# Patient Record
Sex: Female | Born: 1970
Health system: Southern US, Community
[De-identification: ages and names within clinical notes are randomized; demographics above are authoritative.]

## PROBLEM LIST (undated history)

## (undated) ENCOUNTER — Emergency Department (HOSPITAL_COMMUNITY): Payer: 59

## (undated) DIAGNOSIS — M797 Fibromyalgia: Secondary | ICD-10-CM

## (undated) DIAGNOSIS — Z9889 Other specified postprocedural states: Secondary | ICD-10-CM

## (undated) DIAGNOSIS — I10 Essential (primary) hypertension: Secondary | ICD-10-CM

## (undated) DIAGNOSIS — R011 Cardiac murmur, unspecified: Secondary | ICD-10-CM

## (undated) DIAGNOSIS — R87629 Unspecified abnormal cytological findings in specimens from vagina: Secondary | ICD-10-CM

## (undated) DIAGNOSIS — T8859XA Other complications of anesthesia, initial encounter: Secondary | ICD-10-CM

## (undated) DIAGNOSIS — N83209 Unspecified ovarian cyst, unspecified side: Secondary | ICD-10-CM

## (undated) HISTORY — DX: Essential (primary) hypertension: I10

## (undated) HISTORY — PX: CYST EXCISION: SHX5701

## (undated) HISTORY — DX: Unspecified abnormal cytological findings in specimens from vagina: R87.629

## (undated) HISTORY — DX: Unspecified ovarian cyst, unspecified side: N83.209

## (undated) HISTORY — PX: SALPINGECTOMY: SHX328

## (undated) HISTORY — PX: ARTHROSCOPY WITH ANTERIOR CRUCIATE LIGAMENT (ACL) REPAIR WITH ANTERIOR TIBILIAS GRAFT: SHX6503

---

## 2014-10-05 DIAGNOSIS — L80 Vitiligo: Secondary | ICD-10-CM | POA: Insufficient documentation

## 2014-10-05 DIAGNOSIS — I10 Essential (primary) hypertension: Secondary | ICD-10-CM | POA: Insufficient documentation

## 2015-03-11 ENCOUNTER — Encounter (HOSPITAL_COMMUNITY): Payer: Self-pay | Admitting: *Deleted

## 2015-03-11 ENCOUNTER — Inpatient Hospital Stay (HOSPITAL_COMMUNITY)
Admission: AD | Admit: 2015-03-11 | Discharge: 2015-03-11 | Disposition: A | Discharge status: Home / Self Care | Payer: 59 | Source: Ambulatory Visit | Attending: Obstetrics & Gynecology | Admitting: Obstetrics & Gynecology

## 2015-03-11 DIAGNOSIS — N926 Irregular menstruation, unspecified: Secondary | ICD-10-CM | POA: Insufficient documentation

## 2015-03-11 DIAGNOSIS — R109 Unspecified abdominal pain: Secondary | ICD-10-CM | POA: Insufficient documentation

## 2015-03-11 HISTORY — DX: Fibromyalgia: M79.7

## 2015-03-11 LAB — URINALYSIS, ROUTINE W REFLEX MICROSCOPIC
BILIRUBIN URINE: NEGATIVE
Glucose, UA: NEGATIVE mg/dL
Hgb urine dipstick: NEGATIVE
Ketones, ur: 15 mg/dL — AB
Leukocytes, UA: NEGATIVE
NITRITE: NEGATIVE
PH: 6 (ref 5.0–8.0)
Protein, ur: NEGATIVE mg/dL

## 2015-03-11 LAB — POCT PREGNANCY, URINE: Preg Test, Ur: NEGATIVE

## 2015-03-11 MED ORDER — IBUPROFEN 600 MG PO TABS
600.0000 mg | ORAL_TABLET | Freq: Once | ORAL | Status: AC
  Administered 2015-03-11: 600 mg via ORAL
  Filled 2015-03-11: qty 1

## 2015-03-11 NOTE — MAU Provider Note (Signed)
History     CSN: [id number]  Arrival date and time: 03/11/15 1105   First Provider Initiated Contact with Patient 03/11/15 1152      Chief Complaint  Patient presents with  . Flank Pain   HPI   [name] is a 44 y.o. female [phone] with a history of ovarian cysts presents to MAU with left sided "ovarian pain"; the pain has been there for 3-4 months. She was seen by her primary care dr. Three weeks ago and started on birth control pills. The pain is not worsening, however the pain is still present and she feels at this time she needs an [place].   She has not taken anything for pain today, and normally does not like to take pain medication.   Patient is requesting referral to [facility] for follow up.   Patient declines STI testing today.   OB History    Gravida Para Term Preterm AB TAB SAB Ectopic Multiple Living   3 2   1   1  2       Past Medical History  Diagnosis Date  . Fibromyalgia     Past Surgical History  Procedure Laterality Date  . Cyst excision    . Salpingectomy      History reviewed. No pertinent family history.  Social History  Substance Use Topics  . Smoking status: Never Smoker   . Smokeless tobacco: Never Used  . Alcohol Use: No    Allergies:  Allergies  Allergen Reactions  . Erythromycin Rash  . Sulfur Rash    Prescriptions prior to admission  Medication Sig Dispense Refill Last Dose  . cyclobenzaprine (FLEXERIL) 10 MG tablet Take 10 mg by mouth 3 (three) times daily as needed for muscle spasms.   03/10/2015 at Unknown time  . levonorgestrel-ethinyl estradiol (AVIANE,ALESSE,LESSINA) 0.1-20 MG-MCG tablet Take 1 tablet by mouth daily.   03/10/2015 at Unknown time  . metoprolol succinate (TOPROL-XL) 50 MG 24 hr tablet Take 50 mg by mouth daily. Take with or immediately following a meal.   03/10/2015 at Unknown time   Results for orders placed or performed during the hospital encounter of 03/11/15 (from the past 48 hour(s))  Urinalysis,  Routine w reflex microscopic (not at [facility])     Status: Abnormal   Collection Time: 03/11/15 12:02 PM  Result Value Ref Range   Color, Urine YELLOW YELLOW   APPearance CLEAR CLEAR   Specific Gravity, Urine >1.030 (H) 1.005 - 1.030   pH 6.0 5.0 - 8.0   Glucose, UA NEGATIVE NEGATIVE mg/dL   Hgb urine dipstick NEGATIVE NEGATIVE   Bilirubin Urine NEGATIVE NEGATIVE   Ketones, ur 15 (A) NEGATIVE mg/dL   Protein, ur NEGATIVE NEGATIVE mg/dL   Nitrite NEGATIVE NEGATIVE   Leukocytes, UA NEGATIVE NEGATIVE    Comment: MICROSCOPIC NOT DONE ON URINES WITH NEGATIVE PROTEIN, BLOOD, LEUKOCYTES, NITRITE, OR GLUCOSE <1000 mg/dL.  Pregnancy, urine POC     Status: None   Collection Time: 03/11/15 12:27 PM  Result Value Ref Range   Preg Test, Ur NEGATIVE NEGATIVE    Comment:        THE SENSITIVITY OF THIS METHODOLOGY IS >24 mIU/mL     Review of Systems  Constitutional: Negative for fever and chills.  Gastrointestinal: Positive for nausea. Negative for vomiting and abdominal pain.  Musculoskeletal: Positive for back pain (Bilateral, worse on the left side. ).   Physical Exam   Blood pressure 149/79, pulse 76, temperature 98 F (36.7 C), resp. rate 18,  last menstrual period 02/11/2015.  Physical Exam  Constitutional: She is oriented to person, place, and time. She appears well-developed and well-nourished. No distress.  HENT:  Head: Normocephalic.  Respiratory: Effort normal.  GI: Soft. Normal appearance. There is tenderness in the suprapubic area and left lower quadrant. There is no rigidity, no rebound and no guarding.  Musculoskeletal: Normal range of motion.  Neurological: She is alert and oriented to person, place, and time.  Skin: Skin is warm. She is not diaphoretic.  Psychiatric: Her behavior is normal.    MAU Course  Procedures  None  MDM  UA Ibuprofen for pain here in MAU 600 mg  Assessment and Plan   A:  1. Left sided abdominal pain   2. Irregular menstrual cycle     P:  Discharge home in stable condition Out patient pelvic US ordered Return to MAU for emergencies, if symptoms worsen Follow up with PCP Follow up with [facility] for [place] results  Ibuprofen as needed, as directed on the bottle  Lezlie Lye, NP 03/11/2015  11:56 AM

## 2015-03-11 NOTE — MAU Note (Signed)
Pt presents to MAU with complaints of pain in her left lower side. Irregular cycles started in August, pt was started on birth control pills 3 weeks ago.

## 2015-03-11 NOTE — Discharge Instructions (Signed)

## 2015-03-13 ENCOUNTER — Telehealth: Payer: Self-pay

## 2015-03-13 ENCOUNTER — Ambulatory Visit (HOSPITAL_COMMUNITY)
Admission: RE | Admit: 2015-03-13 | Discharge: 2015-03-13 | Disposition: A | Discharge status: Home / Self Care | Payer: 59 | Source: Ambulatory Visit | Attending: Obstetrics and Gynecology | Admitting: Obstetrics and Gynecology

## 2015-03-13 DIAGNOSIS — N926 Irregular menstruation, unspecified: Secondary | ICD-10-CM | POA: Insufficient documentation

## 2015-03-13 DIAGNOSIS — R1032 Left lower quadrant pain: Secondary | ICD-10-CM | POA: Insufficient documentation

## 2015-03-13 DIAGNOSIS — R109 Unspecified abdominal pain: Secondary | ICD-10-CM

## 2015-03-13 DIAGNOSIS — D251 Intramural leiomyoma of uterus: Secondary | ICD-10-CM | POA: Diagnosis not present

## 2015-03-13 NOTE — Telephone Encounter (Signed)
Radiology sent message to contact pt about [place] results.  Re: US showed small fibroid; pt has appt scheduled on 03/23/15.  Called pt and LM to return call to the Clinics.

## 2015-03-14 NOTE — Telephone Encounter (Signed)
Pt has been given the results.

## 2015-03-23 ENCOUNTER — Ambulatory Visit (INDEPENDENT_AMBULATORY_CARE_PROVIDER_SITE_OTHER): Payer: 59 | Admitting: Family Medicine

## 2015-03-23 ENCOUNTER — Encounter: Payer: Self-pay | Admitting: Family Medicine

## 2015-03-23 VITALS — BP 139/81 | HR 81 | Temp 99.1°F | Wt 137.6 lb

## 2015-03-23 DIAGNOSIS — M797 Fibromyalgia: Secondary | ICD-10-CM | POA: Diagnosis not present

## 2015-03-23 DIAGNOSIS — R109 Unspecified abdominal pain: Secondary | ICD-10-CM

## 2015-03-23 DIAGNOSIS — R1032 Left lower quadrant pain: Secondary | ICD-10-CM | POA: Diagnosis not present

## 2015-03-23 DIAGNOSIS — R5383 Other fatigue: Secondary | ICD-10-CM | POA: Diagnosis not present

## 2015-03-23 DIAGNOSIS — Z8742 Personal history of other diseases of the female genital tract: Secondary | ICD-10-CM | POA: Diagnosis not present

## 2015-03-23 MED ORDER — CYCLOBENZAPRINE HCL 10 MG PO TABS: 10.0000 mg | ORAL_TABLET | Freq: Three times a day (TID) | ORAL | Status: DC | PRN

## 2015-03-23 MED ORDER — DULOXETINE HCL 20 MG PO CPEP: 20.0000 mg | ORAL_CAPSULE | Freq: Every day | ORAL | Status: DC

## 2015-03-23 MED ORDER — CYPROHEPTADINE HCL 4 MG PO TABS: 4.0000 mg | ORAL_TABLET | Freq: Every day | ORAL | Status: DC

## 2015-03-23 MED ORDER — METOPROLOL SUCCINATE ER 50 MG PO TB24: 50.0000 mg | ORAL_TABLET | Freq: Every day | ORAL | Status: DC

## 2015-03-23 NOTE — Progress Notes (Signed)
CLINIC ENCOUNTER NOTE  History:  44 y.o. [id number] here today for left lower abdominal discomfort.  Pain/discomfort is located in the LLQ, just above the pelvic rim. Better with pushing on it when it hurts. Denies aggravating factors. She associates this discomfort with ovarian cysts and reports being placed on OCP to help with this. She has tried OCP, narcotics, flexeril without alleviation of sx. She was also started on cymbalta for her pain but only took 1 dose and it made her "feel weird" and she did not take take. She reports she is "not sensitive to typical medications and even the harder stuff like hydro and oxy will not help her pain and so [she] just doesn't like taking medications."  She reports prior episodes of constipation and used to have Bm once weekly but has changed her diet and now has BM every 3 days that are soft.  She denies any abnormal vaginal discharge, pelvic pain or other concerns.  She reports progressively heavier menses with clots the last few months. She was previously a 28d cycle but has had longer and shorter cycles. Mother went through menopause "early." she endorse fatigue and "dizziness" and wonders "if her blood counts are low."  She works a Charity fundraiser at [employer].   Past Medical History  Diagnosis Date  . Fibromyalgia   . Ovarian cyst   . Vaginal Pap smear, abnormal     Past Surgical History  Procedure Laterality Date  . Cyst excision    . Salpingectomy     Family History  Problem Relation Age of Onset  . Breast cancer Maternal Aunt   . ALS Maternal Aunt   . Diabetes    . Hypertension    . Heart attack Father 50  . Colon cancer Neg Hx   . Ovarian cancer Neg Hx   . Uterine cancer Neg Hx   . Cervical cancer Neg Hx    The following portions of the patient's history were reviewed and updated as appropriate: allergies, current medications, past family history, past medical history, past social history, past surgical history and problem list.   Health  Maintenance:  Normal pap and negative HRHPV - within last year.    Review of Systems:  Pertinent items noted in HPI and remainder of comprehensive ROS otherwise negative.  Objective:  Physical Exam BP 139/81 mmHg  Pulse 81  Temp(Src) 99.1 F (37.3 C)  Wt 137 lb 9.6 oz (62.415 kg)  LMP 03/14/2015 CONSTITUTIONAL: Well-developed, well-nourished female in no acute distress.  HENT:  Normocephalic, atraumatic. External right and left ear normal. Oropharynx is clear and moist EYES: Conjunctivae and EOM are normal. Pupils are equal, round, and reactive to light. No scleral icterus.  NECK: Normal range of motion, supple, no masses SKIN: Skin is warm and dry. No rash noted. Not diaphoretic. No erythema. No pallor. [place]: Alert and oriented to person, place, and time. Normal reflexes, muscle tone coordination. No cranial nerve deficit noted. PSYCHIATRIC: Normal mood and affect. Normal behavior. Normal judgment and thought content. CARDIOVASCULAR: Normal heart rate noted RESPIRATORY: Effort and breath sounds normal, no problems with respiration noted ABDOMEN: Soft, no distention noted.  TTP in LLQ, 2cm diameter/radius located just midline of the middle of the left ala. Better with deep palpation. No rebound guarding. Mild TTP in the RLQ (no rebound, guarding). Negative mcburney's. Neg murphy's sign.  PELVIC: Deferred MUSCULOSKELETAL: Normal range of motion. No edema noted.  Labs and Imaging US Transvaginal Non-ob  03/13/2015  CLINICAL DATA:  44 year old  female with left lower quadrant pain for 3-4 months and irregular menstrual cycles. Initiated oral contraception 3 weeks prior. LMP 02/11/2015, day 31 of menstrual cycle. EXAM: TRANSABDOMINAL AND TRANSVAGINAL ULTRASOUND OF PELVIS TECHNIQUE: Both transabdominal and transvaginal ultrasound examinations of the pelvis were performed. Transabdominal technique was performed for global imaging of the pelvis including uterus, ovaries, adnexal regions,  and pelvic cul-de-sac. It was necessary to proceed with endovaginal exam following the transabdominal exam to visualize the endometrium, ovaries and adnexa. COMPARISON:  None FINDINGS: Uterus Measurements: 8.4 x 4.3 x 4.8 cm. The anteverted anteflexed uterus is normal in size and configuration. There is a solitary intramural 1.7 x 1.6 x 1.9 cm fibroid in the left anterior uterine body. Endometrium Thickness: 9 mm. No endometrial cavity fluid or focal endometrial lesion. Right ovary Measurements: 2.8 x 1.2 x 1.8 cm. Normal appearance/no adnexal mass. Left ovary Measurements: 2.1 x 1.8 x 1.5 cm. Normal appearance/no adnexal mass. Other findings No abnormal free fluid.  IMPRESSION:  1. Solitary 1.9 cm left anterior uterine body intramural fibroid.  2. No endometrial abnormality.  3. Normal ovaries.  No adnexal abnormality.  Electronically Signed   By: Ilona Sorrel M.D.   On: 03/13/2015 11:28     Assessment & Plan:  1. History of ovarian cyst- patient was somewhat demanding and desired "some kind of screening for ovarian cancer."  I informed patient that a normal [place] is very good assure against ovarian pathology but she responded "well they usually find it later and I am just saying I here telling you have left ovarian pain and I would feel better if you screened me for ovarian cancer." I explained the nature of screening tests and that there is no screening test for ovarian cancer and patient said "well I know I draw blood tests for ovarian cancer and I want that." I explained the CA 125 is only indicated in certain clinical scenarios which is not the case of her but she insisted on having this test.  - CA 125- patient refused draw here in clinic and will go to [facility] because "she has a friend that is the only person who can drawn her blood"  2. History of abnormal cervical Pap smear -s/p cryotherapy when she was in her 69s, recently "negative" pap within 1 year. No need for pap at this time  3.  Fibromyalgia - Likely many of her sx are due to fibromyalgia and spent time explaining nature of nerver overactivity and use of cymbalta to regulate nerve conduction.  -DULoxetine (CYMBALTA) 20 MG capsule; Take 1 capsule (20 mg total) by mouth daily.  Dispense: 90 capsule; Refill: 3  4. Other fatigue - CBC- patient refused draw here in clinic and will go to [facility]  5. Left lateral abdominal pain Most likely nerve dysfunction related to fibromyalgia given benign abdominal examUnlikely ovarian in the setting of a normal [place] that showed no pathology and fibroids is unlikely the cause of this pain though the patient is fixated on ovarian/fibroid etiology to pain. Doubt intestinal cause and patient has not has any concerning GI sx nor does she have a family h/o colon cancer. Unlikely urinary source given lack of sx and location does not correspond with this.   Routine preventative health maintenance measures emphasized. Please refer to After Visit Summary for other counseling recommendations.   Return in about 4 weeks (around 04/20/2015) for follow side pain.  Total face-to-face time with patient: 45 minutes. Over 50% of encounter was spent  on counseling and coordination of care.

## 2015-03-28 ENCOUNTER — Other Ambulatory Visit: Payer: Self-pay | Admitting: Obstetrics & Gynecology

## 2015-03-28 DIAGNOSIS — Z8742 Personal history of other diseases of the female genital tract: Secondary | ICD-10-CM | POA: Diagnosis not present

## 2015-03-28 DIAGNOSIS — M797 Fibromyalgia: Secondary | ICD-10-CM | POA: Diagnosis not present

## 2015-03-29 ENCOUNTER — Telehealth: Payer: Self-pay | Admitting: General Practice

## 2015-03-29 LAB — CBC
HEMATOCRIT: 39.6 % (ref 34.0–46.6)
Hemoglobin: 12.7 g/dL (ref 11.1–15.9)
MCH: 27.3 pg (ref 26.6–33.0)
MCHC: 32.1 g/dL (ref 31.5–35.7)
MCV: 85 fL (ref 79–97)
PLATELETS: 174 10*3/uL (ref 150–379)
RBC: 4.66 x10E6/uL (ref 3.77–5.28)
RDW: 14.3 % (ref 12.3–15.4)
WBC: 4.2 10*3/uL (ref 3.4–10.8)

## 2015-03-29 LAB — CA 125: CA 125: 16.1 U/mL (ref 0.0–38.1)

## 2015-03-29 NOTE — Telephone Encounter (Signed)
Patient's CA-125 & cbc are normal. Called patient, no answer- left message stating we are trying to reach you with results, please call us back at the clinics

## 2015-03-31 NOTE — Telephone Encounter (Signed)
Virginia Hawkins called again this am and left a message that she is returning our call for results and has missed a few calls. She states if we get her voicemail when we call back we can leave a message with her results.    Called Virginia Hawkins and left a voicemail her Ca-125 and CBC normal, please call office if any questions.

## 2015-03-31 NOTE — Telephone Encounter (Signed)
2nd attempt to contact patient left a message to call patient

## 2015-04-07 ENCOUNTER — Encounter: Payer: Self-pay | Admitting: *Deleted

## 2015-04-11 ENCOUNTER — Telehealth: Payer: Self-pay

## 2015-04-11 NOTE — Telephone Encounter (Signed)
Left message for patient to call us back Per Kim:   Please call patient and let her know    CBC showed normal hemoglobin, she is not anemic    CA125- was negative which makes ovarian pathology unlikely

## 2015-04-12 NOTE — Telephone Encounter (Signed)
Pt has been given results.  °

## 2015-04-25 ENCOUNTER — Other Ambulatory Visit: Payer: Self-pay | Admitting: Family Medicine

## 2015-04-25 MED FILL — CYCLOBENZAPRINE 10 MG TAB: 10 | 30 days supply | Qty: 90 | Fill #0

## 2015-04-25 MED FILL — METOPROLOL SUCC ER 50 MG TA: 50 | 90 days supply | Qty: 90 | Fill #0

## 2015-05-11 ENCOUNTER — Ambulatory Visit (INDEPENDENT_AMBULATORY_CARE_PROVIDER_SITE_OTHER): Payer: 59 | Admitting: Obstetrics & Gynecology

## 2015-05-11 ENCOUNTER — Encounter: Payer: Self-pay | Admitting: Obstetrics & Gynecology

## 2015-05-11 VITALS — BP 124/76 | HR 79 | Temp 98.1°F | Ht 66.0 in | Wt 138.7 lb

## 2015-05-11 DIAGNOSIS — R102 Pelvic and perineal pain: Secondary | ICD-10-CM | POA: Diagnosis not present

## 2015-05-11 DIAGNOSIS — Z113 Encounter for screening for infections with a predominantly sexual mode of transmission: Secondary | ICD-10-CM | POA: Diagnosis not present

## 2015-05-11 DIAGNOSIS — N643 Galactorrhea not associated with childbirth: Secondary | ICD-10-CM

## 2015-05-11 DIAGNOSIS — Z1151 Encounter for screening for human papillomavirus (HPV): Secondary | ICD-10-CM | POA: Diagnosis not present

## 2015-05-11 DIAGNOSIS — Z124 Encounter for screening for malignant neoplasm of cervix: Secondary | ICD-10-CM

## 2015-05-11 DIAGNOSIS — Z1231 Encounter for screening mammogram for malignant neoplasm of breast: Secondary | ICD-10-CM

## 2015-05-11 DIAGNOSIS — N939 Abnormal uterine and vaginal bleeding, unspecified: Secondary | ICD-10-CM

## 2015-05-11 DIAGNOSIS — Z01419 Encounter for gynecological examination (general) (routine) without abnormal findings: Secondary | ICD-10-CM

## 2015-05-11 NOTE — Progress Notes (Signed)
Patient ID: Virginia Hawkins, female   DOB: 1970/05/31, 45 y.o.   MRN: [id number] Subjective:     Virginia Hawkins is a 45 y.o. female here for a routine exam.  Current complaints: bilateral 'ovary pain'.  Pt reports that pain is stabbing in nature.  She has been evaluated for this in the past.  She thinks its related to her fibromyalgia.  She also reports nipple discharge from her left breast.  No pain or nodules felt.  She reports occ HA but, nothing severe.      Gynecologic History Patient's last menstrual period was 04/07/2015. Contraception: tubal ligation Last Pap: she thinks it was 2016.  Recceived a letter from her OB/GYN in [place] that she is due. Results were: h/o abnormal PAP in her 8's   Last mammogram: 2015. Results were: h/o cyst noted in right breast  Obstetric History OB History  Gravida Para Term Preterm AB SAB TAB Ectopic Multiple Living  3 2 2  1   1  2     # Outcome Date GA Lbr Len/2nd Weight Sex Delivery Anes PTL Lv  3 Term 1998 [redacted]w[redacted]d  7 lb (3.175 kg)  Vag-Spont   Y  2 Term 1996 [redacted]w[redacted]d  7 lb (3.175 kg)  Vag-Spont   Y  1 Ectopic              The following portions of the patient's history were reviewed and updated as appropriate: allergies, current medications, past family history, past medical history, past social history, past surgical history and problem list.  Review of Systems Pertinent items are noted in HPI.   02/2016 CLINICAL DATA: 45 year old female with left lower quadrant pain for 3-4 months and irregular menstrual cycles. Initiated oral contraception 3 weeks prior. LMP 02/11/2015, day 31 of menstrual cycle.  EXAM: TRANSABDOMINAL AND TRANSVAGINAL ULTRASOUND OF PELVIS  TECHNIQUE: Both transabdominal and transvaginal ultrasound examinations of the pelvis were performed. Transabdominal technique was performed for global imaging of the pelvis including uterus, ovaries, adnexal regions, and pelvic cul-de-sac. It was necessary to proceed  with endovaginal exam following the transabdominal exam to visualize the endometrium, ovaries and adnexa.  COMPARISON: None  FINDINGS: Uterus  Measurements: 8.4 x 4.3 x 4.8 cm. The anteverted anteflexed uterus is normal in size and configuration. There is a solitary intramural 1.7 x 1.6 x 1.9 cm fibroid in the left anterior uterine body.  Endometrium  Thickness: 9 mm. No endometrial cavity fluid or focal endometrial lesion.  Right ovary  Measurements: 2.8 x 1.2 x 1.8 cm. Normal appearance/no adnexal mass.  Left ovary  Measurements: 2.1 x 1.8 x 1.5 cm. Normal appearance/no adnexal mass.  Other findings  No abnormal free fluid.  IMPRESSION: 1. Solitary 1.9 cm left anterior uterine body intramural fibroid. 2. No endometrial abnormality. 3. Normal ovaries. No adnexal abnormality.  Objective:  BP 124/76 mmHg  Pulse 79  Temp(Src) 98.1 F (36.7 C) (Oral)  Ht 5\' 6"  (1.676 m)  Wt 138 lb 11.2 oz (62.914 kg)  BMI 22.40 kg/m2  LMP 04/07/2015 General Appearance:    Alert, cooperative, no distress, appears stated age  Head:    Normocephalic, without obvious abnormality, atraumatic  Eyes:    conjunctiva/corneas clear, EOM's intact, both eyes  Ears:    Normal external ear canals, both ears  Nose:   Nares normal, septum midline, mucosa normal, no drainage    or sinus tenderness  Throat:   Lips, mucosa, and tongue normal; teeth and gums normal  Neck:   Supple, symmetrical,  trachea midline, no adenopathy;    thyroid:  no enlargement/tenderness/nodules  Back:     Symmetric, no curvature, ROM normal, no CVA tenderness  Lungs:     Clear to auscultation bilaterally, respirations unlabored  Chest Wall:    No tenderness or deformity   Heart:    Regular rate and rhythm, S1 and S2 normal, no murmur, rub   or gallop  Breast Exam:    No tenderness, masses, or nipple abnormality; no nipple discharge noted  Abdomen:     Soft, non-tender, bowel sounds active all four  quadrants,    no masses, no organomegaly  Genitalia:    Normal female without lesion, discharge or tenderness     Extremities:   Extremities normal, atraumatic, no cyanosis or edema  Pulses:   2+ and symmetric all extremities  Skin:   Skin color, texture, turgor normal, no rashes or lesions    Assessment:    Healthy female exam.   Pelvic pain- unknown etiology normal exam and sono.  Pt suspects its from her fibromyalgia galactorrhea   Plan:    Follow up in: 1 year.    Mammogram Labs: TSH and prolactin (pt given Rx as she wanted her labs drawn by someone closer to her home).   F/u PAP and cx- pt declined serum STI screen  Kiernan Atkerson L. Harraway-Smith, M.D., Cherlynn June

## 2015-05-11 NOTE — Progress Notes (Signed)
[facility] @ 1100.  Pt notified of appt and to make sure that she requests/bring in past mammogram results.

## 2015-05-12 LAB — GC/CHLAMYDIA PROBE AMP (~~LOC~~) NOT AT ARMC
CHLAMYDIA, DNA PROBE: NEGATIVE
NEISSERIA GONORRHEA: NEGATIVE

## 2015-05-15 LAB — CYTOLOGY - PAP

## 2015-05-19 DIAGNOSIS — Z79899 Other long term (current) drug therapy: Secondary | ICD-10-CM | POA: Diagnosis not present

## 2015-05-24 MED FILL — traZODone HCL 50 MG TABS: 50 | 30 days supply | Qty: 30 | Fill #0

## 2015-05-25 ENCOUNTER — Ambulatory Visit: Payer: 59

## 2015-06-12 ENCOUNTER — Other Ambulatory Visit: Payer: Self-pay | Admitting: Family Medicine

## 2015-06-12 DIAGNOSIS — Z79899 Other long term (current) drug therapy: Secondary | ICD-10-CM | POA: Diagnosis not present

## 2015-06-12 DIAGNOSIS — Z7689 Persons encountering health services in other specified circumstances: Secondary | ICD-10-CM | POA: Diagnosis not present

## 2015-06-13 LAB — PROLACTIN: Prolactin: 14.9 ng/mL (ref 4.8–23.3)

## 2015-06-13 LAB — TSH: TSH: 1.83 u[IU]/mL (ref 0.450–4.500)

## 2015-06-20 ENCOUNTER — Telehealth: Payer: Self-pay | Admitting: *Deleted

## 2015-06-20 NOTE — Telephone Encounter (Signed)
Pt left message requesting lab results from last week. She stated that a message can be left on her voice mail. I returned pt's call and left message that both of her tests had normal results. I stated the exact results of the TSH and Prolactin levels. She may call back if she has additional questions.

## 2015-07-24 MED FILL — METOPROLOL SUCC ER 50 MG TA: 50 | 90 days supply | Qty: 90 | Fill #1

## 2015-07-24 MED FILL — CYCLOBENZAPRINE 10 MG TAB: 10 | 90 days supply | Qty: 90 | Fill #0

## 2015-07-28 DIAGNOSIS — L659 Nonscarring hair loss, unspecified: Secondary | ICD-10-CM | POA: Diagnosis not present

## 2015-07-28 DIAGNOSIS — L852 Keratosis punctata (palmaris et plantaris): Secondary | ICD-10-CM | POA: Diagnosis not present

## 2015-08-17 MED FILL — FLUOCINONIDE 0.05% CREAM: 0.05 | 30 days supply | Qty: 120 | Fill #0

## 2015-08-17 MED FILL — KETOCONAZOLE 2% FOAM: 2 | 30 days supply | Qty: 100 | Fill #0

## 2015-08-17 MED FILL — UREA 20% CREAM: 20 | 5 days supply | Qty: 85 | Fill #0

## 2015-08-18 DIAGNOSIS — R51 Headache: Secondary | ICD-10-CM | POA: Diagnosis not present

## 2015-09-06 MED FILL — BUTALBITAL COMPOUND CAPSULE: 50-325-40 | 3 days supply | Qty: 12 | Fill #0

## 2015-09-18 DIAGNOSIS — R102 Pelvic and perineal pain: Secondary | ICD-10-CM | POA: Diagnosis not present

## 2015-09-18 DIAGNOSIS — Z01818 Encounter for other preprocedural examination: Secondary | ICD-10-CM | POA: Diagnosis not present

## 2015-10-10 DIAGNOSIS — Z79899 Other long term (current) drug therapy: Secondary | ICD-10-CM | POA: Diagnosis not present

## 2015-10-10 DIAGNOSIS — N8312 Corpus luteum cyst of left ovary: Secondary | ICD-10-CM | POA: Diagnosis not present

## 2015-10-10 DIAGNOSIS — I10 Essential (primary) hypertension: Secondary | ICD-10-CM | POA: Diagnosis not present

## 2015-10-10 DIAGNOSIS — Z882 Allergy status to sulfonamides status: Secondary | ICD-10-CM | POA: Diagnosis not present

## 2015-10-10 DIAGNOSIS — N800 Endometriosis of uterus: Secondary | ICD-10-CM | POA: Diagnosis not present

## 2015-10-10 DIAGNOSIS — N736 Female pelvic peritoneal adhesions (postinfective): Secondary | ICD-10-CM | POA: Diagnosis not present

## 2015-10-10 DIAGNOSIS — M1990 Unspecified osteoarthritis, unspecified site: Secondary | ICD-10-CM | POA: Diagnosis not present

## 2015-10-10 DIAGNOSIS — M797 Fibromyalgia: Secondary | ICD-10-CM | POA: Diagnosis not present

## 2015-10-10 DIAGNOSIS — Z888 Allergy status to other drugs, medicaments and biological substances status: Secondary | ICD-10-CM | POA: Diagnosis not present

## 2015-10-16 DIAGNOSIS — Z882 Allergy status to sulfonamides status: Secondary | ICD-10-CM | POA: Diagnosis not present

## 2015-10-16 DIAGNOSIS — N736 Female pelvic peritoneal adhesions (postinfective): Secondary | ICD-10-CM | POA: Diagnosis not present

## 2015-10-16 DIAGNOSIS — Z888 Allergy status to other drugs, medicaments and biological substances status: Secondary | ICD-10-CM | POA: Diagnosis not present

## 2015-10-16 DIAGNOSIS — I10 Essential (primary) hypertension: Secondary | ICD-10-CM | POA: Diagnosis not present

## 2015-10-16 DIAGNOSIS — M797 Fibromyalgia: Secondary | ICD-10-CM | POA: Diagnosis not present

## 2015-10-16 DIAGNOSIS — M1990 Unspecified osteoarthritis, unspecified site: Secondary | ICD-10-CM | POA: Diagnosis not present

## 2015-10-16 DIAGNOSIS — R102 Pelvic and perineal pain: Secondary | ICD-10-CM | POA: Diagnosis not present

## 2015-10-16 DIAGNOSIS — Z79899 Other long term (current) drug therapy: Secondary | ICD-10-CM | POA: Diagnosis not present

## 2015-10-16 DIAGNOSIS — N800 Endometriosis of uterus: Secondary | ICD-10-CM | POA: Diagnosis not present

## 2015-10-16 DIAGNOSIS — N83202 Unspecified ovarian cyst, left side: Secondary | ICD-10-CM | POA: Diagnosis not present

## 2015-10-16 DIAGNOSIS — N8312 Corpus luteum cyst of left ovary: Secondary | ICD-10-CM | POA: Diagnosis not present

## 2015-10-18 MED FILL — IBUPROFEN 800 MG TABLET: 800 | 15 days supply | Qty: 60 | Fill #0

## 2015-10-18 MED FILL — CYCLOBENZAPRINE 10 MG TAB: 10 | 90 days supply | Qty: 90 | Fill #1

## 2015-10-18 MED FILL — OXYCODONE/APAP 5/325MG: 5-325 | 4 days supply | Qty: 30 | Fill #0

## 2015-10-19 ENCOUNTER — Other Ambulatory Visit: Payer: Self-pay | Admitting: Family Medicine

## 2015-10-25 ENCOUNTER — Telehealth: Payer: Self-pay | Admitting: *Deleted

## 2015-10-25 NOTE — Telephone Encounter (Signed)
Received message left on nurse voicemail on 10/24/15 at 1046.  Patient states her pharmacy has sent over 2 requests for a medication refill on her metoprolol.  States she is out of medication and needs a refill.  Requests a return call to [phone].  Received another message left on nurse voicemail on 10/24/15 at 1426.  Patient states someone from our office called her and she missed the call.  Requests a return call to [phone].

## 2015-10-25 NOTE — Telephone Encounter (Signed)
Spoke with patient and advised her we cannot refill her medication at this time. According to the chart the last time it was fill was 03/20/2015 by our office. I do not see on her problem list that she has Hypertension. I have given patient the number to [facility] and [facility].

## 2015-10-31 MED FILL — METOPROLOL SUCC ER 50 MG TA: 50 | 90 days supply | Qty: 90 | Fill #0

## 2015-11-13 DIAGNOSIS — Z1231 Encounter for screening mammogram for malignant neoplasm of breast: Secondary | ICD-10-CM | POA: Diagnosis not present

## 2015-11-21 DIAGNOSIS — M722 Plantar fascial fibromatosis: Secondary | ICD-10-CM | POA: Diagnosis not present

## 2015-11-21 DIAGNOSIS — J309 Allergic rhinitis, unspecified: Secondary | ICD-10-CM | POA: Diagnosis not present

## 2015-11-21 DIAGNOSIS — I10 Essential (primary) hypertension: Secondary | ICD-10-CM | POA: Diagnosis not present

## 2015-11-21 DIAGNOSIS — M79673 Pain in unspecified foot: Secondary | ICD-10-CM | POA: Diagnosis not present

## 2015-11-21 DIAGNOSIS — G4700 Insomnia, unspecified: Secondary | ICD-10-CM | POA: Diagnosis not present

## 2015-11-21 MED FILL — LEVOCETIRIZINE 5 MG TABLET: 5 | 30 days supply | Qty: 30 | Fill #0

## 2015-11-23 MED FILL — UREA 20% CREAM: 20 | 30 days supply | Qty: 425 | Fill #1

## 2015-11-24 MED FILL — FLUOCINONIDE 0.05% CREAM: 0.05 | 30 days supply | Qty: 120 | Fill #1

## 2015-11-28 DIAGNOSIS — N803 Endometriosis of pelvic peritoneum: Secondary | ICD-10-CM | POA: Diagnosis not present

## 2015-11-28 DIAGNOSIS — D259 Leiomyoma of uterus, unspecified: Secondary | ICD-10-CM | POA: Diagnosis not present

## 2015-11-28 DIAGNOSIS — R102 Pelvic and perineal pain: Secondary | ICD-10-CM | POA: Diagnosis not present

## 2015-11-28 DIAGNOSIS — N951 Menopausal and female climacteric states: Secondary | ICD-10-CM | POA: Diagnosis not present

## 2015-11-29 MED FILL — IBUPROFEN 800 MG TABLET: 800 | 15 days supply | Qty: 60 | Fill #1

## 2015-12-01 MED FILL — MONTELUKAST SOD 10 MG TAB: 10 | 90 days supply | Qty: 90 | Fill #0 | Status: TO

## 2015-12-04 MED FILL — DICLOFENAC SODIUM 1% GEL: 1 | 50 days supply | Qty: 400 | Fill #0

## 2015-12-22 DIAGNOSIS — N926 Irregular menstruation, unspecified: Secondary | ICD-10-CM | POA: Diagnosis not present

## 2016-01-25 MED FILL — CYCLOBENZAPRINE 10 MG TAB: 10 | 90 days supply | Qty: 90 | Fill #2

## 2016-01-25 MED FILL — METOPROLOL SUCC ER 50 MG TA: 50 | 90 days supply | Qty: 90 | Fill #0 | Status: TO

## 2016-01-25 MED FILL — IBUPROFEN 800 MG TABLET: 800 | 15 days supply | Qty: 60 | Fill #2

## 2016-02-19 MED FILL — LEVOCETIRIZINE 5 MG TABLET: 5 | 30 days supply | Qty: 30 | Fill #1

## 2016-03-07 MED FILL — MONTELUKAST SOD 10 MG TAB: 10 | 90 days supply | Qty: 90 | Fill #0

## 2016-04-15 DIAGNOSIS — R079 Chest pain, unspecified: Secondary | ICD-10-CM | POA: Diagnosis not present

## 2016-04-15 DIAGNOSIS — I209 Angina pectoris, unspecified: Secondary | ICD-10-CM | POA: Diagnosis not present

## 2016-04-15 DIAGNOSIS — I10 Essential (primary) hypertension: Secondary | ICD-10-CM | POA: Diagnosis not present

## 2016-04-15 DIAGNOSIS — I4510 Unspecified right bundle-branch block: Secondary | ICD-10-CM | POA: Diagnosis not present

## 2016-04-15 DIAGNOSIS — Z79899 Other long term (current) drug therapy: Secondary | ICD-10-CM | POA: Diagnosis not present

## 2016-04-15 DIAGNOSIS — Z883 Allergy status to other anti-infective agents status: Secondary | ICD-10-CM | POA: Diagnosis not present

## 2016-04-15 DIAGNOSIS — Z882 Allergy status to sulfonamides status: Secondary | ICD-10-CM | POA: Diagnosis not present

## 2016-04-15 DIAGNOSIS — R072 Precordial pain: Secondary | ICD-10-CM | POA: Diagnosis not present

## 2016-04-15 DIAGNOSIS — Z136 Encounter for screening for cardiovascular disorders: Secondary | ICD-10-CM | POA: Diagnosis not present

## 2016-04-15 DIAGNOSIS — K219 Gastro-esophageal reflux disease without esophagitis: Secondary | ICD-10-CM | POA: Diagnosis not present

## 2016-04-15 DIAGNOSIS — M797 Fibromyalgia: Secondary | ICD-10-CM | POA: Diagnosis not present

## 2016-04-15 DIAGNOSIS — Z8249 Family history of ischemic heart disease and other diseases of the circulatory system: Secondary | ICD-10-CM | POA: Diagnosis not present

## 2016-04-15 DIAGNOSIS — R0789 Other chest pain: Secondary | ICD-10-CM | POA: Diagnosis not present

## 2016-04-15 DIAGNOSIS — I160 Hypertensive urgency: Secondary | ICD-10-CM | POA: Diagnosis not present

## 2016-04-24 MED FILL — METOPROLOL SUCC ER 50 MG TA: 50 | 90 days supply | Qty: 90 | Fill #0

## 2016-05-07 ENCOUNTER — Ambulatory Visit (HOSPITAL_BASED_OUTPATIENT_CLINIC_OR_DEPARTMENT_OTHER)
Admission: RE | Admit: 2016-05-07 | Discharge: 2016-05-07 | Disposition: A | Discharge status: Home / Self Care | Payer: 59 | Source: Ambulatory Visit | Attending: Podiatry | Admitting: Podiatry

## 2016-05-07 ENCOUNTER — Ambulatory Visit (INDEPENDENT_AMBULATORY_CARE_PROVIDER_SITE_OTHER): Payer: 59 | Admitting: Podiatry

## 2016-05-07 ENCOUNTER — Encounter: Payer: Self-pay | Admitting: Podiatry

## 2016-05-07 DIAGNOSIS — M722 Plantar fascial fibromatosis: Secondary | ICD-10-CM

## 2016-05-07 DIAGNOSIS — M79672 Pain in left foot: Secondary | ICD-10-CM | POA: Insufficient documentation

## 2016-05-07 DIAGNOSIS — M79671 Pain in right foot: Secondary | ICD-10-CM | POA: Insufficient documentation

## 2016-05-07 DIAGNOSIS — M779 Enthesopathy, unspecified: Secondary | ICD-10-CM | POA: Diagnosis not present

## 2016-05-07 DIAGNOSIS — D3610 Benign neoplasm of peripheral nerves and autonomic nervous system, unspecified: Secondary | ICD-10-CM | POA: Diagnosis not present

## 2016-05-07 DIAGNOSIS — S93401A Sprain of unspecified ligament of right ankle, initial encounter: Secondary | ICD-10-CM

## 2016-05-07 DIAGNOSIS — M25571 Pain in right ankle and joints of right foot: Secondary | ICD-10-CM | POA: Insufficient documentation

## 2016-05-07 MED ORDER — DEXAMETHASONE SODIUM PHOSPHATE 120 MG/30ML IJ SOLN
4.0000 mg | Freq: Once | INTRAMUSCULAR | Status: AC
  Administered 2016-05-07: 4 mg via INTRA_ARTICULAR

## 2016-05-07 MED ORDER — MELOXICAM 15 MG PO TABS: 15.0000 mg | ORAL_TABLET | Freq: Every day | ORAL | 2 refills | Status: DC

## 2016-05-07 MED FILL — MELOXICAM 15 MG TABLET: 15 | 30 days supply | Qty: 30 | Fill #0

## 2016-05-07 NOTE — Patient Instructions (Signed)

## 2016-05-07 NOTE — Progress Notes (Addendum)
Subjective:    Patient ID: Virginia Hawkins, female    DOB: 06-Mar-1971, 46 y.o.   MRN: [id number]  HPI  45 year old female presents to the office today for concerns of pain to her right ankle she points the lateral aspect of the ankle/foot which she gets the majority of symptoms. This has been ongoing for about 2 years and is progressively been getting worse. She also has pain in the arch of her foot into the heels which is worse in the morning. She does work at the hospital doing a lot of walking. She's had no recent treatment for this. She does state that she gets a burning sensation at times to her feet but this is not consistent. She has no other complaints today.  Review of Systems  All other systems reviewed and are negative.      Objective:   Physical Exam General: AAO x3, NAD  Dermatological: Skin is warm, dry and supple bilateral. Nails x 10 are well manicured; remaining integument appears unremarkable at this time. There are no open sores, no preulcerative lesions, no rash or signs of infection present.  Vascular: Dorsalis Pedis artery and Posterior Tibial artery pedal pulses are 2/4 bilateral with immedate capillary fill time. There is no pain with calf compression, swelling, warmth, erythema.   Neruologic: Grossly intact via light touch bilateral. Vibratory intact via tuning fork bilateral. Protective threshold with Semmes Wienstein monofilament intact to all pedal sites bilateral.  Musculoskeletal: Tenderness to palpation along the plantar medial tubercle of the calcaneus at the insertion of plantar fascia on the left foot. There is no pain along the course of the plantar fascia within the arch of the foot at this time but she does get pain to this area as well at times. Plantar fascia appears to be intact. There is no pain with lateral compression of the calcaneus or pain with vibratory sensation. There is no pain along the course or insertion of the achilles tendon.  There is  tenderness on the lateral aspect of the right foot on the sinus tarsi. There is no pain or resection with subtalar joint range of motion. There is no pain on the ankle with ankle range of motion. There is a decrease in medial arch height upon weightbearing the right side worse and left. MMT 5/5. Moderate equinus is present.  Gait: Unassisted, Nonantalgic.     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  -At this time a discussed a steroid injection into the sinus tarsi on the right side for both diagnostic and therapeutic reasons. Understanding risks and complications she wishes to proceed. Under sterile conditions a mixture of dexamethasone and local anesthetic was infiltrated into the area of maximal tennis without complications. Post injection care was discussed. -Prescribed mobic. Discussed side effects of the medication and directed to stop if any are to occur and call the office.  -Stretching, icing exercises daily. -She was scanned for orthotics were sent to [facility] labs. -Discussed supportive shoe gear. -RTC in 3 weeks or sooner if needed  Celesta Gentile, DPM    She wishes to hold off on any steroid injection today -I believe she may benefit more form a CMO. The ones she has are good for the arch but not doing anything for the neuroma or forefoot. She was scanned for them today but  will check insurance coverage before sending -Will start PT -Continue with ice and stretching.  -Follow-up as scheduled or sooner if any problems arise. In the meantime, encouraged to call the office with any questions, concerns, change in symptoms.   Celesta Gentile, DPM Celesta Gentile, DPM

## 2016-05-14 ENCOUNTER — Ambulatory Visit: Payer: 59 | Admitting: Podiatry

## 2016-05-28 ENCOUNTER — Ambulatory Visit (INDEPENDENT_AMBULATORY_CARE_PROVIDER_SITE_OTHER): Payer: 59 | Admitting: Podiatry

## 2016-05-28 DIAGNOSIS — M779 Enthesopathy, unspecified: Secondary | ICD-10-CM | POA: Diagnosis not present

## 2016-05-28 DIAGNOSIS — M722 Plantar fascial fibromatosis: Secondary | ICD-10-CM

## 2016-05-28 MED ORDER — METHYLPREDNISOLONE 4 MG PO TBPK
ORAL_TABLET | ORAL | 0 refills | Status: DC
Start: 2016-05-28 — End: 2016-11-15

## 2016-05-28 MED FILL — METHYLPREDNISOLONE 4 MG TAB: 4 | 6 days supply | Qty: 21 | Fill #0

## 2016-05-28 MED FILL — MONTELUKAST SOD 10 MG TAB: 10 | 90 days supply | Qty: 90 | Fill #1

## 2016-05-28 NOTE — Patient Instructions (Signed)

## 2016-06-03 NOTE — Progress Notes (Signed)
Subjective: Ms. Abdullah presents the office today to pick up orthotics and for follow-up evaluation of bilateral foot pain. She states that her right foot on the outside hurts and she points to the fifth metatarsal base which is the majority of symptoms. No numbness or tingling.  Denies any systemic complaints such as fevers, chills, nausea, vomiting. No acute changes since last appointment, and no other complaints at this time.   Objective: AAO x3, NAD DP/PT pulses palpable bilaterally, CRT less than 3 seconds There is tenderness to the right foot along the 5th metatarsal base along the insertion of the peroneal tendon. There is no pain along the proximal tendon. There is minimal tenderness along the plantar medial tubercle of the calcaneus at the insertion of the plantar fascia. There is no pain with lateral compression of the calcaneus. There is no other areas of tenderness of the foot at this time.  No open lesions or pre-ulcerative lesions.  No pain with calf compression, swelling, warmth, erythema  Assessment: Right foot tendonitis, resolving plantar fasciitis  Plan: -All treatment options discussed with the patient including all alternatives, risks, complications.  -Discussed a steroid injection for the right foot. Will hold off today but if symptoms continue will do next appointment. Medrol dose pack prescribed.  -Orthotics were dispensed today. Oral and written break in instructions were discussed. Encouraged to call if there are any issues with them and we can send them back for modifications.  -Discussed more supportive shoegear.  -Ice -Stretching daily -RTC 3-4 weeks or sooner if needed.  -Patient encouraged to call the office with any questions, concerns, change in symptoms.   Celesta Gentile, DPM

## 2016-06-04 DIAGNOSIS — R109 Unspecified abdominal pain: Secondary | ICD-10-CM | POA: Diagnosis not present

## 2016-06-04 DIAGNOSIS — K625 Hemorrhage of anus and rectum: Secondary | ICD-10-CM | POA: Diagnosis not present

## 2016-06-04 DIAGNOSIS — R1013 Epigastric pain: Secondary | ICD-10-CM | POA: Diagnosis not present

## 2016-06-04 MED FILL — raNITIdine HCL 150 MG TABS: 150 | 30 days supply | Qty: 60 | Fill #0

## 2016-06-05 DIAGNOSIS — M797 Fibromyalgia: Secondary | ICD-10-CM | POA: Diagnosis not present

## 2016-06-05 MED FILL — DULoxetine HCL 60 MG CPEP: 60 | 90 days supply | Qty: 90 | Fill #0

## 2016-06-05 MED FILL — CYCLOBENZAPRINE 10 MG TAB: 10 | 30 days supply | Qty: 60 | Fill #0

## 2016-06-07 ENCOUNTER — Telehealth: Payer: Self-pay | Admitting: *Deleted

## 2016-06-07 DIAGNOSIS — M779 Enthesopathy, unspecified: Secondary | ICD-10-CM

## 2016-06-07 DIAGNOSIS — M722 Plantar fascial fibromatosis: Secondary | ICD-10-CM

## 2016-06-07 DIAGNOSIS — M792 Neuralgia and neuritis, unspecified: Secondary | ICD-10-CM

## 2016-06-07 NOTE — Telephone Encounter (Signed)
Pt states her ankle was okay until started wear the orthotics a 1 week ago and orthotics are cut short and hurt the toes. I spoke with pt and told her to stop the orthotics and continue the Mobic and I will transfer her to schedule with the pedorthist, to bring her orthotics and the shoes she wear routinely. 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

## 2016-06-11 ENCOUNTER — Other Ambulatory Visit: Payer: 59

## 2016-06-14 DIAGNOSIS — R10816 Epigastric abdominal tenderness: Secondary | ICD-10-CM | POA: Diagnosis not present

## 2016-06-18 ENCOUNTER — Other Ambulatory Visit: Payer: 59

## 2016-06-18 MED FILL — SUPREP BOWEL PREP KIT: 17.5-3.13-1 | 2 days supply | Qty: 354 | Fill #0

## 2016-06-21 ENCOUNTER — Encounter (HOSPITAL_COMMUNITY): Payer: Self-pay | Admitting: Adult Health

## 2016-06-21 ENCOUNTER — Emergency Department (HOSPITAL_COMMUNITY)
Admission: EM | Admit: 2016-06-21 | Discharge: 2016-06-21 | Disposition: A | Discharge status: Home / Self Care | Payer: 59 | Attending: Emergency Medicine | Admitting: Emergency Medicine

## 2016-06-21 DIAGNOSIS — S199XXA Unspecified injury of neck, initial encounter: Secondary | ICD-10-CM | POA: Diagnosis not present

## 2016-06-21 DIAGNOSIS — Y999 Unspecified external cause status: Secondary | ICD-10-CM | POA: Diagnosis not present

## 2016-06-21 DIAGNOSIS — Y92410 Unspecified street and highway as the place of occurrence of the external cause: Secondary | ICD-10-CM | POA: Insufficient documentation

## 2016-06-21 DIAGNOSIS — M542 Cervicalgia: Secondary | ICD-10-CM

## 2016-06-21 DIAGNOSIS — Y9389 Activity, other specified: Secondary | ICD-10-CM | POA: Diagnosis not present

## 2016-06-21 MED ORDER — NAPROXEN 250 MG PO TABS: 250.0000 mg | ORAL_TABLET | Freq: Two times a day (BID) | ORAL | 0 refills | Status: DC

## 2016-06-21 MED ORDER — METHOCARBAMOL 500 MG PO TABS: 500.0000 mg | ORAL_TABLET | Freq: Two times a day (BID) | ORAL | 0 refills | Status: DC | PRN

## 2016-06-21 MED FILL — METHOCARBAMOL 500 MG TABLET: 500 | 10 days supply | Qty: 20 | Fill #0
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
## 2016-06-25 ENCOUNTER — Ambulatory Visit: Payer: 59 | Admitting: Podiatry

## 2016-06-25 DIAGNOSIS — R109 Unspecified abdominal pain: Secondary | ICD-10-CM | POA: Diagnosis not present

## 2016-06-25 DIAGNOSIS — K625 Hemorrhage of anus and rectum: Secondary | ICD-10-CM | POA: Diagnosis not present

## 2016-07-02 ENCOUNTER — Encounter: Payer: Self-pay | Admitting: Podiatry

## 2016-07-02 ENCOUNTER — Ambulatory Visit (INDEPENDENT_AMBULATORY_CARE_PROVIDER_SITE_OTHER): Payer: 59 | Admitting: Podiatry

## 2016-07-02 DIAGNOSIS — M722 Plantar fascial fibromatosis: Secondary | ICD-10-CM | POA: Diagnosis not present

## 2016-07-02 DIAGNOSIS — M792 Neuralgia and neuritis, unspecified: Secondary | ICD-10-CM | POA: Diagnosis not present

## 2016-07-02 DIAGNOSIS — M779 Enthesopathy, unspecified: Secondary | ICD-10-CM

## 2016-07-02 MED ORDER — DICLOFENAC SODIUM 75 MG PO TBEC: 75.0000 mg | DELAYED_RELEASE_TABLET | Freq: Two times a day (BID) | ORAL | 0 refills | Status: DC

## 2016-07-02 MED FILL — DICLOFENAC SOD 75 MG TAB EC: 75 | 15 days supply | Qty: 30 | Fill #0

## 2016-07-03 NOTE — Telephone Encounter (Addendum)
-----   Message from Trula Slade, DPM sent at 07/02/2016 11:07 AM EDT ----- Can you please put in for PT for her? I think [facility] has a rehab in [place] or [place]. It is for peroneal tendonitis/plantar fasciitis. 07/03/2016-Informed [facility] of the referral. Orders faxed to [facility].07/08/2016-Pt states she has received a call for PT, but none for the nerve conduction study. I spoke with [facility] and she states put in as Referral to Dr. Rochele Raring for NCV with EMG. I reordered as instructed and faxed to [facility].

## 2016-07-03 NOTE — Progress Notes (Signed)
Subjective: 46 year old female presents the office today for follow-up evaluation of continued foot pain. She brought in a diagram today for feet on a daily basis of or they hurt. She states that she stands 12 hours time at work walking on hard surfaces. She also states that she feels that she is walking on marbles on the bottom of her feet and the arch of the foot as well as having some sharp pains to her feet. She states that she were to have a nerve conduction test. She says the pain is not worse that she is been about the same. She did get orthotics have they're not fitting appropriately set she is awaiting new inserts.She has no other complaints today. Denies any systemic complaints such as fevers, chills, nausea, vomiting. No acute changes since last appointment, and no other complaints at this time.   Objective: AAO x3, NAD DP/PT pulses palpable bilaterally, CRT less than 3 seconds There is a decrease in medial arch height upon weightbearing. There is currently minimal tenderness palpation to her feet but she has not been on her feet today. Per her diagram and her the majority pain seems to be the fifth metatarsal base is on both foot as well as the arch of the foot. She has some occasional pain to the insertion of the plantar fashion the bottom of the foot along the calcaneus but not today. Equinus is present. There is no overlying edema, erythema, increase in warmth. There is no area pinpoint bony tenderness or pain the vibratory sensation. Negative tinel sign.  No open lesions or pre-ulcerative lesions.  No pain with calf compression, swelling, warmth, erythema  Assessment: Arch pain/plantar fasciitis, peroneal tendinitis likely result of biomechanical changes due to flatfoot.  Plan: -All treatment options discussed with the patient including all alternatives, risks, complications.  -At this time given her continued symptoms recommended physical therapy. This is ordered today for her. -Also  although her symptoms while he did not neurological origin she is requesting nerve conduction test and I agreed to it to make sure given her sharp pains to her foot. Nerve conduction test was ordered today. She doe shave bilateral foot neuritis symptoms. This could also be a result of biomechanical changes.  -Her inserts have arrived however they at [facility] office. She is in good the office because of today. -Voltaren prescribed. Stop other anti-inflammatories.  -Patient encouraged to call the office with any questions, concerns, change in symptoms.   Celesta Gentile, DPM

## 2016-07-03 NOTE — Telephone Encounter (Signed)
-----   Message from Trula Slade, DPM sent at 07/03/2016 10:58 AM EDT ----- Can you order a NCV for her please? She can do it on a Tuesday or Friday. Thanks.

## 2016-07-09 ENCOUNTER — Encounter: Payer: Self-pay | Admitting: Neurology

## 2016-07-09 ENCOUNTER — Other Ambulatory Visit: Payer: 59

## 2016-07-09 ENCOUNTER — Other Ambulatory Visit: Payer: Self-pay | Admitting: *Deleted

## 2016-07-09 DIAGNOSIS — M792 Neuralgia and neuritis, unspecified: Secondary | ICD-10-CM

## 2016-07-15 ENCOUNTER — Encounter: Payer: Self-pay | Admitting: General Practice

## 2016-07-26 ENCOUNTER — Ambulatory Visit (HOSPITAL_COMMUNITY): Payer: 59 | Attending: Podiatry | Admitting: Physical Therapy

## 2016-07-26 DIAGNOSIS — R262 Difficulty in walking, not elsewhere classified: Secondary | ICD-10-CM | POA: Insufficient documentation

## 2016-07-26 DIAGNOSIS — M25571 Pain in right ankle and joints of right foot: Secondary | ICD-10-CM | POA: Insufficient documentation

## 2016-07-26 DIAGNOSIS — M25572 Pain in left ankle and joints of left foot: Secondary | ICD-10-CM | POA: Insufficient documentation

## 2016-07-26 NOTE — Patient Instructions (Signed)
Gastroc Stretch    Stand with right foot back, leg straight, forward leg bent. Keeping heel on floor, turned slightly out, lean into wall until stretch is felt in calf. Hold _30___ seconds. Repeat _3___ times per set. Do __1__ sets per session. Do __3__ sessions per day.  http://orth.exer.us/26   Copyright  VHI. All rights reserved.  Plantar Fascia Stretch    Standing with only ball of left foot on stair, push heel down until stretch is felt through arch of foot. Hold _30___ seconds. Relax. Repeat ___3_ times per set. Do __1__ sets per session. Do __3__ sessions per day.  http://orth.exer.us/22   Copyright  VHI. All rights reserved.

## 2016-07-26 NOTE — Therapy (Addendum)
[place], [place], [address] Phone: [phone]   Fax:  [phone]  Physical Therapy Evaluation  Patient Details  Name: Virginia Hawkins MRN: [id number] Date of Birth: 23-Mar-1971 Referring Provider: Erling Conte   Encounter Date: 07/26/2016      PT End of Session - 07/26/16 1208    Visit Number 1  PT request one time a week    Number of Visits 4   Date for PT Re-Evaluation 08/25/16   Authorization Type [employer]   Authorization - Visit Number 1   Authorization - Number of Visits 4   PT Start Time 1000  PT 15 minutes late    PT Stop Time 1035   PT Time Calculation (min) 35 min   Activity Tolerance Patient tolerated treatment well   Behavior During Therapy [facility] for tasks assessed/performed      Past Medical History:  Diagnosis Date  . Fibromyalgia   . Ovarian cyst   . Vaginal Pap smear, abnormal     Past Surgical History:  Procedure Laterality Date  . CYST EXCISION    . SALPINGECTOMY      There were no vitals filed for this visit.       Subjective Assessment - 07/26/16 1011    Subjective Virginia Hawkins states that she began having increased pain in her Achilles tendon in November.  She has had anti-inflamatory, IP and HP, as well as exercises without relief.   She was given orthotics for three months now which has given her about 20% relief.  she is now being referred for skilled physcical therapy.    How long can you stand comfortably? able to stand for 10-15    How long can you walk comfortably? walking 5-10 minutes    Patient Stated Goals to be able to walk/ stand longer and to be able to sleep without being woke up.  Pt wakes up 5 times out of a week due to her heel pain    Currently in Pain? Yes   Pain Score 9    Pain Location Heel   Pain Orientation Right;Left   Pain Descriptors / Indicators Aching;Burning   Pain Type Chronic pain   Pain Onset More than a month ago   Pain  Frequency Constant   Aggravating Factors  walking and standing    Pain Relieving Factors not sure.     Effect of Pain on Daily Activities increases             [facility] PT Assessment - 07/26/16 0001      Assessment   Medical Diagnosis B plantar fascitis    Referring Provider Erling Conte    Onset Date/Surgical Date 02/06/17   Next MD Visit 08/13/2016   Prior Therapy none for this problem      Precautions   Precautions None     Balance Screen   Has the patient fallen in the past 6 months No   Has the patient had a decrease in activity level because of a fear of falling?  Yes   Is the patient reluctant to leave their home because of a fear of falling?  No     Home Environment   Living Environment Private residence   Home Access Stairs to enter   [place] Two level   Additional Comments unable to go up and down in a reciprocal manner      Prior Function   Level of Independence Independent  Vocation Full time employment   Vocation Requirements on her feet for 12 hour days    Leisure tm, bike      Cognition   Overall Cognitive Status Within Functional Limits for tasks assessed     Observation/Other Assessments   Focus on Therapeutic Outcomes (FOTO)  30     ROM / Strength   AROM / PROM / Strength AROM;Strength     AROM   AROM Assessment Site Ankle   Right/Left Ankle Right;Left   Right Ankle Dorsiflexion 9   Right Ankle Plantar Flexion 55   Right Ankle Inversion 20   Right Ankle Eversion 8   Left Ankle Dorsiflexion 5   Left Ankle Plantar Flexion 40   Left Ankle Inversion 20   Left Ankle Eversion 10     Strength   Strength Assessment Site Ankle   Right/Left Ankle Right;Left   Right Ankle Dorsiflexion 4/5   Right Ankle Plantar Flexion 4/5   Right Ankle Inversion 4-/5   Right Ankle Eversion 4-/5   Left Ankle Dorsiflexion 4/5   Left Ankle Plantar Flexion 4-/5   Left Ankle Inversion 4-/5   Left Ankle Eversion 4/5                            PT Education - 07/26/16 1221    Education provided Yes   Education Details HEP; friction massage for tendon   Person(s) Educated Patient   Methods Explanation;Handout;Verbal cues   Comprehension Verbalized understanding          PT Short Term Goals - 07/26/16 1215      PT SHORT TERM GOAL #1   Title Pt pain in both heels to be  to be decreased tono greater than  a 5/10 to allow pt to walk for 30 minutes in comfort.   Time 2   Period Weeks   Status New     PT SHORT TERM GOAL #2   Title Pt to be able to stand for 30 minutes in comfort   Time 2   Period Weeks   Status New     PT SHORT TERM GOAL #3   Title Pt to be able to get out of a car without increased pain.    Time 2   Period Weeks   Status New           PT Long Term Goals - 07/26/16 1217      PT LONG TERM GOAL #1   Title Pt bilateral heel pain to be no greater than a 2/10 to allow pt to walk for up to an hour in comfort for work and shopping activity.    Time 4   Period Weeks   Status New     PT LONG TERM GOAL #2   Title Pt to be able to complete heavy housework at home without having increased heel pain.    Time 4   Status New     PT LONG TERM GOAL #3   Title Pt to be able to go up and down steps in a reciprocal manner without difficulty    Time 4   Period Weeks   Status New               Plan - 07/26/16 1209    Clinical Impression Statement Virginia Hawkins is a 46 yo female who has had progressive heel pain bilaterally since November.  She has been doing exercises that her MD  gave her, has taken antiinflammatory medication and has had an injection in her right achilles with minimal improvement.  She is now being referred to skilled physical therapy.  Evaluation demonstrates decreased ROM, decreased strength, difficulty walking and increased pain.  Virginia Hawkins will benefit from skilled physical therapy to address these issues and maximize her functional ability.    Rehab Potential Good   PT  Frequency 2x / week  PT recommends 2x a week pt requested one    PT Duration 4 weeks   PT Treatment/Interventions Ultrasound;Manual techniques;Patient/family education;Therapeutic exercise;Therapeutic activities   PT Next Visit Plan begin slant board stretch, isometrics for all but plantarflexion as well as manual to B gastroc soleus complex.  Give isometrics for HEP, progress to standing toe raises.  See if pt is using or has used night splints.    PT Home Exercise Plan gastroc and plantar stretches      Patient will benefit from skilled therapeutic intervention in order to improve the following deficits and impairments:  Decreased activity tolerance, Decreased strength, Difficulty walking, Increased fascial restricitons, Impaired flexibility, Decreased range of motion, Pain  Visit Diagnosis: Pain in right ankle and joints of right foot - Plan: PT plan of care cert/re-cert  Pain in left ankle and joints of left foot - Plan: PT plan of care cert/re-cert  Difficulty in walking, not elsewhere classified - Plan: PT plan of care cert/re-cert     Problem List Patient Active Problem List   Diagnosis Date Noted  . History of ovarian cyst 03/23/2015  . History of abnormal cervical Pap smear 03/23/2015  . Fibromyalgia 03/23/2015    Rayetta Humphrey, PT CLT [phone] 07/26/2016, 12:23 PM  [place] [address] [place], [place], [address] Phone: [phone]   Fax:  [phone]  Name: Virginia Hawkins MRN: [id number] Date of Birth: Sep 17, 1970   09/03/2016  PHYSICAL THERAPY DISCHARGE SUMMARY  Visits from Start of Care: 1 Pt cancelled all future visits  Current functional level related to goals / functional outcomes: See above   Remaining deficits: See above    Education / Equipment: As above  Plan: Patient agrees to discharge.  Patient goals were not met. Patient is being discharged due to not returning since the last visit.   ?????        Rayetta Humphrey, [place] CLT [phone]

## 2016-07-30 ENCOUNTER — Ambulatory Visit (INDEPENDENT_AMBULATORY_CARE_PROVIDER_SITE_OTHER): Payer: 59 | Admitting: Neurology

## 2016-07-30 DIAGNOSIS — R202 Paresthesia of skin: Secondary | ICD-10-CM | POA: Diagnosis not present

## 2016-07-30 DIAGNOSIS — M792 Neuralgia and neuritis, unspecified: Secondary | ICD-10-CM

## 2016-07-30 DIAGNOSIS — Z13220 Encounter for screening for lipoid disorders: Secondary | ICD-10-CM | POA: Diagnosis not present

## 2016-07-30 DIAGNOSIS — R200 Anesthesia of skin: Secondary | ICD-10-CM | POA: Diagnosis not present

## 2016-07-30 DIAGNOSIS — Z0000 Encounter for general adult medical examination without abnormal findings: Secondary | ICD-10-CM | POA: Diagnosis not present

## 2016-07-30 DIAGNOSIS — I10 Essential (primary) hypertension: Secondary | ICD-10-CM | POA: Diagnosis not present

## 2016-07-30 DIAGNOSIS — J301 Allergic rhinitis due to pollen: Secondary | ICD-10-CM | POA: Diagnosis not present

## 2016-07-30 DIAGNOSIS — Z1231 Encounter for screening mammogram for malignant neoplasm of breast: Secondary | ICD-10-CM | POA: Diagnosis not present

## 2016-07-30 MED FILL — FLUTICASONE PROP 50 MCG SPR: 50 | 30 days supply | Qty: 16 | Fill #0

## 2016-07-30 MED FILL — METOPROLOL SUCC ER 50 MG TA: 50 | 90 days supply | Qty: 90 | Fill #0

## 2016-07-30 NOTE — Procedures (Signed)
[facility]  [place], [address]  [place], [place] [address] Tel: [phone] Fax:  [phone] Test Date:  07/30/2016  Patient: Virginia Hawkins DOB: 1970/04/29 Physician: Narda Amber, DO  Sex: Female Height: 5\' 6"  Ref Phys: Celesta Gentile, DPM  ID#: [id number] Temp: 34.4C Technician:    Patient Complaints: This is a 46 year-old female referred for evaluation of bilateral feet pain.  NCV & EMG Findings: Extensive electrodiagnostic testing of the right lower extremity and additional studies of the left shows:  1. Bilateral sural and superficial peroneal sensory responses are within normal limits. 2. Bilateral peroneal and tibial motor responses are within normal limits. 3. Bilateral tibial H reflex studies were within normal limits. 4. There is no evidence of active or chronic motor axon loss changes affecting any of the tested muscles. Motor unit configuration and recruitment pattern is within normal limits.  Impression: This is a normal study. In particular, there is no evidence of a generalized sensorimotor polyneuropathy or lumbosacral radiculopathy affecting the lower extremities.    ___________________________ Narda Amber, DO    Nerve Conduction Studies Anti Sensory Summary Table   Site NR Peak (ms) Norm Peak (ms) P-T Amp (V) Norm P-T Amp  Left Sup Peroneal Anti Sensory (Ant Lat Mall)  34.4C  12 cm    2.7 <4.5 14.2 >5  Right Sup Peroneal Anti Sensory (Ant Lat Mall)  34.4C  12 cm    2.6 <4.5 14.3 >5  Left Sural Anti Sensory (Lat Mall)  34.4C  Calf    3.8 <4.5 26.0 >5  Right Sural Anti Sensory (Lat Mall)  34.4C  Calf    3.0 <4.5 20.4 >5   Motor Summary Table   Site NR Onset (ms) Norm Onset (ms) O-P Amp (mV) Norm O-P Amp Site1 Site2 Delta-0 (ms) Dist (cm) Vel (m/s) Norm Vel (m/s)  Left Peroneal Motor (Ext Dig Brev)  34.4C  Ankle    3.5 <5.5 5.3 >3 B Fib Ankle 6.4 33.0 52 >40  B Fib    9.9  5.0  Poplt B Fib 2.0 10.0 50 >40  Poplt    11.9   4.7         Right Peroneal Motor (Ext Dig Brev)  34.4C  Ankle    3.2 <5.5 6.4 >3 B Fib Ankle 6.5 38.0 58 >40  B Fib    9.7  6.3  Poplt B Fib 1.8 10.0 56 >40  Poplt    11.5  6.1         Left Tibial Motor (Abd Hall Brev)  34.4C  Ankle    4.1 <6.0 9.7 >8 Knee Ankle 7.1 38.0 54 >40  Knee    11.2  5.8         Right Tibial Motor (Abd Hall Brev)  34.4C  Ankle    3.1 <6.0 8.3 >8 Knee Ankle 7.8 38.0 49 >40  Knee    10.9  7.2          H Reflex Studies   NR H-Lat (ms) Lat Norm (ms) L-R H-Lat (ms)  Left Tibial (Gastroc)  34.4C     31.02 <35 0.00  Right Tibial (Gastroc)  34.4C     31.02 <35 0.00   EMG   Side Muscle Ins Act Fibs Psw Fasc Number Recrt Dur Dur. Amp Amp. Poly Poly. Comment  Right AntTibialis Nml Nml Nml Nml Nml Nml Nml Nml Nml Nml Nml Nml N/A  Right Gastroc Nml Nml Nml Nml Nml Nml Nml  Nml Nml Nml Nml Nml N/A  Right Flex Dig Long Nml Nml Nml Nml Nml Nml Nml Nml Nml Nml Nml Nml N/A  Right RectFemoris Nml Nml Nml Nml Nml Nml Nml Nml Nml Nml Nml Nml N/A  Right GluteusMed Nml Nml Nml Nml Nml Nml Nml Nml Nml Nml Nml Nml N/A  Left AntTibialis Nml Nml Nml Nml Nml Nml Nml Nml Nml Nml Nml Nml N/A  Left Gastroc Nml Nml Nml Nml Nml Nml Nml Nml Nml Nml Nml Nml N/A  Left Flex Dig Long Nml Nml Nml Nml Nml Nml Nml Nml Nml Nml Nml Nml N/A  Left RectFemoris Nml Nml Nml Nml Nml Nml Nml Nml Nml Nml Nml Nml N/A  Left GluteusMed Nml Nml Nml Nml Nml Nml Nml Nml Nml Nml Nml Nml N/A      Waveforms:

## 2016-08-13 ENCOUNTER — Ambulatory Visit: Payer: 59 | Admitting: Podiatry

## 2016-08-13 ENCOUNTER — Ambulatory Visit (HOSPITAL_COMMUNITY): Payer: 59 | Admitting: Physical Therapy

## 2016-08-20 ENCOUNTER — Ambulatory Visit (HOSPITAL_COMMUNITY): Payer: 59

## 2016-08-20 ENCOUNTER — Telehealth (HOSPITAL_COMMUNITY): Payer: Self-pay | Admitting: General Practice

## 2016-08-20 NOTE — Telephone Encounter (Signed)
08/20/16 pt left a message to cx appt but couldn't understand what the reason was

## 2016-08-21 MED FILL — MONTELUKAST SOD 10 MG TAB: 10 | 90 days supply | Qty: 90 | Fill #2

## 2016-08-27 ENCOUNTER — Ambulatory Visit (HOSPITAL_COMMUNITY): Payer: 59 | Admitting: Physical Therapy

## 2016-08-27 ENCOUNTER — Telehealth (HOSPITAL_COMMUNITY): Payer: Self-pay | Admitting: General Practice

## 2016-08-27 DIAGNOSIS — K5900 Constipation, unspecified: Secondary | ICD-10-CM | POA: Diagnosis not present

## 2016-08-27 DIAGNOSIS — R109 Unspecified abdominal pain: Secondary | ICD-10-CM | POA: Diagnosis not present

## 2016-08-27 DIAGNOSIS — R1013 Epigastric pain: Secondary | ICD-10-CM | POA: Diagnosis not present

## 2016-08-27 MED FILL — POLYETHYLENE GLYCOL 3350 PO: 30 days supply | Qty: 1054 | Fill #0

## 2016-08-27 NOTE — Telephone Encounter (Signed)
08/27/16 pt called to cancel and asked that we just cx the appt. But no reason was given

## 2016-09-03 ENCOUNTER — Ambulatory Visit (HOSPITAL_COMMUNITY): Payer: 59 | Admitting: Physical Therapy

## 2016-09-03 ENCOUNTER — Telehealth (HOSPITAL_COMMUNITY): Payer: Self-pay | Admitting: General Practice

## 2016-09-03 ENCOUNTER — Ambulatory Visit: Payer: 59 | Admitting: Podiatry

## 2016-09-03 NOTE — Telephone Encounter (Signed)
09/03/16 pt left a message on 6/11 to cx her appt then she said to just cancel any future appts that she might have.  She said at some point she will call back to rescehdule.  No reason was given.

## 2016-09-20 ENCOUNTER — Ambulatory Visit (INDEPENDENT_AMBULATORY_CARE_PROVIDER_SITE_OTHER): Payer: 59 | Admitting: Podiatry

## 2016-09-20 ENCOUNTER — Encounter: Payer: Self-pay | Admitting: Podiatry

## 2016-09-20 DIAGNOSIS — M722 Plantar fascial fibromatosis: Secondary | ICD-10-CM | POA: Diagnosis not present

## 2016-09-20 DIAGNOSIS — M779 Enthesopathy, unspecified: Secondary | ICD-10-CM | POA: Diagnosis not present

## 2016-09-20 MED ORDER — UREA 40 % EX CREA
1.0000 | TOPICAL_CREAM | Freq: Every day | CUTANEOUS | 0 refills | Status: DC
Start: 2016-09-20 — End: 2019-03-08

## 2016-09-20 MED ORDER — METHYLPREDNISOLONE 4 MG PO TBPK: ORAL_TABLET | ORAL | 0 refills | Status: DC

## 2016-09-20 MED FILL — METHYLPREDNISOLONE 4 MG TAB: 4 | 6 days supply | Qty: 21 | Fill #0

## 2016-09-20 MED FILL — UREA 40% CREAM: 40 | 30 days supply | Qty: 198 | Fill #0

## 2016-10-01 ENCOUNTER — Telehealth: Payer: Self-pay | Admitting: *Deleted

## 2016-10-01 NOTE — Telephone Encounter (Signed)
Called patient to see how she was doing per Dr Jacqualyn Posey and left a message to call me back tomorrow in the [place] at [phone]. Lattie Haw

## 2016-10-04 ENCOUNTER — Telehealth: Payer: Self-pay | Admitting: *Deleted

## 2016-10-04 NOTE — Progress Notes (Signed)
Subjective: 46 year old female presents the office today for follow-up evaluation of continued foot pain. She states the inserts that she had were helping but they're no longer helping. She states the left foot still hurts when do not standing as well as pain to the right foot as well. She's gained frustrated with the pain that she is having. She denies any numbness or tingling today. Denies any swelling or redness. Pain is mostly with standing of the day. She did not bring her inserts with her today. Denies any systemic complaints such as fevers, chills, nausea, vomiting. No acute changes since last appointment, and no other complaints at this time.   Objective: AAO x3, NAD DP/PT pulses palpable bilaterally, CRT less than 3 seconds There is a decrease in medial arch height upon weightbearing. There is continued tenderness to palpation to her feet. She continues to get pain in the arch of the feet bilaterally as well as the fifth metatarsal base bilaterally. She also states that she gets pain to her heel. There is no amount edema, erythema, increase in warmth. Ankle, subtalar joint range of motion intact. There is no overlying edema, erythema, increase in warmth. No open lesions or pre-ulcerative lesions.  No pain with calf compression, swelling, warmth, erythema  Assessment: Arch pain/plantar fasciitis, peroneal tendinitis likely result of biomechanical changes due to flatfoot.  Plan: -All treatment options discussed with the patient including all alternatives, risks, complications.  -At this point given her continued symptoms have recommended MRI bilaterally. This is ordered. -Recommended physical therapy. -Continue supportive shoes. -Apparently orthotics for fitting well. Will have her come back and see Liliane Channel for evaluation. -Patient encouraged to call the office with any questions, concerns, change in symptoms.   Celesta Gentile, DPM

## 2016-10-04 NOTE — Telephone Encounter (Signed)
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 Virginia Hawkins

## 2016-10-07 ENCOUNTER — Telehealth: Payer: Self-pay | Admitting: *Deleted

## 2016-10-07 DIAGNOSIS — M722 Plantar fascial fibromatosis: Secondary | ICD-10-CM

## 2016-10-07 DIAGNOSIS — R52 Pain, unspecified: Secondary | ICD-10-CM

## 2016-10-07 NOTE — Telephone Encounter (Addendum)
Left message requesting call back with information concerning scheduling MRIs at [facility] or [facility]. Faxed to [facility] scheduling due to cost to pt. Left message informing pt she could contact the main scheduling for [facility] [phone] to schedule.10/17/2016-Left message informing pt of Dr. Leigh Aurora review of the MRIs and orders for PT, I gave [facility] main office [phone]. Faxed orders for PT to [facility].

## 2016-10-07 NOTE — Telephone Encounter (Signed)
Yes I am returning Virginia Hawkins's call in regards to my MRI. If we could do it at [facility] either tomorrow Tuesday 17 July or Friday 20 July. Please call me back at [phone].

## 2016-10-09 NOTE — Telephone Encounter (Signed)
Error.Virginia Hawkins 

## 2016-10-15 ENCOUNTER — Ambulatory Visit (HOSPITAL_COMMUNITY)
Admission: RE | Admit: 2016-10-15 | Discharge: 2016-10-15 | Disposition: A | Discharge status: Home / Self Care | Payer: 59 | Source: Ambulatory Visit | Attending: Podiatry | Admitting: Podiatry

## 2016-10-15 ENCOUNTER — Encounter (HOSPITAL_COMMUNITY): Payer: Self-pay | Admitting: Radiology

## 2016-10-15 DIAGNOSIS — M79673 Pain in unspecified foot: Secondary | ICD-10-CM | POA: Diagnosis not present

## 2016-10-15 DIAGNOSIS — M79671 Pain in right foot: Secondary | ICD-10-CM | POA: Diagnosis not present

## 2016-10-15 DIAGNOSIS — M19072 Primary osteoarthritis, left ankle and foot: Secondary | ICD-10-CM | POA: Diagnosis not present

## 2016-10-17 NOTE — Telephone Encounter (Signed)
-----   Message from Trula Slade, DPM sent at 10/16/2016  3:55 PM EDT ----- MRI is negative. Please have her do physical therapy and also I want her to see Liliane Channel to work on her inserts.

## 2016-10-17 NOTE — Telephone Encounter (Signed)
-----   Message from Trula Slade, DPM sent at 10/16/2016  4:02 PM EDT ----- Please let her know the MRI was essentially normal. I would recommend PT and also have her come in to see Liliane Channel to look at her inserts.

## 2016-10-29 ENCOUNTER — Ambulatory Visit: Payer: Self-pay | Admitting: Podiatry

## 2016-10-29 DIAGNOSIS — Z01419 Encounter for gynecological examination (general) (routine) without abnormal findings: Secondary | ICD-10-CM | POA: Diagnosis not present

## 2016-10-29 MED FILL — NITROFURANTOIN MCR 50 MG CA: 50 | 30 days supply | Qty: 30 | Fill #0

## 2016-11-15 ENCOUNTER — Encounter: Payer: Self-pay | Admitting: Podiatry

## 2016-11-15 ENCOUNTER — Ambulatory Visit (INDEPENDENT_AMBULATORY_CARE_PROVIDER_SITE_OTHER): Payer: 59 | Admitting: Podiatry

## 2016-11-15 DIAGNOSIS — M722 Plantar fascial fibromatosis: Secondary | ICD-10-CM | POA: Diagnosis not present

## 2016-11-15 DIAGNOSIS — M2550 Pain in unspecified joint: Secondary | ICD-10-CM

## 2016-11-18 NOTE — Progress Notes (Signed)
Subjective: 46 year old female presents the office in follow-up evaluation of bilateral foot pain. She says overall her pain injection improved compared to what it was in the first started at the orthotics are helping quite a bit when she first got them that she feels that she has worn them out. She denies any recent injury, she denies any swelling or redness. She states that she gets occasional cramps to her feet more than the right side. Denies any leg cramps. Denies any systemic complaints such as fevers, chills, nausea, vomiting. No acute changes since last appointment, and no other complaints at this time.   Objective: AAO x3, NAD DP/PT pulses palpable bilaterally, CRT less than 3 seconds There is tenderness on the medial been a plantar fascial mostly within the arch of the foot and is somewhat along the insertion the plantar medial tubercle of the calcaneus. There is no area pinpoint bony tenderness there is no pain vibratory sensation. There is no overlying edema, erythema, increase in warmth. No open lesions or pre-ulcerative lesions.  No pain with calf compression, swelling, warmth, erythema  Assessment: Bilateral chronic foot pain  Plan: -All treatment options discussed with the patient including all alternatives, risks, complications.  -Discussed MRI results with the patient again today. -I evaluated orthotics that do not appear to be fitting to the arch of her feet. Did remold her for inserts family discussed this with Liliane Channel next week. -At this point and start with trying to new topical orthotics to help her give her more support. I discussed other treatment options including EPAT.  -Given chronic foot pain and will order an arthritic panel as well. -Patient encouraged to call the office with any questions, concerns, change in symptoms.   Celesta Gentile, DPM

## 2016-12-06 ENCOUNTER — Ambulatory Visit: Payer: 59 | Admitting: Podiatry

## 2016-12-17 MED FILL — METOPROLOL SUCC ER 50 MG TA: 50 | 90 days supply | Qty: 90 | Fill #1

## 2017-01-10 ENCOUNTER — Ambulatory Visit: Payer: 59

## 2017-01-10 DIAGNOSIS — M722 Plantar fascial fibromatosis: Secondary | ICD-10-CM

## 2017-01-10 MED FILL — PREMARIN VAGINAL CREAM-APPL: 0.625 | 30 days supply | Qty: 30 | Fill #0

## 2017-01-10 NOTE — Patient Instructions (Signed)

## 2017-02-24 MED FILL — BENZONATATE 200 MG CAPSULE: 200 | 7 days supply | Qty: 21 | Fill #0

## 2017-03-24 MED FILL — METOPROLOL SUCC ER 50 MG TA: 50 | 90 days supply | Qty: 90 | Fill #2

## 2017-04-25 DIAGNOSIS — N6452 Nipple discharge: Secondary | ICD-10-CM | POA: Diagnosis not present

## 2017-04-25 DIAGNOSIS — R238 Other skin changes: Secondary | ICD-10-CM | POA: Diagnosis not present

## 2017-04-25 DIAGNOSIS — N644 Mastodynia: Secondary | ICD-10-CM | POA: Diagnosis not present

## 2017-04-25 DIAGNOSIS — L0292 Furuncle, unspecified: Secondary | ICD-10-CM | POA: Diagnosis not present

## 2017-06-23 MED FILL — METOPROLOL SUCCINATE ER 50: 50 | 90 days supply | Qty: 90 | Fill #3

## 2017-07-23 MED FILL — FLUTICASONE PROP 50 MCG SPR: 50 | 30 days supply | Qty: 16 | Fill #1

## 2017-08-28 ENCOUNTER — Ambulatory Visit: Payer: 59 | Admitting: Podiatry

## 2017-08-28 ENCOUNTER — Encounter

## 2017-08-28 ENCOUNTER — Encounter: Payer: Self-pay | Admitting: Podiatry

## 2017-08-28 DIAGNOSIS — M722 Plantar fascial fibromatosis: Secondary | ICD-10-CM

## 2017-08-28 MED ORDER — METHYLPREDNISOLONE 4 MG PO TBPK: ORAL_TABLET | ORAL | 0 refills | Status: DC

## 2017-08-29 MED FILL — METHYLPREDNISOLONE 4 MG TAB: 4 | 6 days supply | Qty: 21 | Fill #0

## 2017-09-01 DIAGNOSIS — M722 Plantar fascial fibromatosis: Secondary | ICD-10-CM | POA: Insufficient documentation

## 2017-09-01 NOTE — Progress Notes (Addendum)
Subjective: 47 year old female presents the office today for concerns of bilateral foot pain.  She states that she was doing well for some time up until last couple weeks she started to get some pain back in the same area to the arch of the foot.  She states the orthotics are helping at first but she feels they are not getting a lot of support.  No recent injury or trauma denies any swelling or redness. Denies any systemic complaints such as fevers, chills, nausea, vomiting. No acute changes since last appointment, and no other complaints at this time.   Objective: AAO x3, NAD DP/PT pulses palpable bilaterally, CRT less than 3 seconds Decrease in medial arch upon weightbearing.  There is recurrence of discomfort mostly on the medial band of plantar fascia the arch of the foot.  Plantar fascia appears to be intact.  No area pinpoint bony tenderness.  Achilles tendon appears to be intact. No open lesions or pre-ulcerative lesions.  No pain with calf compression, swelling, warmth, erythema  Assessment: Bilateral plantar fasciitis  Plan: -All treatment options discussed with the patient including all alternatives, risks, complications.  -Medrol dose pack was prescribed.  Continue stretching, icing daily.  I will send the orthotics back to see if we can increase the arch to get more support.  I did go ahead and hold her again if needed however I want to see if we can modify the orthotics. -Patient encouraged to call the office with any questions, concerns, change in symptoms.   Trula Slade DPM

## 2017-09-09 ENCOUNTER — Telehealth: Payer: Self-pay | Admitting: Podiatry

## 2017-09-09 ENCOUNTER — Encounter: Payer: Self-pay | Admitting: Podiatry

## 2017-09-09 NOTE — Telephone Encounter (Signed)
Pt is in a lot of pain and her feet are still swelling, she is on her feet for 12 hour. She was wondering could she get a note to be able to stay off her feet for so long. She wants to know what else she can do to keep the inflammation down.Please give patient a call.

## 2017-09-11 ENCOUNTER — Encounter: Payer: Self-pay | Admitting: *Deleted

## 2017-09-11 MED ORDER — MELOXICAM 15 MG PO TABS: 15.0000 mg | ORAL_TABLET | Freq: Every day | ORAL | 0 refills | Status: DC

## 2017-09-11 MED FILL — MELOXICAM 15 MG TABLET: 15 | 30 days supply | Qty: 30 | Fill #0

## 2017-09-11 NOTE — Telephone Encounter (Signed)
Is she taking an NSAID? If not please have her do mobic 15mg  or if she is on the voltaren then lets switch to mobic. Also ice. Can do a note for light duty at work if she would like. Also can consider EPAT.

## 2017-09-11 NOTE — Addendum Note (Signed)
Addended by: Harriett Sine D on: 09/11/2017 10:14 AM   Modules accepted: Orders

## 2017-09-11 NOTE — Telephone Encounter (Addendum)
I spoke with pt she states she is not using antiinflammatory. I told her Dr. Alcide Clever had ordered Meloxicam to be taken one tablet daily, and I asked pt what her employer considered light duty. Pt states they have a desk she can work from, but to just put light duty on the note and they will find something for her to do. I suggested that I put light duty, not to include standing more than 4 hours per shift, to extend to 10/02/2017 appt when she will be reevaluated. Pt states that would be good. Pt states she will pick up the note in the [place] office. I also hand wrote a note with ice instructions  3-4 times daily for 15-20 minutes/session protecting skin from the ice with a light cloth and attached with light duty note, with a doubled stockingette covered [employer] gel ice pack.

## 2017-09-18 DIAGNOSIS — G4701 Insomnia due to medical condition: Secondary | ICD-10-CM | POA: Diagnosis not present

## 2017-09-18 DIAGNOSIS — Z13220 Encounter for screening for lipoid disorders: Secondary | ICD-10-CM | POA: Diagnosis not present

## 2017-09-18 DIAGNOSIS — R630 Anorexia: Secondary | ICD-10-CM | POA: Diagnosis not present

## 2017-09-18 DIAGNOSIS — N926 Irregular menstruation, unspecified: Secondary | ICD-10-CM | POA: Diagnosis not present

## 2017-09-18 DIAGNOSIS — I10 Essential (primary) hypertension: Secondary | ICD-10-CM | POA: Diagnosis not present

## 2017-09-18 DIAGNOSIS — M797 Fibromyalgia: Secondary | ICD-10-CM | POA: Diagnosis not present

## 2017-09-18 DIAGNOSIS — Z0000 Encounter for general adult medical examination without abnormal findings: Secondary | ICD-10-CM | POA: Diagnosis not present

## 2017-09-18 MED FILL — NORTRIPTYLINE HCL 50 MG CAP: 50 | 30 days supply | Qty: 30 | Fill #0

## 2017-09-18 MED FILL — METOPROLOL SUCCINATE ER 50: 50 | 90 days supply | Qty: 90 | Fill #0

## 2017-09-18 MED FILL — CYCLOBENZAPRINE 10 MG TAB: 10 | 30 days supply | Qty: 60 | Fill #0

## 2017-09-18 MED FILL — CYPROHEPTADINE 4 MG TABLET: 4 | 30 days supply | Qty: 60 | Fill #0

## 2017-09-22 MED FILL — NITROFURANTOIN MCR 50 MG CA: 50 | 30 days supply | Qty: 30 | Fill #1

## 2017-10-02 ENCOUNTER — Encounter: Payer: Self-pay | Admitting: Podiatry

## 2017-10-02 ENCOUNTER — Ambulatory Visit: Payer: 59 | Admitting: Podiatry

## 2017-10-02 DIAGNOSIS — M722 Plantar fascial fibromatosis: Secondary | ICD-10-CM | POA: Diagnosis not present

## 2017-10-02 MED ORDER — IBUPROFEN 800 MG PO TABS: 800.0000 mg | ORAL_TABLET | Freq: Three times a day (TID) | ORAL | 0 refills | Status: DC | PRN

## 2017-10-02 MED FILL — IBUPROFEN 800 MG TAB: 800 | 10 days supply | Qty: 30 | Fill #0

## 2017-10-05 NOTE — Progress Notes (Signed)
Subjective: 47 year old female presents the office today for concerns of bilateral foot pain.  She states that she is doing about the same.  She presents to pick up her new orthotics.  She has no new concerns.  She denies any recent injury or swelling. Denies any systemic complaints such as fevers, chills, nausea, vomiting. No acute changes since last appointment, and no other complaints at this time.   Objective: AAO x3, NAD DP/PT pulses palpable bilaterally, CRT less than 3 seconds Decrease in medial arch upon weightbearing.  The majority tenderness is on the medial band of plantar fascia the arch of the foot.  There is no area pinpoint bony tenderness or pain to vibratory sensation.  No edema, erythema, increase in warmth.  Achilles tendon, plantar fascia appear to be intact. No open lesions or pre-ulcerative lesions.  No pain with calf compression, swelling, warmth, erythema  Assessment: Bilateral plantar fasciitis  Plan: -All treatment options discussed with the patient including all alternatives, risks, complications.  -Orthotics were dispensed today.  We did increase the arch to hopefully help more with the arch pain.  Continue stretching, icing exercises daily.  Discussed physical therapy.  Trula Slade DPM

## 2017-10-07 ENCOUNTER — Other Ambulatory Visit: Payer: Self-pay | Admitting: *Deleted

## 2017-10-07 DIAGNOSIS — M722 Plantar fascial fibromatosis: Secondary | ICD-10-CM

## 2017-10-28 MED FILL — NITROFURANTOIN MCR 50 MG CA: 50 | 30 days supply | Qty: 30 | Fill #2

## 2017-11-04 DIAGNOSIS — Z1231 Encounter for screening mammogram for malignant neoplasm of breast: Secondary | ICD-10-CM | POA: Diagnosis not present

## 2017-11-04 DIAGNOSIS — Z1212 Encounter for screening for malignant neoplasm of rectum: Secondary | ICD-10-CM | POA: Diagnosis not present

## 2017-11-04 DIAGNOSIS — Z01419 Encounter for gynecological examination (general) (routine) without abnormal findings: Secondary | ICD-10-CM | POA: Diagnosis not present

## 2017-11-04 DIAGNOSIS — N6452 Nipple discharge: Secondary | ICD-10-CM | POA: Diagnosis not present

## 2017-11-13 ENCOUNTER — Ambulatory Visit: Payer: 59 | Admitting: Podiatry

## 2017-11-27 ENCOUNTER — Ambulatory Visit: Payer: 59 | Admitting: Podiatry

## 2017-12-01 ENCOUNTER — Ambulatory Visit: Payer: 59 | Admitting: Podiatry

## 2017-12-15 MED FILL — METOPROLOL SUCCINATE ER 50: 50 | 90 days supply | Qty: 90 | Fill #1

## 2017-12-25 ENCOUNTER — Encounter: Payer: Self-pay | Admitting: Podiatry

## 2017-12-25 ENCOUNTER — Ambulatory Visit: Payer: 59 | Admitting: Podiatry

## 2017-12-25 DIAGNOSIS — M722 Plantar fascial fibromatosis: Secondary | ICD-10-CM

## 2017-12-25 DIAGNOSIS — M79673 Pain in unspecified foot: Secondary | ICD-10-CM | POA: Diagnosis not present

## 2017-12-25 NOTE — Patient Instructions (Signed)
You can look at Oofos and Vionic sandals

## 2017-12-28 NOTE — Progress Notes (Signed)
Subjective: 47 year old female presents the office today for concerns of bilateral foot pain.  She states that she still gets pain mostly in the arch of the foot.  She states that she also started to walk differently which is starting to affect her knees.  She denies any recent injury denies any swelling.  No numbness or tingling.  She has no other concerns today.  Objective: AAO x3, NAD DP/PT pulses palpable bilaterally, CRT less than 3 seconds Decrease in medial arch upon weightbearing.  The majority tenderness is on the medial band of plantar fascia the arch of the foot.  Overall the exam is unchanged.  There is no area pinpoint bony tenderness or pain to vibratory sensation.  There is no edema, erythema, increase in warmth.  Negative Tinel sign bilaterally.  No pain on the plantar heel majority of pain is along the arch of the foot. No open lesions or pre-ulcerative lesions.  No pain with calf compression, swelling, warmth, erythema  Assessment: Bilateral plantar fasciitis  Plan: -All treatment options discussed with the patient including all alternatives, risks, complications.  -We attempted numerous conservative treatment of any significant improvement.  Reviewed the previous MRI with her as well.  Discussed any new MRI of the foot however at this point all things can change.  We discussed other treatment options at this point we discussed EPAT and she wished to proceed with this.  Start this at her convenience.  Discussed pricing at $50 a treatment for her.  Discussed we will start the most symptomatic side first.  Trula Slade DPM

## 2017-12-30 ENCOUNTER — Ambulatory Visit (INDEPENDENT_AMBULATORY_CARE_PROVIDER_SITE_OTHER): Payer: 59

## 2017-12-30 ENCOUNTER — Encounter: Payer: Self-pay | Admitting: Podiatry

## 2017-12-30 DIAGNOSIS — M722 Plantar fascial fibromatosis: Secondary | ICD-10-CM | POA: Diagnosis not present

## 2017-12-30 DIAGNOSIS — B351 Tinea unguium: Secondary | ICD-10-CM

## 2018-01-02 MED ORDER — IBUPROFEN 800 MG PO TABS: 800.0000 mg | ORAL_TABLET | Freq: Three times a day (TID) | ORAL | 0 refills | Status: DC | PRN

## 2018-01-02 NOTE — Progress Notes (Signed)
Patient is here today with complaint of bilateral plantar fasciitis, this is been ongoing for several months, she has tried various therapies and nothing seems to be helping.  Pain on palpation to right mid to medial plantar fascial band.  ESWT administered to right foot 4j , although both feet are having complications.  Ibuprofen 800 mg called to [facility] per Dr. Paulla Dolly.  Boot was dispensed at this visit, with instructions on use.  She is to avoid NSAIDs and ice, and utilize her boot post therapy.  Per Dr. Jacqualyn Posey pricing for epat $50 to treatment.  She is to follow-up next week for second treatment.

## 2018-01-08 ENCOUNTER — Other Ambulatory Visit: Payer: 59

## 2018-01-16 ENCOUNTER — Other Ambulatory Visit: Payer: 59

## 2018-01-23 ENCOUNTER — Ambulatory Visit: Payer: Self-pay

## 2018-01-23 DIAGNOSIS — M722 Plantar fascial fibromatosis: Secondary | ICD-10-CM

## 2018-01-23 DIAGNOSIS — M79676 Pain in unspecified toe(s): Secondary | ICD-10-CM

## 2018-01-23 MED ORDER — IBUPROFEN 800 MG PO TABS: 800.0000 mg | ORAL_TABLET | Freq: Three times a day (TID) | ORAL | 0 refills | Status: DC | PRN

## 2018-01-23 MED FILL — IBUPROFEN 800 MG TAB: 800 | 10 days supply | Qty: 30 | Fill #0

## 2018-01-28 NOTE — Progress Notes (Signed)
Patient is here today with complaint of bilateral plantar fasciitis, this is been ongoing for several months, she has tried various therapies and nothing seems to be helping. She has not had any relief in pain so far  Pain on palpation to right mid to medial plantar fascial band.  ESWT administered to right foot 2.2j , although both feet are having complications.  Ibuprofen 800 mg called to [facility] per Dr. Paulla Dolly.  Boot was dispensed at this visit, with instructions on use.  She is to avoid NSAIDs and ice, and utilize her boot post therapy.  Per Dr. Jacqualyn Posey pricing for epat $50 to treatment.  She is to follow-up next week for second treatment.

## 2018-02-02 ENCOUNTER — Other Ambulatory Visit: Payer: 59

## 2018-02-12 DIAGNOSIS — L089 Local infection of the skin and subcutaneous tissue, unspecified: Secondary | ICD-10-CM | POA: Diagnosis not present

## 2018-02-12 DIAGNOSIS — M542 Cervicalgia: Secondary | ICD-10-CM | POA: Diagnosis not present

## 2018-02-12 MED FILL — MUPIROCIN 2% OINTMENT: 2 | 7 days supply | Qty: 22 | Fill #0

## 2018-02-12 MED FILL — CYCLOBENZAPRINE 10 MG TAB: 10 | 30 days supply | Qty: 90 | Fill #0

## 2018-03-05 ENCOUNTER — Ambulatory Visit: Payer: Self-pay

## 2018-03-05 DIAGNOSIS — M79676 Pain in unspecified toe(s): Secondary | ICD-10-CM

## 2018-03-05 DIAGNOSIS — M722 Plantar fascial fibromatosis: Secondary | ICD-10-CM

## 2018-03-05 NOTE — Progress Notes (Signed)
Patient is here today with complaint of bilateral plantar fasciitis, this is been ongoing for several months, she has tried various therapies and nothing seems to be helping. She has not had any relief in pain so far  Pain on palpation to right mid to medial plantar fascial band.  ESWT administered to right foot 3j , although both feet are having complications.  Ibuprofen 800 mg called to [facility] per Dr. Paulla Dolly.  Boot was dispensed at this visit, with instructions on use.  She is to avoid NSAIDs and ice, and utilize her boot post therapy.  Per Dr. Jacqualyn Posey pricing for epat $50 to treatment.  She is to follow-up next week for second treatment. Note to Marcie Bal to extend intermittent FMLA to Apr 25, 2018

## 2018-03-20 MED FILL — METOPROLOL SUCCINATE ER 50: 50 | 90 days supply | Qty: 90 | Fill #2

## 2018-03-25 HISTORY — PX: FOOT SURGERY: SHX648

## 2018-03-30 ENCOUNTER — Other Ambulatory Visit: Payer: 59

## 2018-04-03 MED FILL — IBUPROFEN 800 MG TAB: 800 | 10 days supply | Qty: 30 | Fill #0

## 2018-04-06 ENCOUNTER — Ambulatory Visit: Payer: Self-pay

## 2018-04-06 DIAGNOSIS — M722 Plantar fascial fibromatosis: Secondary | ICD-10-CM

## 2018-04-06 DIAGNOSIS — B351 Tinea unguium: Secondary | ICD-10-CM

## 2018-04-13 NOTE — Progress Notes (Signed)
Patient is here today with complaint of bilateral plantar fasciitis, this is been ongoing for several months, she has tried various therapies and nothing seems to be helping.  She says she has noticed some improvement in the pain so far.  Pain on palpation to right mid to medial plantar fascial band.  ESWT administered to right foot 4j , although both feet are having complications.   She is to avoid NSAIDs and ice, and utilize her boot post therapy.  Per Dr. Jacqualyn Posey pricing for epat $50 to treatment.  She is to follow-up in 1 to 2 weeks to begin treatment on her left foot we extended her intermittent FMLA to June 24, 2018

## 2018-04-15 ENCOUNTER — Ambulatory Visit: Payer: 59

## 2018-04-15 DIAGNOSIS — M722 Plantar fascial fibromatosis: Secondary | ICD-10-CM

## 2018-04-20 NOTE — Progress Notes (Signed)
Patient is here today for treatment of her right foot, recently diagnosed with bilateral plantar fasciitis.  Her left foot has been treated previously for times shockwave therapy, and she states that her pain is still present and has not improved very much.  Pain on palpation to right mid medial plantar fascial band.  ESWT administered at 4 J and tolerated well.  She is to follow-up in 2 weeks for her second treatment.

## 2018-04-29 ENCOUNTER — Ambulatory Visit: Payer: Commercial Managed Care - PPO

## 2018-04-29 DIAGNOSIS — M722 Plantar fascial fibromatosis: Secondary | ICD-10-CM

## 2018-04-29 NOTE — Progress Notes (Signed)
Patient is here today for treatment of her right foot, recently diagnosed with bilateral plantar fasciitis.  Her left foot has been treated previously for times shockwave therapy, and she states that her pain is still present and has not improved very much.  Pain on palpation to right mid medial plantar fascial band.  ESWT administered at 6 J and tolerated well.  She is to follow-up in 2 weeks for her 3rd treatment.

## 2018-05-05 MED FILL — FLUOCINONIDE 0.05% CREAM: 0.05 | 30 days supply | Qty: 120 | Fill #0

## 2018-05-08 MED FILL — MUPIROCIN 2% OINTMENT: 2 | 7 days supply | Qty: 22 | Fill #0

## 2018-05-12 DIAGNOSIS — Z1211 Encounter for screening for malignant neoplasm of colon: Secondary | ICD-10-CM | POA: Diagnosis not present

## 2018-05-12 DIAGNOSIS — R0989 Other specified symptoms and signs involving the circulatory and respiratory systems: Secondary | ICD-10-CM | POA: Diagnosis not present

## 2018-05-12 DIAGNOSIS — K219 Gastro-esophageal reflux disease without esophagitis: Secondary | ICD-10-CM | POA: Diagnosis not present

## 2018-05-12 DIAGNOSIS — R1084 Generalized abdominal pain: Secondary | ICD-10-CM | POA: Diagnosis not present

## 2018-05-12 DIAGNOSIS — K5900 Constipation, unspecified: Secondary | ICD-10-CM | POA: Diagnosis not present

## 2018-05-13 MED FILL — FAMOTIDINE 20 MG TABLET: 20 | 30 days supply | Qty: 60 | Fill #0

## 2018-05-13 MED FILL — OMEPRAZOLE 40 MG CPDR: 40 | 30 days supply | Qty: 30 | Fill #0

## 2018-05-15 ENCOUNTER — Other Ambulatory Visit: Payer: Self-pay

## 2018-05-20 DIAGNOSIS — K209 Esophagitis, unspecified: Secondary | ICD-10-CM | POA: Diagnosis not present

## 2018-05-20 DIAGNOSIS — K219 Gastro-esophageal reflux disease without esophagitis: Secondary | ICD-10-CM | POA: Diagnosis not present

## 2018-05-20 DIAGNOSIS — K449 Diaphragmatic hernia without obstruction or gangrene: Secondary | ICD-10-CM | POA: Diagnosis not present

## 2018-05-29 ENCOUNTER — Ambulatory Visit: Payer: Self-pay

## 2018-05-29 DIAGNOSIS — M722 Plantar fascial fibromatosis: Secondary | ICD-10-CM

## 2018-06-02 NOTE — Progress Notes (Signed)
Patient is here today for treatment of her right foot, recently diagnosed with bilateral plantar fasciitis.  Her left foot has been treated previously for times shockwave therapy, and she states that her pain is still present and has not improved very much.  Pain on palpation to right mid medial plantar fascial band.  ESWT administered at 5 J and tolerated well.  She is to follow-up in 2 weeks for her 4th treatment

## 2018-06-05 ENCOUNTER — Ambulatory Visit: Payer: 59 | Admitting: Podiatry

## 2018-06-05 ENCOUNTER — Other Ambulatory Visit: Payer: Self-pay

## 2018-06-05 DIAGNOSIS — M722 Plantar fascial fibromatosis: Secondary | ICD-10-CM

## 2018-06-05 MED ORDER — IBUPROFEN 800 MG PO TABS: 800.0000 mg | ORAL_TABLET | Freq: Three times a day (TID) | ORAL | 0 refills | Status: DC | PRN

## 2018-06-05 MED ORDER — TRIAMCINOLONE ACETONIDE 10 MG/ML IJ SUSP
10.0000 mg | Freq: Once | INTRAMUSCULAR | Status: AC
  Administered 2018-06-05: 10 mg

## 2018-06-05 MED FILL — IBUPROFEN 800 MG TABS: 800 | 10 days supply | Qty: 30 | Fill #0

## 2018-06-05 NOTE — Patient Instructions (Signed)
Ice Massage and Cold Packs Home Program  Ice and cold packs are used so that we can return the muscle to it's natural resting state without causing more pain, which can lead to more spasm, etc.  Icing and cold packs are also used to reduce swelling, which can lead to pain and stiffness.  COLD PACKS Cold packs should be placed circumferentially around the swollen area (ie., the wrist, etc.).  A paper towel can be placed over the area before the cold pack is applied.  A towel may be wrapped around the outside of the cold pack to keep the cold in.  The swollen area should be elevated with the cold pack, if possible.  ICE MASSAGE Fill a 4 ounce paper cup three-quarters full and put it in a freezer until it is frozen.  When ready to use, tear off about 1 inch of the cup so that some of the ice is showing while the bottom of the cup can be used to hold onto. Massage the entire muscle area as instructed by your therapist.  You may use circular or up and down strokes, but do not hold the ice in one spot.  Four phases to the ice massage and cold pack application: 1. Cold: which you feel when you first apply the ice. 2. Ache: after a few minutes 3. Burning: after approximately five minutes, it will feel like your skin is burning.  At this point, remove the ice for a minute or so. 4. Numbness: THIS IS THE CRUCIAL PHASE!!! Return the ice or cold pack and massage until all the burning disappears.  This signals the end of cryotherapy.  The entire procedure should take ten to fifteen minutes while using the cold pack.  Do Not perform the ice massage for more than seven minutes on a small area or more than ten minutes on a large area.  Cryotherapy should be performed after exercises, when edema occurs, or when an area is painful. 

## 2018-06-10 ENCOUNTER — Telehealth: Payer: Self-pay | Admitting: Podiatry

## 2018-06-10 NOTE — Telephone Encounter (Signed)
Left message informing pt that it was not unusual to have bruising at an injection site and on occasion a lump, apply ice and rest the area, call with concerns.

## 2018-06-10 NOTE — Telephone Encounter (Signed)
patient came in and stated that she has bruising post injection that she received on Friday 06/05/2018 in both feet. Wanted to know if that was normal. Please call with and inform if visit needed to follow up.

## 2018-06-14 NOTE — Progress Notes (Signed)
Subjective: 48 year old female presents the office today for follow-up evaluation of bilateral foot pain.  States that he has moved.  She states is more of the heel and the arch of her foot today.  She is asking for an extension of her intermittent FMLA.  She states that she only uses a couple times but do not think is aggravated her symptoms at work.  She states that the inserts were helping but she wears them out very quickly.  Also she states that the shockwave therapy was helpful. Denies any systemic complaints such as fevers, chills, nausea, vomiting. No acute changes since last appointment, and no other complaints at this time.   Objective: AAO x3, NAD DP/PT pulses palpable bilaterally, CRT less than 3 seconds Today there is tenderness palpation on plantar medial tubercle of the calcaneus at the insertion of plantar fascia bilaterally and mildly to the arch of the foot.  There is no pain with lateral compression of the calcaneus or along the Achilles tendon.  Flatfoot is present.  Negative Tinel sign.  No other areas of tenderness identified.  Flexor, extensor tendons appear to be intact. No open lesions or pre-ulcerative lesions.  No pain with calf compression, swelling, warmth, erythema  Assessment: Bilateral chronic foot pain no plantar fasciitis  Plan: -All treatment options discussed with the patient including all alternatives, risks, complications.  -Today steroid injections performed bilaterally.  See procedure note below.  She wears out her orthotics quickly. Discussed with rick and she is going to get new orthotics. We are going to change the material to see if this will help more and last longer.  -Encouraged to continue with stretching, icing exercises daily.  Discussed changing shoes. -Wants to hold off on any surgical intervention. -Patient encouraged to call the office with any questions, concerns, change in symptoms.   Trula Slade DPM

## 2018-06-14 NOTE — Addendum Note (Signed)
Addended by: Celesta Gentile R on: 06/14/2018 02:53 PM   Modules accepted: Level of Service

## 2018-06-17 MED FILL — METOPROLOL SUCCINATE ER 50: 50 | 90 days supply | Qty: 90 | Fill #0

## 2018-06-29 ENCOUNTER — Ambulatory Visit: Payer: 59 | Admitting: Podiatry

## 2018-06-29 ENCOUNTER — Other Ambulatory Visit: Payer: Self-pay

## 2018-06-29 ENCOUNTER — Other Ambulatory Visit: Payer: 59 | Admitting: Orthotics

## 2018-06-29 DIAGNOSIS — M722 Plantar fascial fibromatosis: Secondary | ICD-10-CM | POA: Diagnosis not present

## 2018-06-30 MED FILL — CYPROHEPTADINE HCL 4 MG TAB: 4 | 30 days supply | Qty: 60 | Fill #0

## 2018-06-30 MED FILL — NITROFURANTOIN MACROCRYSTAL: 50 | 90 days supply | Qty: 90 | Fill #0

## 2018-07-02 NOTE — Progress Notes (Signed)
Subjective: 48 year old female presents the office today for follow-up evaluation of bilateral foot pain.  She states that overall her right foot is been much better since the injection she has noticed pain the left foot and she points on the arch of the foot. Otherwise she said no significant changes she denies any swelling or redness.  No other areas of tenderness.  No pain to the ankle or dorsal foot.  Denies any numbness or tingling.  No other joint pain.  Pain is intermittent. Denies any systemic complaints such as fevers, chills, nausea, vomiting. No acute changes since last appointment, and no other complaints at this time.   Objective: AAO x3, NAD DP/PT pulses palpable bilaterally, CRT less than 3 seconds Today there minimal tenderness palpation on plantar medial tubercle of the calcaneus at the insertion of plantar fascia on the right side and much improved.  There is no pain on the left plantar heel.  The majority of tenderness is along the medial band plantar fashion the arch of the foot on the left side.  Plantar fascial peers to be intact.  There is no pain with lateral compression of the calcaneus there is no area pinpoint bony tenderness or pain to vibratory sensation.  Flatfoot deformity is present.   No open lesions or pre-ulcerative lesions.  No pain with calf compression, swelling, warmth, erythema  Assessment: Bilateral chronic foot pain no plantar fasciitis  Plan: -All treatment options discussed with the patient including all alternatives, risks, complications.  -Injection of the right foot quite a bit.  Swelling pain of the left foot.  Plantar fascial taping was applied.  Unfortunately her orthotics have not yet come back and.  She said that they have been very helpful previously.  We discussed other treatment options today including surgical intervention, PRP injection, amniotic injection. -Patient encouraged to call the office with any questions, concerns, change in symptoms.    Trula Slade DPM

## 2018-07-06 ENCOUNTER — Telehealth: Payer: Self-pay | Admitting: Podiatry

## 2018-07-06 DIAGNOSIS — M79676 Pain in unspecified toe(s): Secondary | ICD-10-CM

## 2018-07-06 NOTE — Telephone Encounter (Signed)
Pt was seen on 06/29/18 and had her foot wrapped to help with her plantar fasciitis pain and would like to come by the office and see if she can buy the supplies and wrap her foot herself going forward. Please give patient a call.

## 2018-07-17 ENCOUNTER — Telehealth: Payer: Self-pay | Admitting: Podiatry

## 2018-07-17 NOTE — Telephone Encounter (Signed)
Pt called to check to see if new orthotics came in yet.   I told pt I would check with Liliane Channel and call her back Monday when he is in the office.

## 2018-07-20 NOTE — Telephone Encounter (Signed)
Left message with her that they are ordered and should be here by end of week, first of next week.

## 2018-08-10 ENCOUNTER — Ambulatory Visit: Payer: 59 | Admitting: Podiatry

## 2018-08-10 ENCOUNTER — Other Ambulatory Visit: Payer: Self-pay

## 2018-08-10 ENCOUNTER — Encounter: Payer: Self-pay | Admitting: Podiatry

## 2018-08-10 VITALS — Temp 97.9°F

## 2018-08-10 DIAGNOSIS — M79673 Pain in unspecified foot: Secondary | ICD-10-CM

## 2018-08-10 DIAGNOSIS — M722 Plantar fascial fibromatosis: Secondary | ICD-10-CM | POA: Diagnosis not present

## 2018-08-10 NOTE — Patient Instructions (Signed)

## 2018-08-13 NOTE — Progress Notes (Signed)
Subjective: 48 year old female presents the office today for follow-up evaluation of bilateral foot pain pickup adjusted orthotics.  She states the plantar fascial taping was very helpful.  She denies any swelling.  Her pain is intermittent and she still points to the arch of the foot as well as the heel with the right worse than the right. Denies any systemic complaints such as fevers, chills, nausea, vomiting. No acute changes since last appointment, and no other complaints at this time.   Objective: AAO x3, NAD DP/PT pulses palpable bilaterally, CRT less than 3 seconds There is improved but yet mild tenderness palpation still medial band plantar fashion the arch of the foot of the left side as well as left plantar aspect of the heel.  Significant lateral compression of calcaneus.  There is no edema or erythema.  Crepitus points present. No open lesions or pre-ulcerative lesions.  No pain with calf compression, swelling, warmth, erythema  Assessment: Bilateral arch pain, plantar fasciitis  Plan: -All treatment options discussed with the patient including all alternatives, risks, complications.  -Plan partial taping was reapplied today the left foot as this was very helpful last appointment.  The adjusted orthotics were also dispensed.  Continue with stretching, icing daily.  Continue with night splint.  We also discussed surgical intervention because she has had pain for so long without any significant resolution.  I previously been hesitant to do this because her pain had been changing when I would see her but it seems to be localized along the medial band plantar fascia.  We discussed endoscopic plantar fascial release.  We discussed x-rays of this.  She will consider her options. -Patient encouraged to call the office with any questions, concerns, change in symptoms.   Trula Slade DPM

## 2018-08-26 ENCOUNTER — Emergency Department (HOSPITAL_COMMUNITY): Payer: PRIVATE HEALTH INSURANCE

## 2018-08-26 ENCOUNTER — Other Ambulatory Visit: Payer: Self-pay

## 2018-08-26 ENCOUNTER — Encounter (HOSPITAL_COMMUNITY): Payer: Self-pay | Admitting: Emergency Medicine

## 2018-08-26 ENCOUNTER — Emergency Department (HOSPITAL_COMMUNITY)
Admission: EM | Admit: 2018-08-26 | Discharge: 2018-08-26 | Disposition: A | Discharge status: Home / Self Care | Payer: PRIVATE HEALTH INSURANCE | Attending: Emergency Medicine | Admitting: Emergency Medicine

## 2018-08-26 DIAGNOSIS — W1830XA Fall on same level, unspecified, initial encounter: Secondary | ICD-10-CM | POA: Diagnosis not present

## 2018-08-26 DIAGNOSIS — Y9389 Activity, other specified: Secondary | ICD-10-CM | POA: Insufficient documentation

## 2018-08-26 DIAGNOSIS — M25561 Pain in right knee: Secondary | ICD-10-CM | POA: Insufficient documentation

## 2018-08-26 DIAGNOSIS — Y990 Civilian activity done for income or pay: Secondary | ICD-10-CM | POA: Insufficient documentation

## 2018-08-26 DIAGNOSIS — S8991XA Unspecified injury of right lower leg, initial encounter: Secondary | ICD-10-CM | POA: Diagnosis present

## 2018-08-26 DIAGNOSIS — Y92230 Patient room in hospital as the place of occurrence of the external cause: Secondary | ICD-10-CM | POA: Diagnosis not present

## 2018-08-26 NOTE — ED Triage Notes (Signed)
Pt c/o right knee pain after a fall while working. Ambulatory without difficulty.

## 2018-08-26 NOTE — ED Provider Notes (Signed)
[facility] Provider Note   CSN: [id number] Arrival date & time: 08/26/18  1809    History   Chief Complaint Chief Complaint  Patient presents with  . Fall    HPI Virginia Hawkins is a 48 y.o. female.     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 Twisted right knee and fell. She landed on her right knee. Complaining of knee pain. Mild prepatellar swelling noted. Joint stable.  The history is provided by the patient. No language interpreter was used.  Fall  This is a new problem. The current episode started less than 1 hour ago.    Past Medical History:  Diagnosis Date  . Fibromyalgia   . Ovarian cyst   . Vaginal Pap smear, abnormal     Patient Active Problem List   Diagnosis Date Noted  . Plantar fasciitis 09/01/2017  . History of ovarian cyst 03/23/2015  . History of abnormal cervical Pap smear 03/23/2015  . Fibromyalgia 03/23/2015  . Hypertension, essential 10/05/2014  . Vitiligo 10/05/2014    Past Surgical History:  Procedure Laterality Date  . CYST EXCISION    . SALPINGECTOMY       OB History    Gravida  3   Para  2   Term  2   Preterm      AB  1   Living  2     SAB      TAB      Ectopic  1   Multiple      Live Births  2            Home Medications    Prior to Admission medications   Medication Sig Start Date End Date Taking? Authorizing Provider  cyproheptadine (PERIACTIN) 4 MG tablet Take 1 tablet (4 mg total) by mouth at bedtime. 03/23/15   Caren Macadam, MD  diclofenac (VOLTAREN) 75 MG EC tablet Take 1 tablet (75 mg total) by mouth 2 (two) times daily. 07/02/16   Trula Slade, DPM  DULoxetine (CYMBALTA) 20 MG capsule Take 1 capsule (20 mg total) by mouth daily. 03/23/15   Caren Macadam, MD  ibuprofen (ADVIL,MOTRIN) 800 MG tablet Take 1 tablet (800 mg total) by mouth every 8 (eight) hours as needed. 06/05/18   Trula Slade, DPM  levonorgestrel-ethinyl estradiol (AVIANE,ALESSE,LESSINA) 0.1-20 MG-MCG tablet Take 1 tablet by mouth daily.    [provider]  meloxicam (MOBIC) 15 MG tablet Take 1 tablet (15 mg total) by mouth daily. 09/11/17   Trula Slade, DPM  methocarbamol (ROBAXIN) 500 MG tablet Take 1 tablet (500 mg total) by mouth 2 (two) times daily as needed for muscle spasms. 06/21/16   [name], PA-C  methylPREDNISolone (MEDROL DOSEPAK) 4 MG TBPK tablet Take as directed 08/28/17   Trula Slade, DPM  metoprolol succinate (TOPROL-XL) 50 MG 24 hr tablet TAKE 1 TABLET BY MOUTH ONCE DAILY. TAKE WITH OR IMMEDIATELY FOLLOWING A MEAL 11/01/15   Caren Macadam, MD  naproxen (NAPROSYN) 250 MG tablet Take 1 tablet (250 mg total) by mouth 2 (two) times daily with a meal. 06/21/16   [name], PA-C  urea (CARMOL) 40 % CREA Apply 1 application topically daily. 09/20/16   Trula Slade, DPM    Family History Family History  Problem Relation Age of Onset  . Breast cancer Maternal Aunt   . ALS Maternal Aunt   . Diabetes Other   .  Hypertension Other   . Heart attack Father 36  . Colon cancer Neg Hx   . Ovarian cancer Neg Hx   . Uterine cancer Neg Hx   . Cervical cancer Neg Hx     Social History Social History   Tobacco Use  . Smoking status: Never Smoker  . Smokeless tobacco: Never Used  Substance Use Topics  . Alcohol use: No  . Drug use: No     Allergies   Erythromycin and Sulfur   Review of Systems Review of Systems  Musculoskeletal: Positive for arthralgias.  All other systems reviewed and are negative.    Physical Exam Updated Vital Signs BP (!) 145/63 (BP Location: Right Arm)   Pulse 81   Temp 99.5 F (37.5 C) (Oral)   Resp 16   SpO2 99%   Physical Exam Vitals signs and nursing note reviewed.  HENT:     Head: Atraumatic.  Eyes:     Conjunctiva/sclera: Conjunctivae normal.  Neck:     Musculoskeletal: Normal range of motion.   Cardiovascular:     Rate and Rhythm: Normal rate and regular rhythm.  Pulmonary:     Effort: Pulmonary effort is normal.     Breath sounds: Normal breath sounds.  Abdominal:     Palpations: Abdomen is soft.  Musculoskeletal:        General: Swelling and tenderness present. No deformity.     Right knee: She exhibits swelling. She exhibits no deformity. Tenderness found.  Skin:    General: Skin is warm and dry.  Neurological:     Mental Status: She is alert and oriented to person, place, and time.  Psychiatric:        Mood and Affect: Mood normal.      ED Treatments / Results  Labs (all labs ordered are listed, but only abnormal results are displayed) Labs Reviewed - No data to display  EKG None  Radiology Dg Knee Complete 4 Views Right  Result Date: 08/26/2018 CLINICAL DATA:  Pain status post fall EXAM: RIGHT KNEE - COMPLETE 4+ VIEW COMPARISON:  None. FINDINGS: No evidence of fracture, dislocation, or joint effusion. No evidence of arthropathy or other focal bone abnormality. Soft tissues are unremarkable. IMPRESSION: Negative. Electronically Signed   By: Constance Holster M.D.   On: 08/26/2018 20:20    Procedures Procedures (including critical care time)  Medications Ordered in ED Medications - No data to display   Initial Impression / Assessment and Plan / ED Course  I have reviewed the triage vital signs and the nursing notes.  Pertinent labs & imaging results that were available during my care of the patient were reviewed by me and considered in my medical decision making (see chart for details).        Patient X-Ray negative for obvious fracture or dislocation.  Pt advised to follow up with orthopedics. Patient given knee immobilizer while in ED, conservative therapy recommended and discussed. Patient will be discharged home & is agreeable with above plan. Returns precautions discussed. Pt appears safe for discharge.  Final Clinical Impressions(s) / ED  Diagnoses   Final diagnoses:  Acute pain of right knee    ED Discharge Orders    None       Etta Quill, NP 08/26/18 2254    Sherwood Gambler, MD 08/31/18 [phone]

## 2018-08-26 NOTE — Progress Notes (Signed)
Orthopedic Tech Progress Note Patient Details:  Virginia Hawkins 1970-08-25 [id number]  Ortho Devices Type of Ortho Device: Knee Immobilizer Ortho Device/Splint Location: rle Ortho Device/Splint Interventions: Ordered, Application, Adjustment   Post Interventions Patient Tolerated: Well Instructions Provided: Care of device, Adjustment of device   Karolee Stamps 08/26/2018, 9:10 PM

## 2018-09-01 ENCOUNTER — Telehealth: Payer: Self-pay | Admitting: *Deleted

## 2018-09-01 NOTE — Telephone Encounter (Signed)
"  I want to schedule my surgery with Dr. Jacqualyn Posey."  He does surgeries on Wednesdays.  Do you have a date that you like?  "What does he have available?"  He can do it on June 18 or 24.  "Let's schedule it for June 24.  If I can do it on June 17, I'll call you back."  You need to see Dr. Jacqualyn Posey for a consultation.  Would you like me to transfer you to a scheduler?  "Yes, that will be fine."  I transferred her to Ria Comment so she could schedule an appointment with Dr. Jacqualyn Posey.  (Dr. Jacqualyn Posey, what's the procedure(s)?)

## 2018-09-01 NOTE — Telephone Encounter (Signed)
"  Is the June 17 still available for me to schedule my surgery?"  Yes, September 09, 2018 is still available.  "Let's schedule it for then.  I will see him on Monday."  I will get it scheduled.

## 2018-09-02 NOTE — Telephone Encounter (Signed)
Will likely be an EPF. Thanks.

## 2018-09-07 ENCOUNTER — Other Ambulatory Visit: Payer: Self-pay

## 2018-09-07 ENCOUNTER — Encounter: Payer: Self-pay | Admitting: Podiatry

## 2018-09-07 ENCOUNTER — Ambulatory Visit: Payer: 59 | Admitting: Podiatry

## 2018-09-07 DIAGNOSIS — M722 Plantar fascial fibromatosis: Secondary | ICD-10-CM

## 2018-09-07 MED ORDER — CEPHALEXIN 500 MG PO CAPS: 500.0000 mg | ORAL_CAPSULE | Freq: Three times a day (TID) | ORAL | 0 refills | Status: DC

## 2018-09-07 MED ORDER — OXYCODONE-ACETAMINOPHEN 5-325 MG PO TABS: 1.0000 | ORAL_TABLET | ORAL | 0 refills | Status: DC | PRN

## 2018-09-07 MED ORDER — PROMETHAZINE HCL 25 MG PO TABS: 25.0000 mg | ORAL_TABLET | Freq: Three times a day (TID) | ORAL | 0 refills | Status: DC | PRN

## 2018-09-07 MED FILL — OXYCODONE-ACETAMINOPHEN 5-3: 5-325 | 3 days supply | Qty: 25 | Fill #0

## 2018-09-07 MED FILL — CEPHALEXIN 500 MG CAPSULE: 500 | 7 days supply | Qty: 21 | Fill #0

## 2018-09-07 MED FILL — PROMETHAZINE 25 MG TABLET: 25 | 7 days supply | Qty: 20 | Fill #0

## 2018-09-07 NOTE — Telephone Encounter (Signed)
I asked Denton Ar, the daughter, to ask her mother to give me a call.  I want to make sure that we are filing UMR for her surgery that is scheduled for 09/09/2018.

## 2018-09-07 NOTE — Progress Notes (Signed)
Subjective: 48 year old female presents the office today for surgical consultation.  She states that she is having pain to both of her feet still mostly on the heels and arches of her feet.  We have discussed numerous options and she has had numerous conservative treatments but any significant resolution.  At this point she would consider surgical intervention. We discussed endoscopic plantar fascial release with PRP injection.  She states that she is been doing some research online she wants to go and proceed with surgery.  She is scheduled for this week. Denies any systemic complaints such as fevers, chills, nausea, vomiting. No acute changes since last appointment, and no other complaints at this time.   Objective: AAO x3, NAD DP/PT pulses palpable bilaterally, CRT less than 3 seconds There is continuation of tenderness to bilateral feet.  The majority of tenderness is still along the medial band plantar fashion the arch of the foot as well as on the left plantar medial tubercle of the calcaneus at the insertion of the plantar fascia.  Plantar fascial appears to be intact.  No area of tenderness.  No edema, erythema.  Negative Tinel sign. No open lesions or pre-ulcerative lesions.  No pain with calf compression, swelling, warmth, erythema  Assessment: Left chronic foot pain, plantar fascial  Plan: -All treatment options discussed with the patient including all alternatives, risks, complications.  -We again discussed both conservative as well as surgical treatment options.  This time she wished to proceed with surgery.  We also discussed her MRI results that she had previously. -We will plan for left foot endoscopic plantar fascial release, PRP injection. -The incision placement as well as the postoperative course was discussed with the patient. I discussed risks of the surgery which include, but not limited to, infection, bleeding, pain, swelling, need for further surgery, delayed or nonhealing,  painful or ugly scar, numbness or sensation changes, over/under correction, recurrence, transfer lesions, further deformity, hardware failure, DVT/PE, loss of toe/foot. Patient understands these risks and wishes to proceed with surgery. The surgical consent was reviewed with the patient all 3 pages were signed. No promises or guarantees were given to the outcome of the procedure. All questions were answered to the best of my ability. Before the surgery the patient was encouraged to call the office if there is any further questions. The surgery will be performed at the [facility] on an outpatient basis. -CAM boot dispensed for postop use.  -We are going to update her FMLA. She will be out of work starting tomorrow and for approximately 6 weeks.  -Patient encouraged to call the office with any questions, concerns, change in symptoms.   Trula Slade DPM

## 2018-09-07 NOTE — Telephone Encounter (Signed)
DOS 09/09/2018; CPT CODE: 50757 - ENDOSCOPIC PLANTAR FASCIOTOMY LEFT FOOT  UMR: Effective Date - 03/25/2018   Deductible -  Individual deductible  $0.00 to go     $300.00 out of $300         Individual integrated out-of-pocket  $7,322.05 to go     $577.95 out of $7900.00    Individual annual maximum    0 to go         0 out of N/A      Co-insurance - Benefit percentage  60%Plan pays  40%You pay  AUTHORIZATION IS NOT REQUIRED

## 2018-09-07 NOTE — Patient Instructions (Signed)

## 2018-09-09 ENCOUNTER — Encounter: Payer: Self-pay | Admitting: Podiatry

## 2018-09-09 ENCOUNTER — Telehealth: Payer: Self-pay | Admitting: *Deleted

## 2018-09-09 DIAGNOSIS — M79673 Pain in unspecified foot: Secondary | ICD-10-CM

## 2018-09-09 DIAGNOSIS — M25572 Pain in left ankle and joints of left foot: Secondary | ICD-10-CM | POA: Diagnosis not present

## 2018-09-09 DIAGNOSIS — M722 Plantar fascial fibromatosis: Secondary | ICD-10-CM

## 2018-09-09 DIAGNOSIS — I10 Essential (primary) hypertension: Secondary | ICD-10-CM | POA: Diagnosis not present

## 2018-09-09 NOTE — Telephone Encounter (Signed)
-----   Message from [place] sent at 09/09/2018 11:03 AM EDT ----- Fredia Sorrow, Dr Jacqualyn Posey asked me to let you know that the patient will be needing a knee scooter. Thanks, Lattie Haw

## 2018-09-09 NOTE — Telephone Encounter (Signed)
Faxed orders to [facility] and emailed to M. Stenson and A. Catron.

## 2018-09-11 DIAGNOSIS — M722 Plantar fascial fibromatosis: Secondary | ICD-10-CM | POA: Diagnosis not present

## 2018-09-11 DIAGNOSIS — M797 Fibromyalgia: Secondary | ICD-10-CM | POA: Diagnosis not present

## 2018-09-15 ENCOUNTER — Telehealth: Payer: Self-pay | Admitting: *Deleted

## 2018-09-15 NOTE — Telephone Encounter (Signed)
Called and spoke with the patient on Friday June 19th, 2020 and patient stated that she was doing ok and was a little sore and there was a little nausea and was still numb and I did ice and elevate and I stated that the numbness would wear off and just take your pain medicine if you need to and to call the [place] office at [phone]. Virginia Hawkins

## 2018-09-18 ENCOUNTER — Ambulatory Visit (INDEPENDENT_AMBULATORY_CARE_PROVIDER_SITE_OTHER): Payer: Self-pay | Admitting: Podiatry

## 2018-09-18 ENCOUNTER — Other Ambulatory Visit: Payer: Self-pay

## 2018-09-18 DIAGNOSIS — M722 Plantar fascial fibromatosis: Secondary | ICD-10-CM

## 2018-09-18 MED ORDER — IBUPROFEN 800 MG PO TABS: 800.0000 mg | ORAL_TABLET | Freq: Three times a day (TID) | ORAL | 0 refills | Status: DC | PRN

## 2018-09-18 MED FILL — IBUPROFEN 800 MG TABS: 800 | 10 days supply | Qty: 30 | Fill #0

## 2018-09-20 NOTE — Progress Notes (Signed)
Subjective: Virginia Hawkins is a 48 y.o. is seen today in office s/p left EPF preformed on 09/09/2018. She states that her pain is controlled. She has been wearing the CAM boot and NWB.  She does do that she is gone without the boot and it does not hurt.  Denies any systemic complaints such as fevers, chills, nausea, vomiting. No calf pain, chest pain, shortness of breath.   Objective: General: No acute distress, AAOx3  DP/PT pulses palpable 2/4, CRT < 3 sec to all digits.  Protective sensation intact. Motor function intact.  Left foot: Incision is well coapted without any evidence of dehiscence with sutures intact. There is no surrounding erythema, ascending cellulitis, fluctuance, crepitus, malodor, drainage/purulence. There is minimal edema around the surgical site. There is mild pain along the surgical site.  No other areas of tenderness to bilateral lower extremities.  No other open lesions or pre-ulcerative lesions.  No pain with calf compression, swelling, warmth, erythema.   Assessment and Plan:  Status post left EPF, doing well with no complications   -Treatment options discussed including all alternatives, risks, and complications -Discussed transition to weightbearing in the cam boot.  She is asking for solid food.  Dispensed a short cam boot. -Continue to Ice elevate. -Pain medication as needed. -Monitor for any clinical signs or symptoms of infection and DVT/PE and directed to call the office immediately should any occur or go to the ER. -Follow-up as scheduled or sooner if any problems arise. In the meantime, encouraged to call the office with any questions, concerns, change in symptoms.   Celesta Gentile, DPM

## 2018-09-21 ENCOUNTER — Ambulatory Visit: Payer: 59 | Admitting: Podiatry

## 2018-09-30 ENCOUNTER — Other Ambulatory Visit: Payer: Self-pay

## 2018-09-30 ENCOUNTER — Ambulatory Visit: Payer: 59

## 2018-09-30 ENCOUNTER — Other Ambulatory Visit: Payer: 59

## 2018-09-30 DIAGNOSIS — Z09 Encounter for follow-up examination after completed treatment for conditions other than malignant neoplasm: Secondary | ICD-10-CM

## 2018-09-30 DIAGNOSIS — M722 Plantar fascial fibromatosis: Secondary | ICD-10-CM

## 2018-10-13 ENCOUNTER — Other Ambulatory Visit: Payer: Self-pay

## 2018-10-13 ENCOUNTER — Ambulatory Visit (INDEPENDENT_AMBULATORY_CARE_PROVIDER_SITE_OTHER): Payer: Self-pay | Admitting: Podiatry

## 2018-10-13 ENCOUNTER — Encounter: Payer: Self-pay | Admitting: Podiatry

## 2018-10-13 VITALS — Temp 98.1°F

## 2018-10-13 DIAGNOSIS — M79672 Pain in left foot: Secondary | ICD-10-CM

## 2018-10-13 DIAGNOSIS — M722 Plantar fascial fibromatosis: Secondary | ICD-10-CM

## 2018-10-13 MED ORDER — GABAPENTIN 100 MG PO CAPS: 100.0000 mg | ORAL_CAPSULE | Freq: Every day | ORAL | 0 refills | Status: DC

## 2018-10-13 MED FILL — GABAPENTIN 100 MG CAPSULE: 100 | 90 days supply | Qty: 90 | Fill #0

## 2018-10-14 NOTE — Progress Notes (Signed)
Subjective: Virginia Hawkins is a 48 y.o. is seen today in office s/p left EPF preformed on 09/09/2018.  She states that since the sutures have come out she has had burning on the area and occasionally she gets tightness on the arch of her foot.  She still in the cam boot.  She states that she does go for short times without the cam boot mostly at nighttime.  No recent injury or falls.  No significant increase in swelling.  She has no new concerns otherwise today.   Denies any systemic complaints such as fevers, chills, nausea, vomiting. No calf pain, chest pain, shortness of breath.   Objective: General: No acute distress, AAOx3  DP/PT pulses palpable 2/4, CRT < 3 sec to all digits.  Protective sensation intact. Motor function intact.  Left foot: Incision is well coapted without any evidence of dehiscence and a scar is formed.  There is minimal edema to the surgical site there is no erythema or warmth.  Very minimal tenderness palpation of the plantar aspect of the heel she says overall is doing better.  She has some tightness in the arch of her foot.  There is no pain with lateral compression of calcaneus.  No discomfort elicited at this time.  No pain with calf compression, swelling, warmth, erythema.   Assessment and Plan:  Status post left EPF, doing well with no complications   -Treatment options discussed including all alternatives, risks, and complications -Antibiotic ointment was applied.  On her continuous at home as well as compression of the minimal swelling.  At this time I will start physical therapy [facility] for [facility] was written.  She is describing some sharp pains mostly at nighttime.  Start gabapentin 100 mg at nighttime.  She is been off medication previously without any side effects.  Continue to ice elevate as well.  As she starts physical therapy she starts to feel better with the start a transition back into a regular shoe.  Return in about 3 weeks  (around 11/03/2018).  Trula Slade DPM

## 2018-10-15 NOTE — Addendum Note (Signed)
Addended by: Cranford Mon R on: 10/15/2018 05:10 PM   Modules accepted: Orders

## 2018-10-22 DIAGNOSIS — M6281 Muscle weakness (generalized): Secondary | ICD-10-CM | POA: Diagnosis not present

## 2018-10-22 DIAGNOSIS — M79672 Pain in left foot: Secondary | ICD-10-CM | POA: Diagnosis not present

## 2018-10-22 DIAGNOSIS — M25572 Pain in left ankle and joints of left foot: Secondary | ICD-10-CM | POA: Diagnosis not present

## 2018-10-22 DIAGNOSIS — R262 Difficulty in walking, not elsewhere classified: Secondary | ICD-10-CM | POA: Diagnosis not present

## 2018-10-22 DIAGNOSIS — M25475 Effusion, left foot: Secondary | ICD-10-CM | POA: Diagnosis not present

## 2018-10-26 DIAGNOSIS — R262 Difficulty in walking, not elsewhere classified: Secondary | ICD-10-CM | POA: Diagnosis not present

## 2018-10-26 DIAGNOSIS — M25475 Effusion, left foot: Secondary | ICD-10-CM | POA: Diagnosis not present

## 2018-10-26 DIAGNOSIS — M79672 Pain in left foot: Secondary | ICD-10-CM | POA: Diagnosis not present

## 2018-10-26 DIAGNOSIS — M6281 Muscle weakness (generalized): Secondary | ICD-10-CM | POA: Diagnosis not present

## 2018-10-26 DIAGNOSIS — M25572 Pain in left ankle and joints of left foot: Secondary | ICD-10-CM | POA: Diagnosis not present

## 2018-10-28 ENCOUNTER — Telehealth: Payer: Self-pay | Admitting: *Deleted

## 2018-10-28 DIAGNOSIS — M722 Plantar fascial fibromatosis: Secondary | ICD-10-CM

## 2018-10-28 DIAGNOSIS — M79672 Pain in left foot: Secondary | ICD-10-CM

## 2018-10-28 NOTE — Telephone Encounter (Signed)
PT referral changed to [facility] due to insurance coverage. Faxed PT referrall, demographics to [facility].

## 2018-10-30 ENCOUNTER — Encounter: Payer: 59 | Admitting: Podiatry

## 2018-10-30 NOTE — Progress Notes (Signed)
Patient is here today for follow-up appointment, recent procedure performed on 09/09/2018, EPF left foot.  She states that overall her foot is feeling really well, she does have a little achiness and soreness in the arch, but she continues to wear her boot and manage the pain with over-the-counter ibuprofen.  Noted well-healing incision sites, sutures were removed.  There was no redness, no swelling, no drainage, no other signs and symptoms of infection.  She did have some mild swelling on the medial side of her heel, but this was within normal limits for postoperative status.  Advised patient to remain in her boot, she could get her foot wet shower with soap and water but do not put anything on the incisions with the exception of Neosporin and a Band-Aid if needed.  She is to follow-up in 2 weeks with Dr. Carman Ching.

## 2018-11-02 ENCOUNTER — Ambulatory Visit: Payer: 59 | Attending: Podiatry | Admitting: Physical Therapy

## 2018-11-02 ENCOUNTER — Encounter: Payer: Self-pay | Admitting: Physical Therapy

## 2018-11-02 ENCOUNTER — Other Ambulatory Visit: Payer: Self-pay

## 2018-11-02 ENCOUNTER — Ambulatory Visit (INDEPENDENT_AMBULATORY_CARE_PROVIDER_SITE_OTHER): Payer: 59 | Admitting: Podiatry

## 2018-11-02 VITALS — Temp 97.6°F

## 2018-11-02 DIAGNOSIS — R2689 Other abnormalities of gait and mobility: Secondary | ICD-10-CM

## 2018-11-02 DIAGNOSIS — M25572 Pain in left ankle and joints of left foot: Secondary | ICD-10-CM

## 2018-11-02 DIAGNOSIS — M722 Plantar fascial fibromatosis: Secondary | ICD-10-CM

## 2018-11-02 NOTE — Therapy (Signed)
[place], [place], [address] Phone: [phone]   Fax:  [phone]  Physical Therapy Evaluation  Patient Details  Name: Virginia Hawkins MRN: [id number] Date of Birth: 04-29-1970 Referring Provider (PT): Dr Annice Needy    Encounter Date: 11/02/2018  PT End of Session - 11/02/18 1446    Visit Number  1    Number of Visits  12    Date for PT Re-Evaluation  12/14/18    Authorization Type  [employer]    PT Start Time  1023    PT Stop Time  1100    PT Time Calculation (min)  37 min    Activity Tolerance  Patient tolerated treatment well    Behavior During Therapy  [facility] for tasks assessed/performed       Past Medical History:  Diagnosis Date  . Fibromyalgia   . Ovarian cyst   . Vaginal Pap smear, abnormal     Past Surgical History:  Procedure Laterality Date  . CYST EXCISION    . SALPINGECTOMY      There were no vitals filed for this visit.   Subjective Assessment - 11/02/18 1027    Subjective  Patient had left plantar facia release on 09/09/2018. She has been in a boot since that point. She conintues to have pain on the inside of her foot in weight bearing. she is to progress out of the boot over the next 4 weeks.    Limitations  Standing;Walking    How long can you stand comfortably?  30-45 minutes before she has to sit in the boot    How long can you walk comfortably?  Can make it around but is limping and pain    Diagnostic tests  Nothing post op    Patient Stated Goals  to be able to walk without the boot.    Currently in Pain?  Yes    Pain Score  5     Pain Location  Foot    Pain Orientation  Left;Medial    Pain Descriptors / Indicators  Burning;Shooting    Pain Type  Surgical pain    Pain Onset  More than a month ago    Pain Frequency  Constant    Aggravating Factors   standingm walking    Pain Relieving Factors  massaging the bottom of the foot. Ice helps but makes her leg cramp    Effect of Pain on Daily Activities  difficulty perfroming ADL's         [facility] PT Assessment - 11/02/18 0001      Assessment   Medical Diagnosis  Left PF releasae     Referring Provider (PT)  Dr Annice Needy     Onset Date/Surgical Date  --   09/04/2018    Hand Dominance  Right    Next MD Visit  Septemeber 10th     Prior Therapy  [facility] but [facility] of network       Precautions   Precautions  None      Restrictions   Weight Bearing Restrictions  No      Balance Screen   Has the patient fallen in the past 6 months  No    Has the patient had a decrease in activity level because of a fear of falling?   No    Is the patient reluctant to leave their home because of a fear of falling?   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     Leisure  start back walking       Cognition   Overall Cognitive Status  Within Functional Limits for tasks assessed    Attention  Focused    Focused Attention  Appears intact    Memory  Appears intact    Awareness  Appears intact    Problem Solving  Appears intact      Observation/Other Assessments   Focus on Therapeutic Outcomes (FOTO)   53% limitation       Sensation   Light Touch  Appears Intact    Additional Comments  denies parathesias       Coordination   Gross Motor Movements are Fluid and Coordinated  Yes    Fine Motor Movements are Fluid and Coordinated  Yes      ROM / Strength   AROM / PROM / Strength  AROM;PROM;Strength      AROM   AROM Assessment Site  Ankle    Right/Left Ankle  Left    Left Ankle Dorsiflexion  -3      PROM   PROM Assessment Site  Ankle    Right/Left Ankle  Left    Left Ankle Dorsiflexion  0    Left Ankle Plantar Flexion  30    Left Ankle Inversion  28    Left Ankle Eversion  13      Strength   Strength  Assessment Site  Ankle    Right/Left Ankle  Left    Left Ankle Dorsiflexion  4+/5    Left Ankle Plantar Flexion  --   unable to perfrom heel raise    Left Ankle Inversion  4+/5    Left Ankle Eversion  4+/5      Palpation   Palpation comment  tedner to palpation in the medial foot; tederness to palpation in the anterior foot      Ambulation/Gait   Gait Comments  decreased single leg stance on the left side even with boot on; lateralmovement with gait                Objective measurements completed on examination: See above findings.      [facility] PT Treatment/Exercise - 11/02/18 0001      Exercises   Exercises  Ankle      Manual Therapy   Manual Therapy  Soft tissue mobilization;Joint mobilization;Passive ROM    Joint Mobilization  AP talor glides     Soft tissue mobilization  to gastroc     Passive ROM  DF stretch       Ankle Exercises: Stretches   Other Stretch  foot stretch gently with strap 2x20 sec hold; gatroc stretch 2x20 sec hold       Ankle Exercises: Seated   Other Seated Ankle Exercises  ankle PF, DF, and ev yellow 1x10              PT Education - 11/02/18 1031    Education Details  HEP, symptom mangement    Person(s) Educated  Patient    Methods  Explanation;Demonstration;Verbal cues;Tactile cues    Comprehension  Verbalized understanding;Returned demonstration;Verbal cues required;Tactile cues required       PT Short Term Goals - 11/02/18 1614  PT SHORT TERM GOAL #1   Title  Patient will increase passive DF by 5 degrees    Time  3    Period  Weeks    Status  New    Target Date  11/23/18      PT SHORT TERM GOAL #2   Title  Patient will wear a shoe for 50% of the day without pain    Time  3    Period  Weeks    Status  New    Target Date  11/23/18      PT SHORT TERM GOAL #3   Title  Patient will deomostrate 4+/5 gross left ankle strength    Time  3    Status  New    Target Date  11/23/18        PT Long Term Goals -  11/02/18 1616      PT LONG TERM GOAL #1   Title  Pt bilateral heel pain to be no greater than a 2/10 to allow pt to walk for up to an hour in comfort for work and shopping activity.     Time  6    Period  Weeks    Status  New    Target Date  12/14/18      PT LONG TERM GOAL #2   Title  Patient will stand for 1 hour without increased pain in order to return to work    Time  6    Period  Weeks    Status  New    Target Date  12/14/18      PT LONG TERM GOAL #3   Title  Pt to be able to go up and down steps in a reciprocal manner without difficulty     Time  6    Period  Weeks    Status  New    Target Date  12/14/18             Plan - 11/02/18 1448    Clinical Impression Statement  Patient is a 48 year old female S/P plantar facia release on the left side on 09/08/2018. She presents with tenderness to palpation in the medial foot, decreased ROM, and decreased functional stability of the right foot. She has pain in the right heel too but it has improved sicne the surgery. She would benefit from skilled therapy to increase L LE strength and fucntional mobility. 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    Personal Factors and Comorbidities  Comorbidity 1    Comorbidities  right plantar facitis    Examination-Activity Limitations  Locomotion Level;Stand;Stairs    Examination-Participation Restrictions  Cleaning;Community Activity    Stability/Clinical Decision Making  Evolving/Moderate complexity    Clinical Decision Making  Low    Rehab Potential  Good    PT Frequency  2x / week    PT Duration  6 weeks    PT Treatment/Interventions  ADLs/Self Care Home Management;Cryotherapy;Electrical Stimulation;Iontophoresis 4mg /ml Dexamethasone;Moist Heat;Traction;Ultrasound;DME Instruction;Gait training;Stair training;Functional mobility training;Therapeutic activities;Therapeutic exercise;Balance training;Neuromuscular re-education;Patient/family education;Manual  techniques;Scar mobilization;Passive range of motion;Taping    PT Next Visit Plan  continue with manual therapy to improve DF, continue with manual therapy of the foot; add standing weight shifting; standing marching; add seated heel raise and windshield wiper; consider towel scrunch    PT Home Exercise Plan  ankle pF, DF, EV redand , gastro stretch; dorasl foot stretch    Consulted and Agree  with Plan of Care  Patient       Patient will benefit from skilled therapeutic intervention in order to improve the following deficits and impairments:  Abnormal gait, Difficulty walking, Decreased endurance, Decreased mobility, Decreased activity tolerance, Pain, Decreased range of motion, Improper body mechanics, Decreased safety awareness, Decreased strength  Visit Diagnosis: 1. Pain in left ankle and joints of left foot   2. Other abnormalities of gait and mobility        Problem List Patient Active Problem List   Diagnosis Date Noted  . Plantar fasciitis 09/01/2017  . History of ovarian cyst 03/23/2015  . History of abnormal cervical Pap smear 03/23/2015  . Fibromyalgia 03/23/2015  . Hypertension, essential 10/05/2014  . Vitiligo 10/05/2014    Carney Living PT DPT  11/02/2018, 4:18 PM  [facility] [address] [place], [place], [address] Phone: [phone]   Fax:  [phone]  Name: Deshawna Mcneece MRN: [id number] Date of Birth: Oct 28, 1970

## 2018-11-03 ENCOUNTER — Encounter: Payer: 59 | Admitting: Podiatry

## 2018-11-03 NOTE — Progress Notes (Signed)
Subjective: Virginia Hawkins is a 48 y.o. is seen today in office s/p left EPF preformed on 09/09/2018.  Overall she states that the pain of the heel is doing better but she still gets some sharp pain.  She has not been able to start physical therapy due to insurance and she is starting today.  She is still in the cam boot.  She did try to go into a shoe on her own but she was not quite ready.  The gabapentin was not helpful sharp pains.   Denies any systemic complaints such as fevers, chills, nausea, vomiting. No calf pain, chest pain, shortness of breath.   Objective: General: No acute distress, AAOx3  DP/PT pulses palpable 2/4, CRT < 3 sec to all digits.  Protective sensation intact. Motor function intact.  Left foot: Incision is well coapted without any evidence of dehiscence and a scar is formed.  Subjectively she states that she gets some discomfort on the course of the scars however no pain today.  Also I am not able to elicit any area tenderness plantar aspect of foot this time.  She states that she has more discomfort towards the end of the day.  No pain with calf compression, swelling, warmth, erythema.   Assessment and Plan:  Status post left EPF, improving  -Treatment options discussed including all alternatives, risks, and complications -Although she is having discomfort she is not yet started physical therapy.  She is starting today.  As she progresses with therapy she can start to transition back into a regular shoe.  She is wearing a very flat flip-flop on the contralateral extremity today.  Discussed wearing more supportive shoes. -Clinically she is improved today. As she starts physical therapy this will be helpful -We will extend her out of work as she cannot do back to work at this time given discomfort and still wearing the cam boot she is on her feet all day at work.  Return in about 4 weeks (around 11/30/2018).  Trula Slade DPM

## 2018-11-10 ENCOUNTER — Other Ambulatory Visit: Payer: Self-pay

## 2018-11-10 ENCOUNTER — Encounter: Payer: Self-pay | Admitting: Physical Therapy

## 2018-11-10 ENCOUNTER — Ambulatory Visit: Payer: 59 | Admitting: Physical Therapy

## 2018-11-10 DIAGNOSIS — M25572 Pain in left ankle and joints of left foot: Secondary | ICD-10-CM

## 2018-11-10 DIAGNOSIS — R2689 Other abnormalities of gait and mobility: Secondary | ICD-10-CM | POA: Diagnosis not present

## 2018-11-10 NOTE — Therapy (Addendum)
[place], [place], [address] Phone: [phone]   Fax:  [phone]  Physical Therapy Treatment  Patient Details  Name: Virginia Hawkins MRN: [id number] Date of Birth: 1970/04/16 Referring Provider (PT): Dr Annice Needy    Encounter Date: 11/10/2018  PT End of Session - 11/10/18 1143    Visit Number  2    Number of Visits  12    Date for PT Re-Evaluation  12/14/18    Authorization Type  [facility]    PT Start Time  1100    PT Stop Time  1138    PT Time Calculation (min)  38 min    Activity Tolerance  Patient tolerated treatment well    Behavior During Therapy  [facility] for tasks assessed/performed       Past Medical History:  Diagnosis Date  . Fibromyalgia   . Ovarian cyst   . Vaginal Pap smear, abnormal     Past Surgical History:  Procedure Laterality Date  . CYST EXCISION    . SALPINGECTOMY      There were no vitals filed for this visit.  Subjective Assessment - 11/10/18 1103    Subjective  My left foot is still hurting 7/10 especially at night.    Limitations  Standing;Walking    How long can you stand comfortably?  30-45 minutes before she has to sit in the boot    Currently in Pain?  Yes    Pain Score  5     Pain Location  Foot    Pain Orientation  Left;Medial    Pain Descriptors / Indicators  Burning;Shooting    Pain Type  Surgical pain    Pain Onset  More than a month ago    Pain Frequency  Constant    Aggravating Factors   standing and walking                       [facility] Adult PT Treatment/Exercise - 11/10/18 0001      Ambulation/Gait   Pre-Gait Activities  wt shifting actiivities at counter side to side and forward /backward    Gait Comments  explanation of gait cycle and effect on surgical site with demo walking and education on proper gait mechanics,  shoe wearing and weaning from cam boot      Self-Care   Self-Care  Other Self-Care Comments    Other Self-Care  Comments   explanation of shoe wearing and weaning of cam boot  starting at 30 minutes a day and then 1 hour      Exercises   Exercises  Ankle      Manual Therapy   Manual Therapy  Soft tissue mobilization;Joint mobilization;Passive ROM    Joint Mobilization  AP talor glides     Soft tissue mobilization  IASTYM to medial foot    Passive ROM  DF stretch       Ankle Exercises: Standing   Other Standing Ankle Exercises  standing gastroc stretch 5 x 15 sec hold on     Other Standing Ankle Exercises  in standing flex great toe and lift hieel to simulate Toe off portion of Gait x 15       Ankle Exercises: Seated   Other Seated Ankle Exercises  ankle PF, DF, and ev red 1x10              PT Education - 11/10/18 1137    Education Details  Pt given  written instructions about shoe wear to wean from boot and wt shifting activities    Person(s) Educated  Patient    Methods  Explanation;Demonstration;Handout;Verbal cues    Comprehension  Verbalized understanding;Returned demonstration       PT Short Term Goals - 11/02/18 1614      PT SHORT TERM GOAL #1   Title  Patient will increase passive DF by 5 degrees    Time  3    Period  Weeks    Status  New    Target Date  11/23/18      PT SHORT TERM GOAL #2   Title  Patient will wear a shoe for 50% of the day without pain    Time  3    Period  Weeks    Status  New    Target Date  11/23/18      PT SHORT TERM GOAL #3   Title  Patient will deomostrate 4+/5 gross left ankle strength    Time  3    Status  New    Target Date  11/23/18        PT Long Term Goals - 11/02/18 1616      PT LONG TERM GOAL #1   Title  Pt bilateral heel pain to be no greater than a 2/10 to allow pt to walk for up to an hour in comfort for work and shopping activity.     Time  6    Period  Weeks    Status  New    Target Date  12/14/18      PT LONG TERM GOAL #2   Title  Patient will stand for 1 hour without increased pain in order to return to work     Time  6    Period  Weeks    Status  New    Target Date  12/14/18      PT LONG TERM GOAL #3   Title  Pt to be able to go up and down steps in a reciprocal manner without difficulty     Time  6    Period  Weeks    Status  New    Target Date  12/14/18            Plan - 11/10/18 1310    Clinical Impression Statement  Pt returns for 2nd visit S/P plantar fascia release on left side on 09-08-18  Pt with pain 5/10 and reduced to 4/10.  Pt is motivated but anxious to wean from cam boot and wear tennis shoes.  Pt able to participate in exercise wearing shoes only this morning.    Personal Factors and Comorbidities  Comorbidity 1    Comorbidities  right plantar facitis    Examination-Activity Limitations  Locomotion Level;Stand;Stairs    Examination-Participation Restrictions  Cleaning;Community Activity    Rehab Potential  Good    PT Frequency  2x / week    PT Duration  6 weeks    PT Treatment/Interventions  ADLs/Self Care Home Management;Cryotherapy;Electrical Stimulation;Iontophoresis 4mg /ml Dexamethasone;Moist Heat;Traction;Ultrasound;DME Instruction;Gait training;Stair training;Functional mobility training;Therapeutic activities;Therapeutic exercise;Balance training;Neuromuscular re-education;Patient/family education;Manual techniques;Scar mobilization;Passive range of motion;Taping    PT Next Visit Plan  continue with manual therapy to improve DF, continue with manual therapy of the foot; add standing weight shifting; standing marching; add seated heel raise and windshield wiper; consider towel scrunch    PT Home Exercise Plan  ankle pF, DF, EV redand , gastro stretch; dorasl foot stretch weight shifting exericses and  great toe flexion    Consulted and Agree with Plan of Care  Patient       Patient will benefit from skilled therapeutic intervention in order to improve the following deficits and impairments:  Abnormal gait, Difficulty walking, Decreased endurance, Decreased mobility,  Decreased activity tolerance, Pain, Decreased range of motion, Improper body mechanics, Decreased safety awareness, Decreased strength  Visit Diagnosis: 1. Pain in left ankle and joints of left foot   2. Other abnormalities of gait and mobility        Problem List Patient Active Problem List   Diagnosis Date Noted  . Plantar fasciitis 09/01/2017  . History of ovarian cyst 03/23/2015  . History of abnormal cervical Pap smear 03/23/2015  . Fibromyalgia 03/23/2015  . Hypertension, essential 10/05/2014  . Vitiligo 10/05/2014    [name], PT Certified Exercise Expert for the Aging Adult  11/10/18 1:13 PM Phone: [phone] Fax: [facility] [address] [place], [place], [address] Phone: [phone]   Fax:  [phone]  Name: Virginia Hawkins MRN: [id number] Date of Birth: 09/27/1970

## 2018-11-10 NOTE — Patient Instructions (Signed)
Pt home program   Wear tennis shoes 30 minutes to an hour a day  Weight shift in shoes side to side and forward backward.  Gastroc stretch with added Great toe flexion  In Standing, flex great toe and lift heel to simulate Toe off portion of Gait as explained in clinic.   [name], PT Certified Exercise Expert for the Aging Adult  11/10/18 11:37 AM Phone: [phone] Fax: [phone]

## 2018-11-13 ENCOUNTER — Other Ambulatory Visit: Payer: Self-pay

## 2018-11-13 ENCOUNTER — Ambulatory Visit: Payer: 59 | Admitting: Physical Therapy

## 2018-11-13 ENCOUNTER — Encounter: Payer: Self-pay | Admitting: Physical Therapy

## 2018-11-13 DIAGNOSIS — M25572 Pain in left ankle and joints of left foot: Secondary | ICD-10-CM | POA: Diagnosis not present

## 2018-11-13 DIAGNOSIS — R2689 Other abnormalities of gait and mobility: Secondary | ICD-10-CM | POA: Diagnosis not present

## 2018-11-13 NOTE — Therapy (Signed)
[place], [place], [address] Phone: [phone]   Fax:  [phone]  Physical Therapy Treatment  Patient Details  Name: Virginia Hawkins MRN: [id number] Date of Birth: 1970-07-21 Referring Provider (PT): Dr Annice Needy    Encounter Date: 11/13/2018  PT End of Session - 11/13/18 0932    Visit Number  3    Number of Visits  12    Date for PT Re-Evaluation  12/14/18    Authorization Type  [employer]    PT Start Time  0933    PT Stop Time  1020    PT Time Calculation (min)  47 min    Activity Tolerance  Patient tolerated treatment well    Behavior During Therapy  [facility] for tasks assessed/performed       Past Medical History:  Diagnosis Date  . Fibromyalgia   . Ovarian cyst   . Vaginal Pap smear, abnormal     Past Surgical History:  Procedure Laterality Date  . CYST EXCISION    . SALPINGECTOMY      There were no vitals filed for this visit.  Subjective Assessment - 11/13/18 0937    Subjective  My pain is a 6/10 today.  I am wearing my shoes more now.    Limitations  Standing;Walking    How long can you stand comfortably?  up to 45 minutes in tennis shoes    How long can you walk comfortably?  I have to walk slowly but I am trying ot wear tennis shoes more about 15 -20 minutes    Patient Stated Goals  to be able to walk without the boot.    Currently in Pain?  Yes    Pain Score  6     Pain Location  Foot    Pain Orientation  Left;Medial    Pain Descriptors / Indicators  Sore;Sharp    Pain Type  Surgical pain    Pain Onset  More than a month ago    Pain Frequency  Intermittent         [facility] PT Assessment - 11/13/18 1032      AROM   Left Ankle Dorsiflexion  0      PROM   Left Ankle Dorsiflexion  2    Left Ankle Plantar Flexion  40    Left Ankle Inversion  30    Left Ankle Eversion  14                   [facility] Adult PT Treatment/Exercise - 11/13/18 0001      Ultrasound    Ultrasound Location  medial foot left    Ultrasound Parameters  100% 2.0 w/cm2 for 8 minutes with [place] gel    Ultrasound Goals  Pain      Manual Therapy   Manual Therapy  Soft tissue mobilization;Joint mobilization;Passive ROM    Joint Mobilization  AP talor glides     Soft tissue mobilization  IASTYM to medial foot    Passive ROM  DF stretch inversion stretch      Ankle Exercises: Stretches   Other Stretch  posterior tib stretch      Ankle Exercises: Standing   SLS  Standing on green ther ex and vector left stance with right LE forward, to side and backward for 4 minutes     Braiding (Round Trip)  farmers carry with 15 lb on right side for 20 feet x 4  Other Standing Ankle Exercises  standing gastroc stretch 5 x 15 sec hold on  30 x heel raise with left foot eccentric lowering  added to HEP    Other Standing Ankle Exercises  in standing flex great toe and lift hieel to simulate Toe off portion of Gait x 15 / deadlift with 30 lb x 30        Ankle Exercises: Aerobic   Elliptical  level 3 for 4 minutes      Ankle Exercises: Seated   Other Seated Ankle Exercises  ankle PF, DF, and ev red 1x10              PT Education - 11/13/18 1042    Education Details  added strength and green t band to HEP  reinforced walking with shoes, wean from boot    Person(s) Educated  Patient    Methods  Explanation;Demonstration;Tactile cues;Verbal cues;Handout    Comprehension  Verbalized understanding;Returned demonstration       PT Short Term Goals - 11/13/18 1035      PT SHORT TERM GOAL #1   Title  Patient will increase passive DF by 5 degrees    Baseline  -3  to 2 degrees Total 5 degrees difference    Time  3    Period  Weeks    Status  Achieved    Target Date  11/23/18      PT SHORT TERM GOAL #2   Title  Patient will wear a shoe for 50% of the day without pain    Baseline  Pt now wearing shoes and entered at 6/10    Time  3    Period  Weeks    Status  On-going    Target Date   11/23/18      PT SHORT TERM GOAL #3   Title  Patient will deomostrate 4+/5 gross left ankle strength    Baseline  Pt beginning single heel lowering and bil heel raise eccentrically but not concentrically    Time  3    Period  Weeks    Status  On-going    Target Date  11/23/18        PT Long Term Goals - 11/02/18 1616      PT LONG TERM GOAL #1   Title  Pt bilateral heel pain to be no greater than a 2/10 to allow pt to walk for up to an hour in comfort for work and shopping activity.     Time  6    Period  Weeks    Status  New    Target Date  12/14/18      PT LONG TERM GOAL #2   Title  Patient will stand for 1 hour without increased pain in order to return to work    Time  6    Period  Weeks    Status  New    Target Date  12/14/18      PT LONG TERM GOAL #3   Title  Pt to be able to go up and down steps in a reciprocal manner without difficulty     Time  6    Period  Weeks    Status  New    Target Date  12/14/18            Plan - 11/13/18 1037    Clinical Impression Statement  Pt enters clinic at 6/10 and leaves with 3-4/10 pain.  Pt DF improved from -3 to 2  degrees DF ( total of 5 degrees total)  Pt now able to wear shoes and weaning from boot.  STG # 1 achieved today    Personal Factors and Comorbidities  Comorbidity 1    Comorbidities  right plantar facitis    Examination-Activity Limitations  Locomotion Level;Stand;Stairs    Examination-Participation Restrictions  Cleaning;Community Activity    Stability/Clinical Decision Making  Evolving/Moderate complexity    Rehab Potential  Good    PT Frequency  2x / week    PT Duration  6 weeks    PT Treatment/Interventions  ADLs/Self Care Home Management;Cryotherapy;Electrical Stimulation;Iontophoresis 4mg /ml Dexamethasone;Moist Heat;Traction;Ultrasound;DME Instruction;Gait training;Stair training;Functional mobility training;Therapeutic activities;Therapeutic exercise;Balance training;Neuromuscular  re-education;Patient/family education;Manual techniques;Scar mobilization;Passive range of motion;Taping    PT Next Visit Plan  continue with manual therapy to improve DF,; add standing weight shifting; standing marching; and windshield wiper; consider towel scrunch Prepare for beach trip walking on sand standing DF stretch    PT Home Exercise Plan  ankle pF, DF, EV redand , gastro stretch; dorasl foot stretch weight shifting exericses and great toe flexion ther ex pad standing, deadlift and farmers carry for strengtheing. added green t band to exericses    Consulted and Agree with Plan of Care  Patient       Patient will benefit from skilled therapeutic intervention in order to improve the following deficits and impairments:  Abnormal gait, Difficulty walking, Decreased endurance, Decreased mobility, Decreased activity tolerance, Pain, Decreased range of motion, Improper body mechanics, Decreased safety awareness, Decreased strength  Visit Diagnosis: Pain in left ankle and joints of left foot  Other abnormalities of gait and mobility     Problem List Patient Active Problem List   Diagnosis Date Noted  . Plantar fasciitis 09/01/2017  . History of ovarian cyst 03/23/2015  . History of abnormal cervical Pap smear 03/23/2015  . Fibromyalgia 03/23/2015  . Hypertension, essential 10/05/2014  . Vitiligo 10/05/2014    [name], PT Certified Exercise Expert for the Aging Adult  11/13/18 10:44 AM Phone: [phone] Fax: [facility] [address] [place], [place], [address] Phone: [phone]   Fax:  [phone]  Name: Virginia Hawkins MRN: [id number] Date of Birth: 1971-01-07

## 2018-11-13 NOTE — Patient Instructions (Signed)
     [name], PT Certified Exercise Expert for the Aging Adult  11/13/18 10:14 AM Phone: [phone] Fax: [phone]

## 2018-11-17 ENCOUNTER — Encounter: Payer: Self-pay | Admitting: Physical Therapy

## 2018-11-17 ENCOUNTER — Other Ambulatory Visit: Payer: Self-pay

## 2018-11-17 ENCOUNTER — Ambulatory Visit: Payer: 59 | Admitting: Physical Therapy

## 2018-11-17 DIAGNOSIS — M25572 Pain in left ankle and joints of left foot: Secondary | ICD-10-CM | POA: Diagnosis not present

## 2018-11-17 DIAGNOSIS — R2689 Other abnormalities of gait and mobility: Secondary | ICD-10-CM

## 2018-11-17 NOTE — Therapy (Signed)
[place], [place], [address] Phone: [phone]   Fax:  [phone]  Physical Therapy Treatment  Patient Details  Name: Virginia Hawkins MRN: [id number] Date of Birth: 21-May-1970 Referring Provider (PT): Dr Annice Needy    Encounter Date: 11/17/2018  PT End of Session - 11/17/18 1247    Visit Number  4    Number of Visits  12    Date for PT Re-Evaluation  12/14/18    Authorization Type  [employer]    PT Start Time  1147    PT Stop Time  0762    PT Time Calculation (min)  48 min    Activity Tolerance  Patient tolerated treatment well    Behavior During Therapy  [facility] for tasks assessed/performed       Past Medical History:  Diagnosis Date  . Fibromyalgia   . Ovarian cyst   . Vaginal Pap smear, abnormal     Past Surgical History:  Procedure Laterality Date  . CYST EXCISION    . SALPINGECTOMY      There were no vitals filed for this visit.  Subjective Assessment - 11/17/18 1149    Subjective  after last visit , I had a lot of pain later on after RX.  I am able to stand for 30 minutes but no more in a shoe.  I am going to the beach on thursday.    How long can you stand comfortably?  up to 45 minutes in tennis shoes         [facility] PT Assessment - 11/17/18 0001      PROM   Left Ankle Dorsiflexion  5                   [facility] Adult PT Treatment/Exercise - 11/17/18 1200      Self-Care   Self-Care  Other Self-Care Comments    Other Self-Care Comments   self care for tennis ball and frozen water bottle for added stretch on plantar surface of left foot      Neuro Re-ed    Neuro Re-ed Details   use of green ther ex pad, bosu ball and non compliant/ compliant surfaces with SLS and vectors to simulate beach/       Iontophoresis   Type of Iontophoresis  Dexamethasone    Location  left medial and plantar surface    Dose  1cc patch    Time  4 hours patch to be removed at 4:30 PM      Manual Therapy   Manual Therapy  Soft tissue mobilization;Joint mobilization;Passive ROM    Joint Mobilization  AP talor glides     Soft tissue mobilization  IASTYM to medial foot    Passive ROM  DF stretch inversion stretch. PF stretching      Ankle Exercises: Standing   SLS  Standing on green ther ex and vector left stance with right LE forward, to side and backward for 4 minutes     Tai Chi  single leg  hip hinging with alteranating hand to floor reach 2 x 10,     Other Standing Ankle Exercises  standing Single limb left heel raises x 30   pain in medial foot   Other Standing Ankle Exercises  forward step down x 15 with 4 inch step L LE remains on step. 4 inch step and then 15 times on 6 inch step. not able to go more than 1/2 way  Ankle Exercises: Seated   Other Seated Ankle Exercises  ankle PF, DF, and ev blue t band 1x10              PT Education - 11/17/18 1220    Education Details  added to HEP for standing exercise  / neuro reed    Person(s) Educated  Patient    Methods  Explanation;Demonstration;Tactile cues;Verbal cues;Handout    Comprehension  Verbalized understanding;Returned demonstration       PT Short Term Goals - 11/13/18 1035      PT SHORT TERM GOAL #1   Title  Patient will increase passive DF by 5 degrees    Baseline  -3  to 2 degrees Total 5 degrees difference    Time  3    Period  Weeks    Status  Achieved    Target Date  11/23/18      PT SHORT TERM GOAL #2   Title  Patient will wear a shoe for 50% of the day without pain    Baseline  Pt now wearing shoes and entered at 6/10    Time  3    Period  Weeks    Status  On-going    Target Date  11/23/18      PT SHORT TERM GOAL #3   Title  Patient will deomostrate 4+/5 gross left ankle strength    Baseline  Pt beginning single heel lowering and bil heel raise eccentrically but not concentrically    Time  3    Period  Weeks    Status  On-going    Target Date  11/23/18        PT Long Term  Goals - 11/02/18 1616      PT LONG TERM GOAL #1   Title  Pt bilateral heel pain to be no greater than a 2/10 to allow pt to walk for up to an hour in comfort for work and shopping activity.     Time  6    Period  Weeks    Status  New    Target Date  12/14/18      PT LONG TERM GOAL #2   Title  Patient will stand for 1 hour without increased pain in order to return to work    Time  6    Period  Weeks    Status  New    Target Date  12/14/18      PT LONG TERM GOAL #3   Title  Pt to be able to go up and down steps in a reciprocal manner without difficulty     Time  6    Period  Weeks    Status  New    Target Date  12/14/18            Plan - 11/17/18 1241    Clinical Impression Statement  Pt enters clinic with 6/10 pain and reduces to 3/10 at end of session.  Pt DF PROM is 5 today.  Decreased tolerance of DF activities is main problem at this point. pt is able to heel raise at least 1/2 ROM  Pt is able to wear shoes most of the time but is not pain free and often feels best without any shoes.  30-45 minutes is limit of shoe wearing at this point.  Pt educated on iontophoresis to decrease inflammation and point tenderness at surgical site    Personal Factors and Comorbidities  Comorbidity 1    Comorbidities  right plantar facitis    Examination-Activity Limitations  Locomotion Level;Stand;Stairs    Rehab Potential  Good    PT Frequency  2x / week    PT Duration  6 weeks    PT Treatment/Interventions  ADLs/Self Care Home Management;Cryotherapy;Electrical Stimulation;Iontophoresis 12m/ml Dexamethasone;Moist Heat;Traction;Ultrasound;DME Instruction;Gait training;Stair training;Functional mobility training;Therapeutic activities;Therapeutic exercise;Balance training;Neuromuscular re-education;Patient/family education;Manual techniques;Scar mobilization;Passive range of motion;Taping    PT Next Visit Plan  assess Iontophoresis. continue with manual therapy to improve DF,; add standing  weight shifting; standing marching; and windshield wiper; consider towel scrunch Prepare for beach trip walking on sand standing DF stretch    PT Home Exercise Plan  ankle pF, DF, EV redand , gastro stretch; dorasl foot stretch weight shifting exericses and great toe flexion ther ex pad standing, deadlift and farmers carry for strengtheing. added green t band to exericses    Consulted and Agree with Plan of Care  Patient       Patient will benefit from skilled therapeutic intervention in order to improve the following deficits and impairments:  Abnormal gait, Difficulty walking, Decreased endurance, Decreased mobility, Decreased activity tolerance, Pain, Decreased range of motion, Improper body mechanics, Decreased safety awareness, Decreased strength  Visit Diagnosis: Pain in left ankle and joints of left foot  Other abnormalities of gait and mobility     Problem List Patient Active Problem List   Diagnosis Date Noted  . Plantar fasciitis 09/01/2017  . History of ovarian cyst 03/23/2015  . History of abnormal cervical Pap smear 03/23/2015  . Fibromyalgia 03/23/2015  . Hypertension, essential 10/05/2014  . Vitiligo 10/05/2014    LVoncille Lo PT Certified Exercise Expert for the Aging Adult  11/17/18 12:52 PM Phone: [phone]: [facility] [address]Phone: [phone]  Fax:  [phone] Name: TLerae LanghamMRN: [id number] of Birth: [birth year]

## 2018-11-17 NOTE — Patient Instructions (Addendum)
Step Down: Anterior   From 4-6" step, reach one leg forward as far as possible, still able to return easily.  Heel to floor. Return to single leg balance. Repeat with other leg. Repeat _15 x 2___ times. Do _2___ sessions per day. At start, hold __ 5 lb to 15 lb__ pound dumbbells at: knee height. shoulder height. waist height. On return, bring weights: to waist. to shoulders. over head.  Try to not let knees buckle inward.      IONTOPHORESIS PATIENT PRECAUTIONS & CONTRAINDICATIONS:  . Redness under one or both electrodes can occur.  This characterized by a uniform redness that usually disappears within 12 hours of treatment. . Small pinhead size blisters may result in response to the drug.  Contact your physician if the problem persists more than 24 hours. . On rare occasions, iontophoresis therapy can result in temporary skin reactions such as rash, inflammation, irritation or burns.  The skin reactions may be the result of individual sensitivity to the ionic solution used, the condition of the skin at the start of treatment, reaction to the materials in the electrodes, allergies or sensitivity to dexamethasone, or a poor connection between the patch and your skin.  Discontinue using iontophoresis if you have any of these reactions and report to your therapist. . Remove the Patch or electrodes if you have any undue sensation of pain or burning during the treatment and report discomfort to your therapist. . Tell your Therapist if you have had known adverse reactions to the application of electrical current. . If using the Patch, the LED light will turn off when treatment is complete and the patch can be removed.  Approximate treatment time is 1-3 hours.  Remove the patch when light goes off or after 6 hours. . The Patch can be worn during normal activity, however excessive motion where the electrodes have been placed can cause poor contact between the skin and the electrode or uneven  electrical current resulting in greater risk of skin irritation. Marland Kitchen Keep out of the reach of children.   . DO NOT use if you have a cardiac pacemaker or any other electrically sensitive implanted device. . DO NOT use if you have a known sensitivity to dexamethasone. . DO NOT use during Magnetic Resonance Imaging (MRI). . DO NOT use over broken or compromised skin (e.g. sunburn, cuts, or acne) due to the increased risk of skin reaction. . DO NOT SHAVE over the area to be treated:  To establish good contact between the Patch and the skin, excessive hair may be clipped. . DO NOT place the Patch or electrodes on or over your eyes, directly over your heart, or brain. . DO NOT reuse the Patch or electrodes as this may cause burns to occur.  [name], PT Certified Exercise Expert for the Aging Adult  11/17/18 12:18 PM Phone: [phone] Fax: [phone]

## 2018-11-24 ENCOUNTER — Ambulatory Visit: Payer: 59 | Attending: Podiatry | Admitting: Physical Therapy

## 2018-11-24 ENCOUNTER — Other Ambulatory Visit: Payer: Self-pay

## 2018-11-24 DIAGNOSIS — R2689 Other abnormalities of gait and mobility: Secondary | ICD-10-CM | POA: Diagnosis not present

## 2018-11-24 DIAGNOSIS — M25572 Pain in left ankle and joints of left foot: Secondary | ICD-10-CM | POA: Insufficient documentation

## 2018-11-24 NOTE — Therapy (Signed)
[place], [place], [address] Phone: [phone]   Fax:  [phone]  Physical Therapy Treatment  Patient Details  Name: Virginia Hawkins MRN: [id number] Date of Birth: 18-Apr-1970 Referring Provider (PT): Dr Annice Needy    Encounter Date: 11/24/2018  PT End of Session - 11/24/18 1154    Visit Number  5    Number of Visits  12    Date for PT Re-Evaluation  12/14/18    Authorization Type  [facility]    PT Start Time  1152    PT Stop Time  1230    PT Time Calculation (min)  38 min       Past Medical History:  Diagnosis Date  . Fibromyalgia   . Ovarian cyst   . Vaginal Pap smear, abnormal     Past Surgical History:  Procedure Laterality Date  . CYST EXCISION    . SALPINGECTOMY      There were no vitals filed for this visit.  Subjective Assessment - 11/24/18 1154    Subjective  I cannot stay in my regular shoe for longer than 1 hour.   I went  to the beach and tried to do some walking. the patch helped for a couple of hours after I took it off but my pain returned. I have been having a thobbing pain since Sunday.    Limitations  Standing;Walking    How long can you stand comfortably?  1 hour in my regular shoes but no more    How long can you walk comfortably?  45 min to an hour    Diagnostic tests  Nothing post op    Patient Stated Goals  to be able to walk without the boot.  NOw would like to walk past one hour in shoes . more than that and I have pain    Currently in Pain?  Yes    Pain Score  8     Pain Location  Foot    Pain Orientation  Left;Medial    Pain Descriptors / Indicators  Throbbing;Shooting    Pain Type  Surgical pain    Pain Onset  More than a month ago    Pain Frequency  Intermittent                       [facility] Adult PT Treatment/Exercise - 11/24/18 0001      Self-Care   Self-Care  Other Self-Care Comments    Other Self-Care Comments   loosening laces to  decrease tightness in shoes and doing runners lacing to tighten at ankle      Neuro Re-ed    Neuro Re-ed Details   use of green ther ex pad, bosu ball and non compliant/ compliant surfaces with SLS and vectors       Iontophoresis   Type of Iontophoresis  Dexamethasone    Location  left medial and plantar surface    Dose  1cc patch    Time  4 hours patch to be removed at 4:30 PM      Manual Therapy   Manual Therapy  Soft tissue mobilization;Joint mobilization;Passive ROM    Joint Mobilization  AP talor glides     Soft tissue mobilization  IASTYM to medial foot    Passive ROM  DF stretch inversion stretch. PF stretching      Ankle Exercises: Stretches   Soleus Stretch  30 seconds;3 reps   left  Gastroc Stretch  30 seconds;3 reps   left   Slant Board Stretch  30 seconds;3 reps    Other Stretch  posterior tib stretch      Ankle Exercises: Standing   SLS  Standing on green ther ex and vector left stance with right LE forward, to side and backward for 4 minutes     Heel Raises  Both    30 x  left only 18 x before fatigue   Tai Chi  single leg  hip hinging with alteranating hand to floor reach 2 x 10,     Other Standing Ankle Exercises  green t band over feet and side stepping 20 ft x 2     Other Standing Ankle Exercises  forward step down x 15 with 4 inch step L LE remains on step. 4 inch step , deadlift with 45 # 15 times              PT Education - 11/24/18 1240    Education Details  self care for shoe lacing and runners lacing to decrease tightness.  worked on Designer, fashion/clothing re ed for ankle and strengtheningk    Person(s) Educated  Patient    Methods  Explanation;Demonstration    Comprehension  Verbalized understanding;Returned demonstration       PT Short Term Goals - 11/13/18 1035      PT SHORT TERM GOAL #1   Title  Patient will increase passive DF by 5 degrees    Baseline  -3  to 2 degrees Total 5 degrees difference    Time  3    Period  Weeks    Status  Achieved     Target Date  11/23/18      PT SHORT TERM GOAL #2   Title  Patient will wear a shoe for 50% of the day without pain    Baseline  Pt now wearing shoes and entered at 6/10    Time  3    Period  Weeks    Status  On-going    Target Date  11/23/18      PT SHORT TERM GOAL #3   Title  Patient will deomostrate 4+/5 gross left ankle strength    Baseline  Pt beginning single heel lowering and bil heel raise eccentrically but not concentrically    Time  3    Period  Weeks    Status  On-going    Target Date  11/23/18        PT Long Term Goals - 11/02/18 1616      PT LONG TERM GOAL #1   Title  Pt bilateral heel pain to be no greater than a 2/10 to allow pt to walk for up to an hour in comfort for work and shopping activity.     Time  6    Period  Weeks    Status  New    Target Date  12/14/18      PT LONG TERM GOAL #2   Title  Patient will stand for 1 hour without increased pain in order to return to work    Time  6    Period  Weeks    Status  New    Target Date  12/14/18      PT LONG TERM GOAL #3   Title  Pt to be able to go up and down steps in a reciprocal manner without difficulty     Time  6    Period  Weeks    Status  New    Target Date  12/14/18            Plan - 11/24/18 1243    Clinical Impression Statement  Pt enters clinic after beach trip, 8/10 since Sunday.  Pt reports not able to wear normal shoes longer than 1 hour.  Pt is trying to see DPM sooner than next week if possible.  Pt reports than Iontophoresis was better and last 2-3 hours after taking patch off but pain returned.  Will try short course.  Pt was taught how to lace shoes to decrease tightness in instep.  Will continue with plan of care and check goals next visit    Personal Factors and Comorbidities  Comorbidity 1    Comorbidities  right plantar facitis    Examination-Activity Limitations  Locomotion Level;Stand;Stairs    Examination-Participation Restrictions  Cleaning;Community Activity    Rehab  Potential  Good    PT Frequency  2x / week    PT Treatment/Interventions  ADLs/Self Care Home Management;Cryotherapy;Electrical Stimulation;Iontophoresis 2m/ml Dexamethasone;Moist Heat;Traction;Ultrasound;DME Instruction;Gait training;Stair training;Functional mobility training;Therapeutic activities;Therapeutic exercise;Balance training;Neuromuscular re-education;Patient/family education;Manual techniques;Scar mobilization;Passive range of motion;Taping    PT Next Visit Plan  . continue with manual therapy to improve DF  Add loading and standing exericises    PT Home Exercise Plan  ankle pF, DF, EV redand , gastro stretch; dorasl foot stretch weight shifting exericses and great toe flexion ther ex pad standing, deadlift and farmers carry for strengtheing. added green t band to exericses    Consulted and Agree with Plan of Care  Patient       Patient will benefit from skilled therapeutic intervention in order to improve the following deficits and impairments:  Abnormal gait, Difficulty walking, Decreased endurance, Decreased mobility, Decreased activity tolerance, Pain, Decreased range of motion, Improper body mechanics, Decreased safety awareness, Decreased strength  Visit Diagnosis: Pain in left ankle and joints of left foot  Other abnormalities of gait and mobility     Problem List Patient Active Problem List   Diagnosis Date Noted  . Plantar fasciitis 09/01/2017  . History of ovarian cyst 03/23/2015  . History of abnormal cervical Pap smear 03/23/2015  . Fibromyalgia 03/23/2015  . Hypertension, essential 10/05/2014  . Vitiligo 10/05/2014   LVoncille Lo PT Certified Exercise Expert for the Aging Adult  11/24/18 12:47 PM Phone: [phone]: 3[facility] [address]Phone: [phone]  Fax:  [phone] Name: Virginia XinMRN: [id number] of Birth:  1March 13, 1972

## 2018-11-26 ENCOUNTER — Ambulatory Visit: Payer: 59 | Admitting: Physical Therapy

## 2018-11-27 ENCOUNTER — Ambulatory Visit: Payer: 59 | Admitting: Physical Therapy

## 2018-11-27 ENCOUNTER — Other Ambulatory Visit: Payer: Self-pay

## 2018-11-27 ENCOUNTER — Encounter: Payer: Self-pay | Admitting: Physical Therapy

## 2018-11-27 DIAGNOSIS — R2689 Other abnormalities of gait and mobility: Secondary | ICD-10-CM | POA: Diagnosis not present

## 2018-11-27 DIAGNOSIS — M25572 Pain in left ankle and joints of left foot: Secondary | ICD-10-CM | POA: Diagnosis not present

## 2018-11-27 NOTE — Therapy (Signed)
[place], [place], [address] Phone: [phone]   Fax:  [phone]  Physical Therapy Treatment  Patient Details  Name: Virginia Hawkins MRN: [id number] Date of Birth: Jul 26, 1970 Referring Provider (PT): Dr Annice Needy    Encounter Date: 11/27/2018  PT End of Session - 11/27/18 1211    Visit Number  6    Date for PT Re-Evaluation  12/14/18    Authorization Type  [facility]    PT Start Time  1106    PT Stop Time  1155    PT Time Calculation (min)  49 min    Activity Tolerance  Patient tolerated treatment well    Behavior During Therapy  [facility] for tasks assessed/performed       Past Medical History:  Diagnosis Date  . Fibromyalgia   . Ovarian cyst   . Vaginal Pap smear, abnormal     Past Surgical History:  Procedure Laterality Date  . CYST EXCISION    . SALPINGECTOMY      There were no vitals filed for this visit.  Subjective Assessment - 11/27/18 1110    Subjective  I could not get in early with my podiatrist because of the holiday.  I am a 6/10 today.  I brought a compression sock to help with exercise. I am still having sharp shooting pains. I did go out yesterday but I wasnt in my shoes longer than 45 minutes  I am         [facility] PT Assessment - 11/27/18 1141      AROM   Left Ankle Dorsiflexion  9      PROM   Left Ankle Dorsiflexion  11    Left Ankle Plantar Flexion  47    Left Ankle Inversion  32    Left Ankle Eversion  20      Strength   Left Ankle Dorsiflexion  4+/5    Left Ankle Plantar Flexion  3/5   now able to perform full range   Left Ankle Inversion  4+/5    Left Ankle Eversion  4+/5                   [facility] Adult PT Treatment/Exercise - 11/27/18 0001      Manual Therapy   Manual Therapy  Soft tissue mobilization;Joint mobilization;Passive ROM    Joint Mobilization  AP talor glides     Soft tissue mobilization  IASTYM to medial foot    Passive ROM  DF  stretch inversion stretch. PF stretching      Ankle Exercises: Stretches   Soleus Stretch  30 seconds;3 reps   left   Gastroc Stretch  30 seconds;3 reps   left   Slant Board Stretch  30 seconds;3 reps    Other Stretch  self mob DF stretch on chair  with 10 lb wt on quad and stretch forward, side to side 30 x      Ankle Exercises: Standing   Heel Raises  --   30 reps bil, 20 x eccentric left lowering   Tai Chi  single leg  hip hinging with alteranating hand to floor reach 2 x 10,     Other Standing Ankle Exercises  blue t band over feet and side stepping 20 ft x 2     Other Standing Ankle Exercises  forward step up 10 x 2 and lateral step up 10 x 2  PT Education - 11/27/18 1138    Education Details  added to HEP and reviewed some HEP    Person(s) Educated  Patient    Methods  Explanation;Demonstration;Tactile cues;Verbal cues;Handout    Comprehension  Verbalized understanding;Returned demonstration       PT Short Term Goals - 11/27/18 1211      PT SHORT TERM GOAL #1   Title  Patient will increase passive DF by 5 degrees    Baseline  Pt with 9 degrees DF    Time  3    Period  Weeks    Status  Achieved    Target Date  11/23/18      PT SHORT TERM GOAL #2   Title  Patient will wear a shoe for 50% of the day without pain    Baseline  Pt now wearing shoes but only up to one hour of time.  On 3 hour wearing schedule, wearing as long (1 hour ) and off 2 hours    Time  3    Period  Weeks    Status  On-going      PT SHORT TERM GOAL #3   Title  Patient will deomostrate 4+/5 gross left ankle strength    Baseline  Pt 4+/5 but left heel raise 3/5    Time  3    Period  Weeks    Status  On-going        PT Long Term Goals - 11/27/18 1214      PT LONG TERM GOAL #1   Title  Pt bilateral heel pain to be no greater than a 2/10 to allow pt to walk for up to an hour in comfort for work and shopping activity.     Baseline  Pt 5/10 pain    Time  6    Period  Weeks     Status  On-going      PT LONG TERM GOAL #2   Title  Patient will stand for 1 hour without increased pain in order to return to work    Baseline  Pt can only stand in shoes for 1 hour. Pt with no pain barefoot    Time  6    Period  Weeks    Status  On-going      PT LONG TERM GOAL #3   Title  Pt to be able to go up and down steps in a reciprocal manner without difficulty     Baseline  Pt was going up steps sideways but now able to ascend /steps with normal gait    Time  6    Period  Weeks    Status  Achieved            Plan - 11/27/18 1215    Clinical Impression Statement  Pt enters clinic with 5/10 pain.  Pt can only tolerate 1 hour in shoes. Compression tend to increase pain.  Pt is more comfortable barefoot at home.  Ms Vazguez on every 3 hour wearing schedule. starting.  Pt with LTG # 3 able to go up and down steps normally. AROM DF 9 degrees, PROM DF11, PF 47, INV 32, EV20 . Pt still has pain post to surgical incision with compression.  continue POC    Personal Factors and Comorbidities  Comorbidity 1    Comorbidities  right plantar facitis    Examination-Activity Limitations  Locomotion Level;Stand;Stairs    Examination-Participation Restrictions  Cleaning;Community Activity    Rehab Potential  Good  PT Frequency  2x / week    PT Duration  6 weeks    PT Treatment/Interventions  ADLs/Self Care Home Management;Cryotherapy;Electrical Stimulation;Iontophoresis 25m/ml Dexamethasone;Moist Heat;Traction;Ultrasound;DME Instruction;Gait training;Stair training;Functional mobility training;Therapeutic activities;Therapeutic exercise;Balance training;Neuromuscular re-education;Patient/family education;Manual techniques;Scar mobilization;Passive range of motion;Taping    PT Next Visit Plan  . continue with manual therapy to improve DF  Add loading and standing exericises, talk to her about pain managment at home, shoe wear/    PT Home Exercise Plan  ankle pF, DF, EV redand , gastro  stretch; dorasl foot stretch weight shifting exericses and great toe flexion ther ex pad standing, deadlift and farmers carry for strengtheing. added green t band to exericses self mob DF on chair with 10 lb weight    Consulted and Agree with Plan of Care  Patient       Patient will benefit from skilled therapeutic intervention in order to improve the following deficits and impairments:  Abnormal gait, Difficulty walking, Decreased endurance, Decreased mobility, Decreased activity tolerance, Pain, Decreased range of motion, Improper body mechanics, Decreased safety awareness, Decreased strength  Visit Diagnosis: Pain in left ankle and joints of left foot  Other abnormalities of gait and mobility     Problem List Patient Active Problem List   Diagnosis Date Noted  . Plantar fasciitis 09/01/2017  . History of ovarian cyst 03/23/2015  . History of abnormal cervical Pap smear 03/23/2015  . Fibromyalgia 03/23/2015  . Hypertension, essential 10/05/2014  . Vitiligo 10/05/2014   LVoncille Lo PT Certified Exercise Expert for the Aging Adult  11/27/18 12:24 PM Phone: [phone]: 3[facility] LLC17[address]water RoadGBelmont [place] [address]Phone: [phone]  Fax:  [phone] Name: Virginia DelprioreMRN: [id number] of Birth: 118-Oct-1972

## 2018-11-27 NOTE — Patient Instructions (Signed)
HEP Access Code: [email or web address]  URL: [email or web address]  Date: 11/27/2018  Prepared by: [name]   Exercises  Standing Dorsiflexion Self-Mobilization on Step - 10 reps - 3 sets - 1x daily - 7x weekly  Side Stepping with Resistance at Feet - 10 reps - 3 sets - 1x daily - 7x weekly    Also First Toe slider on left, Extend hip as far as possible with 1st great toe making good contact with floot.  ( Dont arch foot)  Also tennis ball between heels for heel raises  30 x ( posterior tibialis empahsis)  Also heel raise bilaterally and eccentric lowering on left only  30 x      [name], PT Certified Exercise Expert for the Aging Adult  11/27/18 11:38 AM Phone: [phone] Fax: [phone]

## 2018-12-01 ENCOUNTER — Encounter: Payer: Self-pay | Admitting: Physical Therapy

## 2018-12-01 ENCOUNTER — Other Ambulatory Visit: Payer: Self-pay

## 2018-12-01 ENCOUNTER — Ambulatory Visit: Payer: 59 | Admitting: Physical Therapy

## 2018-12-01 DIAGNOSIS — M25572 Pain in left ankle and joints of left foot: Secondary | ICD-10-CM | POA: Diagnosis not present

## 2018-12-01 DIAGNOSIS — R2689 Other abnormalities of gait and mobility: Secondary | ICD-10-CM | POA: Diagnosis not present

## 2018-12-02 ENCOUNTER — Encounter: Payer: Self-pay | Admitting: Physical Therapy

## 2018-12-02 NOTE — Therapy (Signed)
[place], [place], [address] Phone: [phone]   Fax:  [phone]  Physical Therapy Treatment  Patient Details  Name: Virginia Hawkins MRN: [id number] Date of Birth: 12-17-1970 Referring Provider (PT): Dr Annice Needy    Encounter Date: 12/01/2018  PT End of Session - 12/01/18 1227    Visit Number  7    Number of Visits  12    Date for PT Re-Evaluation  12/14/18    Authorization Type  [facility]    PT Start Time  3545    PT Stop Time  1228    PT Time Calculation (min)  43 min    Activity Tolerance  Patient tolerated treatment well    Behavior During Therapy  [facility] for tasks assessed/performed       Past Medical History:  Diagnosis Date  . Fibromyalgia   . Ovarian cyst   . Vaginal Pap smear, abnormal     Past Surgical History:  Procedure Laterality Date  . CYST EXCISION    . SALPINGECTOMY      There were no vitals filed for this visit.  Subjective Assessment - 12/01/18 1224    Subjective  Patient continues to have consistent pain. She has increase dpain when wearing her shoes. She reports she doies have less sharp pain at night.    Limitations  Standing;Walking    How long can you stand comfortably?  1 hour in my regular shoes but no more    How long can you walk comfortably?  45 min to an hour    Diagnostic tests  Nothing post op    Patient Stated Goals  to be able to walk without the boot.  NOw would like to walk past one hour in shoes . more than that and I have pain    Currently in Pain?  Yes    Pain Score  6     Pain Location  Foot    Pain Orientation  Left;Medial    Pain Descriptors / Indicators  Throbbing;Shooting    Pain Type  Chronic pain    Pain Onset  More than a month ago    Pain Frequency  Intermittent    Aggravating Factors   standing and walking    Pain Relieving Factors  massaging the bottom of her foot.    Effect of Pain on Daily Activities  difficulty perfroming ADL's                       [facility] Adult PT Treatment/Exercise - 12/02/18 0001      Manual Therapy   Manual Therapy  Soft tissue mobilization;Joint mobilization;Passive ROM    Joint Mobilization  AP talor glides     Soft tissue mobilization  IASTYM to medial foot    Passive ROM  DF stretch inversion stretch. PF stretching      Ankle Exercises: Standing   SLS  Standing on green ther ex and vector left stance with right LE forward, to side and backward for 4 minutes     Heel Raises  --   30 reps bil, 20 x eccentric left lowering   Tai Chi  single leg  hip hinging with alteranating hand to floor reach 2 x 10,     Other Standing Ankle Exercises  blue t band over feet and side stepping 20 ft x 2     Other Standing Ankle Exercises  forward step up 10 x 2 and  lateral step up 10 x 2       Ankle Exercises: Stretches   Soleus Stretch  30 seconds;3 reps   left   Gastroc Stretch  30 seconds;3 reps   left   Slant Board Stretch  30 seconds;3 reps      Ankle Exercises: Seated   Other Seated Ankle Exercises  4 way green band x20              PT Education - 12/01/18 1226    Education Details  technique with ther-ex. Resoning behind strengtheing    Person(s) Educated  Patient    Methods  Explanation;Demonstration;Tactile cues;Verbal cues    Comprehension  Verbalized understanding;Returned demonstration;Verbal cues required;Tactile cues required       PT Short Term Goals - 11/27/18 1211      PT SHORT TERM GOAL #1   Title  Patient will increase passive DF by 5 degrees    Baseline  Pt with 9 degrees DF    Time  3    Period  Weeks    Status  Achieved    Target Date  11/23/18      PT SHORT TERM GOAL #2   Title  Patient will wear a shoe for 50% of the day without pain    Baseline  Pt now wearing shoes but only up to one hour of time.  On 3 hour wearing schedule, wearing as long (1 hour ) and off 2 hours    Time  3    Period  Weeks    Status  On-going      PT SHORT TERM GOAL  #3   Title  Patient will deomostrate 4+/5 gross left ankle strength    Baseline  Pt 4+/5 but left heel raise 3/5    Time  3    Period  Weeks    Status  On-going        PT Long Term Goals - 11/27/18 1214      PT LONG TERM GOAL #1   Title  Pt bilateral heel pain to be no greater than a 2/10 to allow pt to walk for up to an hour in comfort for work and shopping activity.     Baseline  Pt 5/10 pain    Time  6    Period  Weeks    Status  On-going      PT LONG TERM GOAL #2   Title  Patient will stand for 1 hour without increased pain in order to return to work    Baseline  Pt can only stand in shoes for 1 hour. Pt with no pain barefoot    Time  6    Period  Weeks    Status  On-going      PT LONG TERM GOAL #3   Title  Pt to be able to go up and down steps in a reciprocal manner without difficulty     Baseline  Pt was going up steps sideways but now able to ascend /steps with normal gait    Time  6    Period  Weeks    Status  Achieved            Plan - 12/01/18 1436    Clinical Impression Statement  Patient is about the same. She tolerated ther-ex and stretching well but has limited ability to stand for any period of time. She was able to tolerate high level exercises today. She had a little pain with  stability exercises. He fucntion appears to be improving but her pain is the same.    Personal Factors and Comorbidities  Comorbidity 1    Comorbidities  right plantar facitis    Examination-Activity Limitations  Locomotion Level;Stand;Stairs    Examination-Participation Restrictions  Cleaning;Community Activity    Stability/Clinical Decision Making  Evolving/Moderate complexity    Clinical Decision Making  Low    Rehab Potential  Good    PT Frequency  2x / week    PT Duration  6 weeks    PT Treatment/Interventions  ADLs/Self Care Home Management;Cryotherapy;Electrical Stimulation;Iontophoresis 84m/ml Dexamethasone;Moist Heat;Traction;Ultrasound;DME Instruction;Gait  training;Stair training;Functional mobility training;Therapeutic activities;Therapeutic exercise;Balance training;Neuromuscular re-education;Patient/family education;Manual techniques;Scar mobilization;Passive range of motion;Taping    PT Next Visit Plan  . continue with manual therapy to improve DF  Add loading and standing exericises, talk to her about pain managment at home, shoe wear/    PT Home Exercise Plan  ankle pF, DF, EV redand , gastro stretch; dorasl foot stretch weight shifting exericses and great toe flexion ther ex pad standing, deadlift and farmers carry for strengtheing. added green t band to exericses self mob DF on chair with 10 lb weight    Consulted and Agree with Plan of Care  Patient       Patient will benefit from skilled therapeutic intervention in order to improve the following deficits and impairments:  Abnormal gait, Difficulty walking, Decreased endurance, Decreased mobility, Decreased activity tolerance, Pain, Decreased range of motion, Improper body mechanics, Decreased safety awareness, Decreased strength  Visit Diagnosis: Pain in left ankle and joints of left foot  Other abnormalities of gait and mobility     Problem List Patient Active Problem List   Diagnosis Date Noted  . Plantar fasciitis 09/01/2017  . History of ovarian cyst 03/23/2015  . History of abnormal cervical Pap smear 03/23/2015  . Fibromyalgia 03/23/2015  . Hypertension, essential 10/05/2014  . Vitiligo 10/05/2014    DCarney LivingPT DPT  12/02/2018, 12:37 PM  [facility]1[address] StreetGMerritt Park N[place] [address]Phone: [phone]  Fax:  [phone] Name: TTaleigha PinsonMRN: [id number] of Birth: 112-Mar-1972

## 2018-12-03 ENCOUNTER — Encounter: Payer: Self-pay | Admitting: Podiatry

## 2018-12-03 ENCOUNTER — Ambulatory Visit: Payer: 59 | Admitting: Physical Therapy

## 2018-12-03 ENCOUNTER — Other Ambulatory Visit: Payer: Self-pay

## 2018-12-03 ENCOUNTER — Ambulatory Visit (INDEPENDENT_AMBULATORY_CARE_PROVIDER_SITE_OTHER): Payer: 59 | Admitting: Podiatry

## 2018-12-03 DIAGNOSIS — M722 Plantar fascial fibromatosis: Secondary | ICD-10-CM

## 2018-12-03 DIAGNOSIS — M79673 Pain in unspecified foot: Secondary | ICD-10-CM

## 2018-12-03 DIAGNOSIS — Z09 Encounter for follow-up examination after completed treatment for conditions other than malignant neoplasm: Secondary | ICD-10-CM

## 2018-12-03 MED ORDER — PREGABALIN 75 MG PO CAPS: 75.0000 mg | ORAL_CAPSULE | Freq: Two times a day (BID) | ORAL | 0 refills | Status: DC

## 2018-12-03 MED FILL — PREGABALIN 75 MG CAPS: 75 | 30 days supply | Qty: 60 | Fill #0

## 2018-12-07 ENCOUNTER — Ambulatory Visit: Payer: 59 | Admitting: Orthotics

## 2018-12-07 ENCOUNTER — Ambulatory Visit: Payer: 59 | Admitting: Physical Therapy

## 2018-12-07 ENCOUNTER — Encounter: Payer: Self-pay | Admitting: Physical Therapy

## 2018-12-07 ENCOUNTER — Other Ambulatory Visit: Payer: Self-pay

## 2018-12-07 DIAGNOSIS — M25572 Pain in left ankle and joints of left foot: Secondary | ICD-10-CM

## 2018-12-07 DIAGNOSIS — R2689 Other abnormalities of gait and mobility: Secondary | ICD-10-CM

## 2018-12-07 DIAGNOSIS — M79672 Pain in left foot: Secondary | ICD-10-CM

## 2018-12-07 DIAGNOSIS — M722 Plantar fascial fibromatosis: Secondary | ICD-10-CM

## 2018-12-07 NOTE — Therapy (Signed)
[place], [place], [address] Phone: [phone]   Fax:  [phone]  Physical Therapy Treatment  Patient Details  Name: Virginia Hawkins MRN: [id number] Date of Birth: Feb 18, 1971 Referring Provider (PT): Dr Annice Needy    Encounter Date: 12/07/2018  PT End of Session - 12/07/18 1145    Visit Number  8    Number of Visits  12    Date for PT Re-Evaluation  12/14/18    Authorization Type  [employer]    PT Start Time  1104    PT Stop Time  1147    PT Time Calculation (min)  43 min    Activity Tolerance  Patient tolerated treatment well    Behavior During Therapy  [facility] for tasks assessed/performed       Past Medical History:  Diagnosis Date  . Fibromyalgia   . Ovarian cyst   . Vaginal Pap smear, abnormal     Past Surgical History:  Procedure Laterality Date  . CYST EXCISION    . SALPINGECTOMY      There were no vitals filed for this visit.  Subjective Assessment - 12/07/18 1110    Subjective  Patient went to the MD and had taping down. She reportes her pain si about a 7/10. The MD reported that this was all part of the normal healing processs. He taped her foot which helped. The tape came off yesterday,    Limitations  Standing;Walking    How long can you stand comfortably?  1 hour in my regular shoes but no more    How long can you walk comfortably?  45 min to an hour    Diagnostic tests  Nothing post op    Patient Stated Goals  to be able to walk without the boot.  NOw would like to walk past one hour in shoes . more than that and I have pain    Currently in Pain?  Yes    Pain Score  7     Pain Location  Foot    Pain Orientation  Left;Medial    Pain Descriptors / Indicators  Aching    Pain Type  Chronic pain    Pain Onset  More than a month ago    Pain Frequency  Constant    Aggravating Factors   standing and walking    Effect of Pain on Daily Activities  difficulty perfroming ADL's and work  tasks                       [facility] Adult PT Treatment/Exercise - 12/07/18 0001      Manual Therapy   Manual Therapy  Soft tissue mobilization;Joint mobilization;Passive ROM;Taping    Manual therapy comments  kinesio I-strip from fore foot to start of the achillies ; Arch taping using Mconnel tape     Joint Mobilization  AP talor glides     Soft tissue mobilization  IASTYM to medial foot    Passive ROM  DF stretch inversion stretch. PF stretching      Ankle Exercises: Seated   Other Seated Ankle Exercises  heel raise to toe raise 2x15; [place] wiper x20; toe scrunch x20     Other Seated Ankle Exercises  4 way green band x20              PT Education - 12/07/18 1144    Education Details  reviewed doffoing of tape and the resoning behind the  tape.    Person(s) Educated  Patient    Methods  Explanation;Demonstration;Tactile cues;Verbal cues    Comprehension  Returned demonstration;Verbalized understanding       PT Short Term Goals - 12/07/18 1543      PT SHORT TERM GOAL #1   Title  Patient will increase passive DF by 5 degrees    Baseline  Pt with 9 degrees DF    Time  3    Status  Achieved      PT SHORT TERM GOAL #2   Title  Patient will wear a shoe for 50% of the day without pain    Baseline  Pt now wearing shoes but only up to one hour of time.  On 3 hour wearing schedule, wearing as long (1 hour ) and off 2 hours    Time  3    Period  Weeks    Status  On-going      PT SHORT TERM GOAL #3   Title  Patient will deomostrate 4+/5 gross left ankle strength    Baseline  Pt 4+/5 but left heel raise 3/5    Time  3    Period  Weeks    Status  On-going    Target Date  11/23/18        PT Long Term Goals - 11/27/18 1214      PT LONG TERM GOAL #1   Title  Pt bilateral heel pain to be no greater than a 2/10 to allow pt to walk for up to an hour in comfort for work and shopping activity.     Baseline  Pt 5/10 pain    Time  6    Period  Weeks     Status  On-going      PT LONG TERM GOAL #2   Title  Patient will stand for 1 hour without increased pain in order to return to work    Baseline  Pt can only stand in shoes for 1 hour. Pt with no pain barefoot    Time  6    Period  Weeks    Status  On-going      PT LONG TERM GOAL #3   Title  Pt to be able to go up and down steps in a reciprocal manner without difficulty     Baseline  Pt was going up steps sideways but now able to ascend /steps with normal gait    Time  6    Period  Weeks    Status  Achieved            Plan - 12/07/18 1536    Clinical Impression Statement  Therapy trialed taping today. She was taped at the MD which helped. Therapy used a mconnel arch tapin and ankniesio tape I strip to improve circulation and support. He was tedner to palpation in the medial heel today. Therapy trialed ultrasound again. She reported improved pain after treatment. Her exercises were also scaled back to seated exercises and focused ankle strengthening 2nd to a significant increase in pain after the last visit. Therapy will advance back to standing exercises as tolerated.    Personal Factors and Comorbidities  Comorbidity 1    Comorbidities  right plantar facitis    Examination-Activity Limitations  Locomotion Level;Stand;Stairs    Examination-Participation Restrictions  Cleaning;Community Activity    Stability/Clinical Decision Making  Evolving/Moderate complexity    Clinical Decision Making  Low    Rehab Potential  Good  PT Frequency  2x / week    PT Duration  6 weeks    PT Treatment/Interventions  ADLs/Self Care Home Management;Cryotherapy;Electrical Stimulation;Iontophoresis 4mg /ml Dexamethasone;Moist Heat;Traction;Ultrasound;DME Instruction;Gait training;Stair training;Functional mobility training;Therapeutic activities;Therapeutic exercise;Balance training;Neuromuscular re-education;Patient/family education;Manual techniques;Scar mobilization;Passive range of motion;Taping    PT  Next Visit Plan  continue with funtial strengtheing if patient is improved. Continue with ultrasound if benefits noted.; Continue with stretching and manual therapy.    PT Home Exercise Plan  ankle pF, DF, EV redand , gastro stretch; dorasl foot stretch weight shifting exericses and great toe flexion ther ex pad standing, deadlift and farmers carry for strengtheing. added green t band to exericses self mob DF on chair with 10 lb weight    Consulted and Agree with Plan of Care  Patient       Patient will benefit from skilled therapeutic intervention in order to improve the following deficits and impairments:  Abnormal gait, Difficulty walking, Decreased endurance, Decreased mobility, Decreased activity tolerance, Pain, Decreased range of motion, Improper body mechanics, Decreased safety awareness, Decreased strength  Visit Diagnosis: Pain in left ankle and joints of left foot  Other abnormalities of gait and mobility     Problem List Patient Active Problem List   Diagnosis Date Noted  . Plantar fasciitis 09/01/2017  . History of ovarian cyst 03/23/2015  . History of abnormal cervical Pap smear 03/23/2015  . Fibromyalgia 03/23/2015  . Hypertension, essential 10/05/2014  . Vitiligo 10/05/2014    Carney Living PT DPT  12/07/2018, 3:48 PM  [facility] [address] [place], [place], [address] Phone: [phone]   Fax:  [phone]  Name: Virginia Hawkins MRN: [id number] Date of Birth: Aug 14, 1970

## 2018-12-07 NOTE — Progress Notes (Signed)
Lowering f/o so will not be so aggressive/hug arch.

## 2018-12-08 NOTE — Progress Notes (Signed)
Subjective: Virginia Hawkins is a 48 y.o. is seen today in office s/p left EPF preformed on 09/09/2018.  She states that the heel is doing well but she still having pain to the arch of her foot.  She is starting physical therapy.  She states that she has difficulty standing for after short amount of time.  Because that she does not feel she is ready to back to work.  She gets occasional sharp pain still to the arch of the foot.  She previously on gabapentin which was not helpful.  No recent injury or falls.  Denies any systemic complaints such as fevers, chills, nausea, vomiting. No calf pain, chest pain, shortness of breath.   Objective: General: No acute distress, AAOx3  DP/PT pulses palpable 2/4, CRT < 3 sec to all digits.  Protective sensation intact. Motor function intact.  Left foot: Incision is well coapted without any evidence of dehiscence and a scar is formed.  There is no significant discomfort of the plantar aspect heel but she has been discomfort on the medial band plantar fashion the arch of the foot.  There is no area pinpoint tenderness.  No edema, erythema. No pain with calf compression, swelling, warmth, erythema.   Assessment and Plan:  Status post left EPF, improving heel pain but continued arch pain; neuritis  -Treatment options discussed including all alternatives, risks, and complications -I would like to continue physical therapy.  I want her to start with the arch supports back inside of her shoes.  Given the sharp pain, start Lyrica and see how she does with this.  Continue ice.  As of now but have returned to work on September.  Hopefully with restrictions including seated duty.  Unfortunately she cannot stand for long period of time without discomfort.  I will reevaluate her at the next appointment scheduled for September 28.  Return in about 2 weeks (around 12/17/2018).   Trula Slade DPM

## 2018-12-15 ENCOUNTER — Ambulatory Visit: Payer: 59 | Admitting: Physical Therapy

## 2018-12-15 ENCOUNTER — Other Ambulatory Visit: Payer: Self-pay

## 2018-12-15 ENCOUNTER — Encounter: Payer: Self-pay | Admitting: Physical Therapy

## 2018-12-15 DIAGNOSIS — M25572 Pain in left ankle and joints of left foot: Secondary | ICD-10-CM | POA: Diagnosis not present

## 2018-12-15 DIAGNOSIS — R2689 Other abnormalities of gait and mobility: Secondary | ICD-10-CM

## 2018-12-15 NOTE — Progress Notes (Signed)
Thanks for the update

## 2018-12-15 NOTE — Therapy (Signed)
[place], [place], [address] Phone: [phone]   Fax:  [phone]  Physical Therapy Treatment/Discharge Note  Patient Details  Name: Braley Luckenbaugh MRN: [id number] Date of Birth: Jun 06, 1970 Referring Provider (PT): Dr Annice Needy    Encounter Date: 12/15/2018  PT End of Session - 12/15/18 1149    Visit Number  9    Number of Visits  12    Date for PT Re-Evaluation  12/14/18    Authorization Type  [employer]    PT Start Time  1149    PT Stop Time  1230    PT Time Calculation (min)  41 min    Activity Tolerance  Patient tolerated treatment well    Behavior During Therapy  [facility] for tasks assessed/performed       Past Medical History:  Diagnosis Date  . Fibromyalgia   . Ovarian cyst   . Vaginal Pap smear, abnormal     Past Surgical History:  Procedure Laterality Date  . CYST EXCISION    . SALPINGECTOMY      There were no vitals filed for this visit.  Subjective Assessment - 12/15/18 1155    Subjective  5/10 pain.  I know my exercises         [facility] PT Assessment - 12/15/18 0001      Observation/Other Assessments   Focus on Therapeutic Outcomes (FOTO)   FOTO intake 55% limitation 45%  predicted 29 %      AROM   Left Ankle Dorsiflexion  10      PROM   Left Ankle Dorsiflexion  13    Left Ankle Plantar Flexion  50    Left Ankle Inversion  39    Left Ankle Eversion  28      Strength   Left Ankle Dorsiflexion  5/5    Left Ankle Plantar Flexion  5/5   now able to perform full range   Left Ankle Inversion  5/5    Left Ankle Eversion  5/5                   [facility] Adult PT Treatment/Exercise - 12/15/18 0001      Self-Care   Self-Care  Other Self-Care Comments    Other Self-Care Comments   review questions, shoe support, wearing schedule progressed , taping education      Manual Therapy   Manual Therapy  Soft tissue mobilization    Manual therapy comments  kinesio  I-strip from fore foot to start of the achillies with additional heel pad and medial arch support ; Arch taping using Mconnel tape     Soft tissue mobilization  STM active stretch all planes    Passive ROM  DF stretch inversion stretch. PF stretching      Ankle Exercises: Seated   Other Seated Ankle Exercises  heel raise to toe raise 2x15; windshield wiper x20; toe scrunch x20    able to single limb heel raise 30 x   Other Seated Ankle Exercises  4 way green band x20       Ankle Exercises: Stretches   Soleus Stretch  30 seconds;3 reps   left   Gastroc Stretch  30 seconds;3 reps   left   Slant Board Stretch  30 seconds;3 reps             PT Education - 12/15/18 1233    Education Details  dicussed DC and home care, review HEP, taping of  ankle and importance of full body stretch    Person(s) Educated  Patient    Methods  Explanation;Demonstration    Comprehension  Verbalized understanding;Returned demonstration       PT Short Term Goals - 12/15/18 1202      PT SHORT TERM GOAL #1   Title  Patient will increase passive DF by 5 degrees    Baseline  Pt with 13 PROM degrees DF    Time  3    Period  Weeks    Status  Achieved    Target Date  11/23/18      PT SHORT TERM GOAL #2   Title  Patient will wear a shoe for 50% of the day without pain    Baseline  Pt now wearing shoes but only up 2 hours at a time.  On 3 hour wearing schedule, wearing as long (2 hour ) and off 2 hours    Time  3    Period  Weeks    Status  Partially Met    Target Date  11/23/18      PT SHORT TERM GOAL #3   Title  Patient will deomostrate 4+/5 gross left ankle strength    Baseline  Pt able to perform Single limb heel raise 30 times grossly 5/5    Time  3    Period  Weeks    Status  Achieved        PT Long Term Goals - 12/15/18 1210      PT LONG TERM GOAL #1   Title  Pt bilateral heel pain to be no greater than a 2/10 to allow pt to walk for up to an hour in comfort for work and shopping  activity.     Baseline  able to walk 1-2 hours  pain at 7/10 limited by pain    Time  6    Period  Weeks    Status  Partially Met      PT LONG TERM GOAL #2   Title  Patient will stand for 1 hour without increased pain in order to return to work    Baseline  Pt can only stand in shoes for 1 -2 hour. Pt doing sitting duty for work    Time  6    Period  Weeks    Status  Partially Met      PT LONG TERM GOAL #3   Title  Pt to be able to go up and down steps in a reciprocal manner without difficulty     Baseline  Pt able to go up and down steps normally but still has pain    Time  6    Status  Achieved            Plan - 12/15/18 1236    Clinical Impression Statement  Pt reports 5/10 pain in plantar surface and surgical incision site.  MD/pt realize it will take time.  Pt being discharged today due to pt independent with HEP and self taping of ankle for comfort. Pt DF AROM 10 degrees and PROM 13, 5/5 strenght in left ankle. Pt recieves benefit from [facility] connell taping.  Pt able to wear shoes 1-2 hours a day and off 2 hours and will continue to progress wearing schedule.Pt will continue to perform exericse within pain free range as tolerated.  She is on sitting duty at hospital. Pt has partially met all goals. Pt will be DC today.    Personal Factors and  Comorbidities  Comorbidity 1    Comorbidities  right plantar facitis    Examination-Activity Limitations  Locomotion Level;Stand;Stairs    Examination-Participation Restrictions  Cleaning;Community Activity    Stability/Clinical Decision Making  Evolving/Moderate complexity    Rehab Potential  Good    PT Frequency  2x / week    PT Duration  6 weeks    PT Treatment/Interventions  ADLs/Self Care Home Management;Cryotherapy;Electrical Stimulation;Iontophoresis 50m/ml Dexamethasone;Moist Heat;Traction;Ultrasound;DME Instruction;Gait training;Stair training;Functional mobility training;Therapeutic activities;Therapeutic exercise;Balance  training;Neuromuscular re-education;Patient/family education;Manual techniques;Scar mobilization;Passive range of motion;Taping    PT Next Visit Plan  dicharge to HEP and taping    PT Home Exercise Plan  ankle pF, DF, EV redand , gastro stretch; dorasl foot stretch weight shifting exericses and great toe flexion ther ex pad standing, deadlift and farmers carry for strengtheing. added green t band to exericses self mob DF on chair with 10 lb weight    Consulted and Agree with Plan of Care  Patient       Patient will benefit from skilled therapeutic intervention in order to improve the following deficits and impairments:  Abnormal gait, Difficulty walking, Decreased endurance, Decreased mobility, Decreased activity tolerance, Pain, Decreased range of motion, Improper body mechanics, Decreased safety awareness, Decreased strength  Visit Diagnosis: Pain in left ankle and joints of left foot  Other abnormalities of gait and mobility     Problem List Patient Active Problem List   Diagnosis Date Noted  . Plantar fasciitis 09/01/2017  . History of ovarian cyst 03/23/2015  . History of abnormal cervical Pap smear 03/23/2015  . Fibromyalgia 03/23/2015  . Hypertension, essential 10/05/2014  . Vitiligo 10/05/2014    LVoncille Lo PT Certified Exercise Expert for the Aging Adult  12/15/18 12:41 PM Phone: [phone]: 3[facility] [address]Phone: [phone]  Fax:  [phone]  PHYSICAL THERAPY DISCHARGE SUMMARY  Visits from Start of Care: 9  Current functional level related to goals / functional outcomes: As above   Remaining deficits: Pain and unable to wear shoes greater than 2 hours in shoes   Education / Equipment: HEP and taping Plan: Patient agrees to discharge.  Patient goals were partially met. Patient is being discharged due to                                                      ?????     PT discharged due to being independent with HEP and able to tape ankle /foot for home use/comfort. LVoncille Lo PT Certified Exercise Expert for the Aging Adult  12/15/18 12:43 PM Phone: [phone]: [phone]  Name: TSelma MinkMRN: [id number] of Birth: [birth year]

## 2018-12-17 ENCOUNTER — Ambulatory Visit: Payer: 59 | Admitting: Physical Therapy

## 2018-12-17 ENCOUNTER — Other Ambulatory Visit: Payer: Self-pay

## 2018-12-17 ENCOUNTER — Ambulatory Visit (INDEPENDENT_AMBULATORY_CARE_PROVIDER_SITE_OTHER): Payer: 59 | Admitting: Podiatry

## 2018-12-17 DIAGNOSIS — M722 Plantar fascial fibromatosis: Secondary | ICD-10-CM

## 2018-12-17 NOTE — Progress Notes (Signed)
Subjective: Virginia Hawkins is a 48 y.o. is seen today in office s/p left EPF preformed on 09/09/2018.  She states that she still having discomfort around her foot at times after being on her feet all day.  She states the other day she had a lot of shopping and she is on her feet for about 6 hours and at the end of the day she started the discomfort so therefore she does not feel that she can return to work at her regular duty.  She presents today wearing very flat flip-flops which actually her feet feel better. She is doing PT, taping has been helpful. Denies any systemic complaints such as fevers, chills, nausea, vomiting. No calf pain, chest pain, shortness of breath.   Objective: General: No acute distress, AAOx3  DP/PT pulses palpable 2/4, CRT < 3 sec to all digits.  Protective sensation intact. Motor function intact.  Left foot: Incision is well coapted without any evidence of dehiscence and a scar is formed.  There is no discomfort of the plantar aspect heel and there is no pain to the plantar heel but subjectively she states that she still gets discomfort.  She started get some discomfort in the incision from some scar tissue that is mildly palpable.  There is no erythema warmth or any signs of infection.  Achilles tendon appears intact. No pain with calf compression, swelling, warmth, erythema.   Assessment and Plan:  Status post left EPF, improving heel pain but continued arch pain; neuritis  -Treatment options discussed including all alternatives, risks, and complications -She will continue physical therapy. -Plantar fascial taping was applied today.  Discussed with her changing shoes.  She can look at getting new shoes.  She is scheduled to go back to work on September 29.  She will be on light duty and sitting at that time.  On October 6 she will start to stand for  6 hours a day and on October 20 she will be able to stand for about 8 hours with the hopes of returning to work full time on  November 1st.   RTC 1 week  Trula Slade DPM    -I would like to continue physical therapy.  I want her to start with the arch supports back inside of her shoes.  Given the sharp pain, start Lyrica and see how she does with this.  Continue ice.  As of now but have returned to work on September.  Hopefully with restrictions including seated duty.  Unfortunately she cannot stand for long period of time without discomfort.  I will reevaluate her at the next appointment scheduled for September 28.  Return in about 2 weeks (around 12/17/2018).   Trula Slade DPM

## 2018-12-22 ENCOUNTER — Ambulatory Visit: Payer: 59 | Admitting: Physical Therapy

## 2018-12-28 ENCOUNTER — Ambulatory Visit: Payer: 59 | Admitting: Orthotics

## 2018-12-28 ENCOUNTER — Other Ambulatory Visit: Payer: Self-pay

## 2018-12-28 DIAGNOSIS — M722 Plantar fascial fibromatosis: Secondary | ICD-10-CM

## 2018-12-28 DIAGNOSIS — M79672 Pain in left foot: Secondary | ICD-10-CM

## 2018-12-28 NOTE — Progress Notes (Signed)
Picked up correct f/o, lowered arch

## 2019-01-14 ENCOUNTER — Telehealth: Payer: Self-pay | Admitting: Podiatry

## 2019-01-14 NOTE — Telephone Encounter (Signed)
Left voicemail letting pt know her requested medical records would be placed up front for her to pick up at check in or check out from her appointment tomorrow, 10/23 with Dr. Jacqualyn Posey. Also told her if she wanted any more ov notes within the next 83 days I would be able to release those to her via MyChart if she wanted. Told pt to call with any questions.

## 2019-01-15 ENCOUNTER — Ambulatory Visit (INDEPENDENT_AMBULATORY_CARE_PROVIDER_SITE_OTHER): Payer: 59 | Admitting: Podiatry

## 2019-01-15 ENCOUNTER — Other Ambulatory Visit: Payer: Self-pay

## 2019-01-15 ENCOUNTER — Encounter: Payer: Self-pay | Admitting: Podiatry

## 2019-01-15 DIAGNOSIS — M722 Plantar fascial fibromatosis: Secondary | ICD-10-CM | POA: Diagnosis not present

## 2019-01-15 MED ORDER — IBUPROFEN 800 MG PO TABS: 800.0000 mg | ORAL_TABLET | Freq: Three times a day (TID) | ORAL | 0 refills | Status: DC | PRN

## 2019-01-15 MED FILL — IBUPROFEN 800 MG TABS: 800 | 10 days supply | Qty: 30 | Fill #0

## 2019-01-15 NOTE — Progress Notes (Signed)
Subjective: Virginia Hawkins is a 48 y.o. is seen today in office s/p left EPF preformed on 09/09/2018.  Overall she said that she still getting some discomfort.  She was doing physical therapy but she was released and she is not making much progress.  She has returned to work.  She is wearing regular shoes.  She feels the arch supports still may be causing too much support in the arch.  She is trying a steroid injection to be healed today.  She does feel the massage does help.  She denies any radiating pain or weakness to her leg.  She is previously worked up for rheumatological issues which have been negative.  Right foot is been doing better.  Denies any systemic complaints such as fevers, chills, nausea, vomiting. No calf pain, chest pain, shortness of breath.   Objective: General: No acute distress, AAOx3  DP/PT pulses palpable 2/4, CRT < 3 sec to all digits.  Protective sensation intact. Motor function intact.  Left foot: Incision is well coapted without any evidence of dehiscence and a scar is formed.  There is mild discomfort on the plantar medial aspect of the heel but also the tightness is the arch of the foot.  No significant equinus is present.  Flatfoot present. No pain with calf compression, swelling, warmth, erythema.   Assessment and Plan:  Status post left EPF, improving heel pain but continued arch pain; neuritis  -Treatment options discussed including all alternatives, risks, and complications -Steroid injection performed.  See procedure note below.  Plan fascial taping was applied today.  Prescribed ibuprofen 800 mg.  We will plan on returning to work as scheduled with possible intermittent FMLA. -Continue with supportive shoes. -We will consider referral for massage therapy and also consider over-the-counter insert of the custom inserts if not helpful.  Return in about 4 weeks (around 02/12/2019).  Trula Slade DPM

## 2019-01-25 ENCOUNTER — Other Ambulatory Visit: Payer: Self-pay | Admitting: Orthopedic Surgery

## 2019-01-25 ENCOUNTER — Other Ambulatory Visit (HOSPITAL_COMMUNITY): Payer: Self-pay | Admitting: Orthopedic Surgery

## 2019-01-25 DIAGNOSIS — M25562 Pain in left knee: Secondary | ICD-10-CM

## 2019-01-25 DIAGNOSIS — M25561 Pain in right knee: Secondary | ICD-10-CM

## 2019-01-29 ENCOUNTER — Ambulatory Visit (HOSPITAL_COMMUNITY)
Admission: RE | Admit: 2019-01-29 | Discharge: 2019-01-29 | Disposition: A | Discharge status: Home / Self Care | Payer: PRIVATE HEALTH INSURANCE | Source: Ambulatory Visit | Attending: Orthopedic Surgery | Admitting: Orthopedic Surgery

## 2019-01-29 ENCOUNTER — Other Ambulatory Visit: Payer: Self-pay

## 2019-01-29 DIAGNOSIS — M25561 Pain in right knee: Secondary | ICD-10-CM | POA: Insufficient documentation

## 2019-02-01 MED FILL — OMEPRAZOLE DR 40 MG CAPSULE: 40 | 30 days supply | Qty: 30 | Fill #1

## 2019-02-01 MED FILL — MELOXICAM 7.5 MG TABLET: 7.5 | 30 days supply | Qty: 30 | Fill #0

## 2019-02-01 MED FILL — DICLOFENAC SODIUM 1 % GEL: 1 | 30 days supply | Qty: 100 | Fill #0

## 2019-02-08 ENCOUNTER — Ambulatory Visit: Payer: 59 | Admitting: Podiatry

## 2019-02-15 ENCOUNTER — Other Ambulatory Visit: Payer: Self-pay

## 2019-02-15 ENCOUNTER — Encounter (HOSPITAL_COMMUNITY): Payer: Self-pay | Admitting: Physical Therapy

## 2019-02-15 ENCOUNTER — Ambulatory Visit (HOSPITAL_COMMUNITY): Payer: PRIVATE HEALTH INSURANCE | Attending: Orthopedic Surgery | Admitting: Physical Therapy

## 2019-02-15 DIAGNOSIS — M25561 Pain in right knee: Secondary | ICD-10-CM | POA: Diagnosis not present

## 2019-02-15 NOTE — Therapy (Signed)
[place], [place], [address] Phone: [phone]   Fax:  [phone]  Physical Therapy Evaluation  Patient Details  Name: Virginia Hawkins MRN: [id number] Date of Birth: 05-13-1970 Referring Provider (PT): Edmonia Lynch MD    Encounter Date: 02/15/2019  PT End of Session - 02/15/19 1611    Visit Number  1    Number of Visits  9    Date for PT Re-Evaluation  03/19/19    Authorization Type  [employer] [place] and 8 visits approved    Authorization Time Period  02/15/19-03/19/19    Authorization - Visit Number  1    Authorization - Number of Visits  9    PT Start Time  1300    PT Stop Time  1345    PT Time Calculation (min)  45 min    Equipment Utilized During Treatment  --   RT knee hinge brace   Activity Tolerance  Patient tolerated treatment well    Behavior During Therapy  [facility] for tasks assessed/performed       Past Medical History:  Diagnosis Date  . Fibromyalgia   . Ovarian cyst   . Vaginal Pap smear, abnormal     Past Surgical History:  Procedure Laterality Date  . CYST EXCISION    . SALPINGECTOMY      There were no vitals filed for this visit.   Subjective Assessment - 02/15/19 1312    Subjective  Patient presents to physical therapy with complaint of RT knee pain s/p slip and fall while at work on 08/26/18. 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 Patient says she landed on her RT knee. Patient has had imaging which was unremarkable for osseous or ligamentous injury. Patient says she was given a knee brace to help keep her knee cap in place. Patient reports returning to work on 01/24/19 to regular schedule. Patient is currently working full time with no stated restrictions.    Limitations  Lifting;Standing;Walking    Currently in Pain?  Yes    Pain Score  6    10/10 at worst   Pain Location  Knee    Pain Orientation   Right;Anterior    Pain Descriptors / Indicators  Aching    Pain Type  Acute pain    Pain Onset  More than a month ago    Pain Frequency  Constant    Aggravating Factors   standing, walking, stairs    Pain Relieving Factors  rest, medication    Effect of Pain on Daily Activities  Limited         [facility] PT Assessment - 02/15/19 0001      Assessment   Medical Diagnosis  Rt knee pain    Referring Provider (PT)  Edmonia Lynch MD     Onset Date/Surgical Date  08/26/18    Next MD Visit  03/01/19    Prior Therapy  No      Precautions   Precautions  None      Restrictions   Weight Bearing Restrictions  No      Balance Screen   Has the patient fallen in the past 6 months  No    Has the patient had a decrease in activity level because of a fear of falling?   No    Is the patient reluctant to leave their home because of a fear of  falling?   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    Leisure  crafting      Cognition   Overall Cognitive Status  Within Functional Limits for tasks assessed      Observation/Other Assessments   Focus on Therapeutic Outcomes (FOTO)   45% limited       Observation/Other Assessments-Edema    Edema  --   min-mod     Sensation   Light Touch  Appears Intact      ROM / Strength   AROM / PROM / Strength  AROM;Strength      AROM   Overall AROM   Within functional limits for tasks performed    AROM Assessment Site  Knee;Hip;Ankle    Right/Left Knee  Right;Left    Right Knee Extension  0    Right Knee Flexion  135    Left Knee Extension  0    Left Knee Flexion  135      Strength   Strength Assessment Site  Hip;Knee;Ankle    Right/Left Hip  Right;Left    Right Hip Flexion  4+/5    Right Hip Extension  4/5    Right Hip ABduction  4/5    Left Hip Flexion  5/5    Left Hip Extension  4+/5    Left Hip  ABduction  4+/5    Right/Left Knee  Right;Left    Right Knee Flexion  4+/5    Right Knee Extension  4+/5    Left Knee Flexion  5/5    Left Knee Extension  5/5    Right/Left Ankle  Right;Left    Right Ankle Dorsiflexion  5/5    Left Ankle Dorsiflexion  5/5      Flexibility   Soft Tissue Assessment /Muscle Length  yes    Quadriceps  Min restriction in RT quad       Palpation   Palpation comment  Min-mod tenderness to palpation about distal RT quad       Special Tests    Special Tests  Meniscus Tests    Meniscus Tests  Apley's Compression      Apley's Compression   Findings  Positive    Side   Right      Balance   Balance Assessed  --   Able to perform SLS bilaterally >20 seconds min-no sway               Objective measurements completed on examination: See above findings.              PT Education - 02/15/19 1610    Education Details  Patient educated on findings of evaluation, POC and HEP    Person(s) Educated  Patient    Methods  Explanation;Handout    Comprehension  Verbalized understanding       PT Short Term Goals - 02/15/19 1619      PT SHORT TERM GOAL #1   Title  Patient will be IND with initial HEP to improve functional outcomes    Time  2    Period  Weeks    Status  New    Target Date  03/01/19        PT Long Term Goals - 02/15/19 1620      PT LONG TERM GOAL #1  Title  Patient will improve FOTO score to <30% limited to indicate improved functional outcomes    Time  4    Period  Weeks    Status  New    Target Date  03/19/19      PT LONG TERM GOAL #2   Title  Patient will have 5/5 MMT throughout RLE to reduce RT knee pain and tolerance to functional activity at work    Time  4    Period  Weeks    Status  New    Target Date  03/19/19      PT LONG TERM GOAL #3   Title  Patient will be able to perform full shift at work with pain 3/10 to improve tolerance to functional work tasks.    Time  4    Period  Weeks    Status   New    Target Date  03/19/19      PT LONG TERM GOAL #4   Title  Patient will be able to push/ pull up to 75lb cart with proper biomechanics to reduce knee pain and improve ability to perform work specific tasks.    Time  4    Period  Weeks    Status  New    Target Date  03/19/19             Plan - 02/15/19 1613    Clinical Impression Statement  Patient is a 48 y.o. female who presents to physical therapy with complaint of RT knee pain. Patient demonstrates decreased strength, flexibility restrictions, increased tenderness to palpation and swelling which are likely contributing to symptoms of pain and are negatively impacting patient ability to perform ADLs, functional mobility and work tasks. Patient will benefit from skilled physical therapy services to address these deficits to reduce pain, improve level of function with ADLs, functional mobility and work tasks.    Examination-Activity Limitations  Stand;Locomotion Level;Squat;Stairs    Examination-Participation Restrictions  Yard Work;Community Activity;Cleaning    Stability/Clinical Decision Making  Stable/Uncomplicated    Clinical Decision Making  Low    Rehab Potential  Good    PT Frequency  2x / week    PT Duration  4 weeks    PT Treatment/Interventions  ADLs/Self Care Home Management;Biofeedback;Cryotherapy;Electrical Stimulation;Therapeutic exercise;Therapeutic activities;Functional mobility training;Stair training;Gait training;Moist Heat;DME Instruction;Balance training;Neuromuscular re-education;Patient/family education;Orthotic Fit/Training;Compression bandaging;Manual techniques;Passive range of motion;Energy conservation;Splinting;Taping;Vasopneumatic Device;Joint Manipulations    PT Next Visit Plan  Initiate treatment program. Add manual STM to address muscle tissue restricitons and pain. Progress RLE strengthening as tolerated    PT Home Exercise Plan  02/15/19: quad sets    Consulted and Agree with Plan of Care   Patient       Patient will benefit from skilled therapeutic intervention in order to improve the following deficits and impairments:  Pain, Decreased mobility, Decreased activity tolerance, Decreased strength, Hypomobility, Impaired flexibility, Increased edema, Difficulty walking  Visit Diagnosis: Acute pain of right knee     Problem List Patient Active Problem List   Diagnosis Date Noted  . Plantar fasciitis 09/01/2017  . History of ovarian cyst 03/23/2015  . History of abnormal cervical Pap smear 03/23/2015  . Fibromyalgia 03/23/2015  . Hypertension, essential 10/05/2014  . Vitiligo 10/05/2014   4:33 PM, 02/15/19 Josue Hector PT DPT  Physical Therapist with [facility]  [phone] [place] [address] [place], [place], [address] Phone: [phone]   Fax:  [phone]  Name: Nakea Krolick MRN: [id number] Date of Birth: Sep 07, 1970

## 2019-02-15 NOTE — Patient Instructions (Signed)
Access Code: [email or web address]  URL: [email or web address]  Date: 02/15/2019  Prepared by: Josue Hector   Exercises Supine Quad Set - 10 reps - 2 sets - 5 hold - 3x daily - 7x weekly

## 2019-02-17 ENCOUNTER — Encounter (HOSPITAL_COMMUNITY): Payer: 59

## 2019-02-23 ENCOUNTER — Telehealth (HOSPITAL_COMMUNITY): Payer: Self-pay | Admitting: Physical Therapy

## 2019-02-23 ENCOUNTER — Ambulatory Visit (HOSPITAL_COMMUNITY): Payer: PRIVATE HEALTH INSURANCE | Admitting: Physical Therapy

## 2019-02-23 NOTE — Telephone Encounter (Signed)
Pt is not feeling well has been [facility] for [facility] She will let us know how she feel by Monday

## 2019-02-24 DIAGNOSIS — J069 Acute upper respiratory infection, unspecified: Secondary | ICD-10-CM | POA: Diagnosis not present

## 2019-02-24 MED FILL — valACYclovir HCL 1 GM TABS: 1 | 1 days supply | Qty: 4 | Fill #0

## 2019-02-26 ENCOUNTER — Other Ambulatory Visit: Payer: Self-pay

## 2019-02-26 ENCOUNTER — Ambulatory Visit (INDEPENDENT_AMBULATORY_CARE_PROVIDER_SITE_OTHER): Payer: 59 | Admitting: Podiatry

## 2019-02-26 ENCOUNTER — Encounter (HOSPITAL_COMMUNITY): Payer: 59

## 2019-02-26 DIAGNOSIS — M722 Plantar fascial fibromatosis: Secondary | ICD-10-CM | POA: Diagnosis not present

## 2019-02-26 NOTE — Progress Notes (Signed)
Subjective: Virginia Hawkins is a 48 y.o. is seen today in office for follow-up of left heel pain, plantar fasciitis.  She is overall she is doing well she has good days and bad days.  The injection was helpful.  She agrees underwent endoscopic plantar fascial release on September 09, 2018.  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  She will have days where she has no pain in the weekdays when she gets up she has pain when she first stands up in the morning.  No recent injury or changes otherwise. Denies any systemic complaints such as fevers, chills, nausea, vomiting. No calf pain, chest pain, shortness of breath.   Objective: General: No acute distress, AAOx3  DP/PT pulses palpable 2/4, CRT < 3 sec to all digits.  Protective sensation intact. Motor function intact.  Left foot: Incisions are all well-healed with a scar.  There is mild discomfort on the plantar medial aspect of the heel as well as the plantar aspect of the foot on the arch of the foot.  No pain with lateral compression of calcaneus.  Achilles tendon appears to be intact.  Flatfoot present.  No pain on the right side. No pain with calf compression, swelling, warmth, erythema.   Assessment and Plan:  Status post left EPF, improving heel pain but continued arch pain; neuritis  -Treatment options discussed including all alternatives, risks, and complications -To be held off another injection.  Were going to start EPAT treatments (no charge) for 3 in a row.  I believe this accommodation with home stretching, rehab as well as orthotics will be helpful for her. -Accommodation will be provided for her stating that if she were to have such a severe flare she would need to ice elevate that her foot which may require her to be out of work.  Unfortunately her intermittent FMLA was denied.  Trula Slade DPM

## 2019-02-27 ENCOUNTER — Ambulatory Visit (INDEPENDENT_AMBULATORY_CARE_PROVIDER_SITE_OTHER): Payer: 59 | Admitting: *Deleted

## 2019-02-27 DIAGNOSIS — M722 Plantar fascial fibromatosis: Secondary | ICD-10-CM

## 2019-02-27 NOTE — Progress Notes (Signed)
Patient presents for the 1st EPAT  treatment today. She is status post EPF left (DOS 09/09/18). Most of her pain is located in the central arch band.   ESWT administered and tolerated well. Treatment settings initiated at:   Energy: 20  Patient was extremely sensitive when treatment was initiated. She immediately felt sharp pains to the outer edge of her foot. Energy was decreased until it was tolerable for her.  Ended treatment session today with 3000 shocks at the following settings:   Energy: 5  Frequency: 7.0  Joules: 5.709  Arch massager was utilized x 3 rounds  Advised to avoid ice and NSAIDs and to utilize boot or supportive shoes for at least the next 3 days.  Follow up for 2nd treatment in 1 week.  Dr. Jacqualyn Posey wanted her to do 3 weekly treatments and then follow back up with her.

## 2019-03-01 ENCOUNTER — Encounter (HOSPITAL_COMMUNITY): Payer: Self-pay | Admitting: Physical Therapy

## 2019-03-01 ENCOUNTER — Ambulatory Visit (HOSPITAL_COMMUNITY): Payer: PRIVATE HEALTH INSURANCE | Attending: Orthopedic Surgery | Admitting: Physical Therapy

## 2019-03-01 ENCOUNTER — Other Ambulatory Visit: Payer: Self-pay

## 2019-03-01 DIAGNOSIS — M25572 Pain in left ankle and joints of left foot: Secondary | ICD-10-CM | POA: Insufficient documentation

## 2019-03-01 DIAGNOSIS — R2689 Other abnormalities of gait and mobility: Secondary | ICD-10-CM | POA: Diagnosis present

## 2019-03-01 DIAGNOSIS — M25561 Pain in right knee: Secondary | ICD-10-CM | POA: Insufficient documentation

## 2019-03-01 MED FILL — METOPROLOL SUCCINATE ER 50: 50 | 90 days supply | Qty: 90 | Fill #0

## 2019-03-01 NOTE — Therapy (Signed)
[place], [place], [address] Phone: [phone]   Fax:  [phone]  Physical Therapy Treatment  Patient Details  Name: Virginia Hawkins MRN: [id number] Date of Birth: 1970/11/12 Referring Provider (PT): Edmonia Lynch MD    Encounter Date: 03/01/2019  PT End of Session - 03/01/19 1628    Visit Number  2    Number of Visits  9    Date for PT Re-Evaluation  03/19/19    Authorization Type  [employer] [place] and 8 visits approved    Authorization Time Period  02/15/19-03/19/19    Authorization - Visit Number  2    Authorization - Number of Visits  9    PT Start Time  1618    PT Stop Time  1658    PT Time Calculation (min)  40 min    Equipment Utilized During Treatment  --   RT knee hinge brace   Activity Tolerance  Patient tolerated treatment well    Behavior During Therapy  [facility] for tasks assessed/performed       Past Medical History:  Diagnosis Date  . Fibromyalgia   . Ovarian cyst   . Vaginal Pap smear, abnormal     Past Surgical History:  Procedure Laterality Date  . CYST EXCISION    . SALPINGECTOMY      There were no vitals filed for this visit.  Subjective Assessment - 03/01/19 1623    Subjective  States she is not in a lot of pain but that she has not been working last week. Reports she saw her MD for her plantar fascia last sesssion.    Limitations  Lifting;Standing;Walking    Currently in Pain?  Yes    Pain Score  6     Pain Location  Knee    Pain Onset  More than a month ago         [facility] PT Assessment - 03/01/19 0001      Assessment   Medical Diagnosis  Rt knee pain    Referring Provider (PT)  Edmonia Lynch MD     Onset Date/Surgical Date  08/26/18                   [facility] Adult PT Treatment/Exercise - 03/01/19 0001      Exercises   Exercises  Knee/Hip      Knee/Hip Exercises: Supine   Short Arc Quad Sets  AROM;Right;3 sets   8 reps 5" holds   Bridges  AROM;3 sets   8 reps 3" holds   Other Supine Knee/Hip Exercises  thomas stretch 3x30" right. self mobilziation with massage stick to R LE - 8 minutes    Other Supine Knee/Hip Exercises  active straightles raise 3x8, 5" holds R      Manual Therapy   Manual Therapy  Soft tissue mobilization;Joint mobilization    Manual therapy comments  all manual interventions performed independently of other interventions    Joint Mobilization  R patella mobilizations in all directions    Soft tissue mobilization  STM to right distal quad and adductors - tolerated poorly               PT Short Term Goals - 02/15/19 1619      PT SHORT TERM GOAL #1   Title  Patient will be IND with initial HEP to improve functional outcomes    Time  2    Period  Weeks  Status  New    Target Date  03/01/19        PT Long Term Goals - 02/15/19 1620      PT LONG TERM GOAL #1   Title  Patient will improve FOTO score to <30% limited to indicate improved functional outcomes    Time  4    Period  Weeks    Status  New    Target Date  03/19/19      PT LONG TERM GOAL #2   Title  Patient will have 5/5 MMT throughout RLE to reduce RT knee pain and tolerance to functional activity at work    Time  4    Period  Weeks    Status  New    Target Date  03/19/19      PT LONG TERM GOAL #3   Title  Patient will be able to perform full shift at work with pain 3/10 to improve tolerance to functional work tasks.    Time  4    Period  Weeks    Status  New    Target Date  03/19/19      PT LONG TERM GOAL #4   Title  Patient will be able to push/ pull up to 75lb cart with proper biomechanics to reduce knee pain and improve ability to perform work specific tasks.    Time  4    Period  Weeks    Status  New    Target Date  03/19/19            Plan - 03/01/19 1702    Clinical Impression Statement  Focused on table exercises today to improve LE strength. Minor pain noted along lateral aspect of knee with  SAQs. Tenderness noted along junction of ITB and quad and adductors and quad. Manual interventions not tolerated very well so transitioned to self mobilization with rolling stick, this was tolerated much better. Will continue to work on LE strength and mobility as able. Working in hip strengthening as tolerated.    Examination-Activity Limitations  Stand;Locomotion Level;Squat;Stairs    Examination-Participation Restrictions  Yard Work;Community Activity;Cleaning    Stability/Clinical Decision Making  Stable/Uncomplicated    Rehab Potential  Good    PT Frequency  2x / week    PT Duration  4 weeks    PT Treatment/Interventions  ADLs/Self Care Home Management;Biofeedback;Cryotherapy;Electrical Stimulation;Therapeutic exercise;Therapeutic activities;Functional mobility training;Stair training;Gait training;Moist Heat;DME Instruction;Balance training;Neuromuscular re-education;Patient/family education;Orthotic Fit/Training;Compression bandaging;Manual techniques;Passive range of motion;Energy conservation;Splinting;Taping;Vasopneumatic Device;Joint Manipulations    PT Next Visit Plan  Initiate treatment program. Add manual STM to address muscle tissue restricitons and pain. Progress RLE strengthening as tolerated    PT Home Exercise Plan  02/15/19: quad sets; 12/7 bridges, ASLR, SAQs, thomas stretch    Consulted and Agree with Plan of Care  Patient       Patient will benefit from skilled therapeutic intervention in order to improve the following deficits and impairments:  Pain, Decreased mobility, Decreased activity tolerance, Decreased strength, Hypomobility, Impaired flexibility, Increased edema, Difficulty walking  Visit Diagnosis: Acute pain of right knee  Pain in left ankle and joints of left foot  Other abnormalities of gait and mobility     Problem List Patient Active Problem List   Diagnosis Date Noted  . Plantar fasciitis 09/01/2017  . History of ovarian cyst 03/23/2015  . History  of abnormal cervical Pap smear 03/23/2015  . Fibromyalgia 03/23/2015  . Hypertension, essential 10/05/2014  . Vitiligo 10/05/2014  5:04 PM, 03/01/19  Jerene Pitch, DPT Physical Therapy with [facility]  [phone] office  [place] [address] [place], [place], [address] Phone: [phone]   Fax:  [phone]  Name: Virginia Hawkins MRN: [id number] Date of Birth: [birth year]

## 2019-03-05 ENCOUNTER — Other Ambulatory Visit: Payer: Self-pay

## 2019-03-05 ENCOUNTER — Ambulatory Visit (HOSPITAL_COMMUNITY): Payer: PRIVATE HEALTH INSURANCE | Admitting: Physical Therapy

## 2019-03-05 ENCOUNTER — Encounter (HOSPITAL_COMMUNITY): Payer: Self-pay | Admitting: Physical Therapy

## 2019-03-05 DIAGNOSIS — M25572 Pain in left ankle and joints of left foot: Secondary | ICD-10-CM

## 2019-03-05 DIAGNOSIS — R2689 Other abnormalities of gait and mobility: Secondary | ICD-10-CM

## 2019-03-05 DIAGNOSIS — M25561 Pain in right knee: Secondary | ICD-10-CM

## 2019-03-05 NOTE — Therapy (Signed)
[place], [place], [address] Phone: [phone]   Fax:  [phone]  Physical Therapy Treatment  Patient Details  Name: Virginia Hawkins MRN: [id number] Date of Birth: 07/14/1970 Referring Provider (PT): Edmonia Lynch MD    Encounter Date: 03/05/2019  PT End of Session - 03/05/19 1400    Visit Number  3    Number of Visits  9    Date for PT Re-Evaluation  03/19/19    Authorization Type  [employer] [place] and 8 visits approved    Authorization Time Period  02/15/19-03/19/19    Authorization - Visit Number  3    Authorization - Number of Visits  9    PT Start Time  1400    PT Stop Time  1438    PT Time Calculation (min)  38 min    Equipment Utilized During Treatment  --   RT knee hinge brace   Activity Tolerance  Patient tolerated treatment well    Behavior During Therapy  [facility] for tasks assessed/performed       Past Medical History:  Diagnosis Date  . Fibromyalgia   . Ovarian cyst   . Vaginal Pap smear, abnormal     Past Surgical History:  Procedure Laterality Date  . CYST EXCISION    . SALPINGECTOMY      There were no vitals filed for this visit.  Subjective Assessment - 03/05/19 1400    Subjective  States she has no current pain but at work after a couple hours she is in pain. STates the brace really cuts into knee and thinks it doesn't fit right. States she thinks alot of the problems is due to the cart she has to push because she pushes it and the cart goes one way and she goes the other way. STates it results in twisting her knee and really bothers it.    Limitations  Lifting;Standing;Walking    Currently in Pain?  No/denies    Pain Onset  More than a month ago         [facility] PT Assessment - 03/05/19 0001      Assessment   Medical Diagnosis  Rt knee pain    Referring Provider (PT)  Edmonia Lynch MD     Onset Date/Surgical Date  08/26/18                    [facility] Adult PT Treatment/Exercise - 03/05/19 0001      Knee/Hip Exercises: Supine   Bridges  AROM;Strengthening;3 sets;10 reps    Other Supine Knee/Hip Exercises  bridge walkout 3x5      Knee/Hip Exercises: Sidelying   Clams  3x10 bilateral      Knee/Hip Exercises: Prone   Straight Leg Raises  AROM;Strengthening;Both;3 sets;10 reps      Manual Therapy   Manual Therapy  Soft tissue mobilization;Joint mobilization    Manual therapy comments  all manual interventions performed independently of other interventions    Joint Mobilization  R patella mobilizations in all directions    Soft tissue mobilization  STM to right distal quad and adductors - tolerated better than previous session               PT Short Term Goals - 02/15/19 1619      PT SHORT TERM GOAL #1   Title  Patient will be IND with initial HEP to improve functional outcomes    Time  2  Period  Weeks    Status  New    Target Date  03/01/19        PT Long Term Goals - 02/15/19 1620      PT LONG TERM GOAL #1   Title  Patient will improve FOTO score to <30% limited to indicate improved functional outcomes    Time  4    Period  Weeks    Status  New    Target Date  03/19/19      PT LONG TERM GOAL #2   Title  Patient will have 5/5 MMT throughout RLE to reduce RT knee pain and tolerance to functional activity at work    Time  4    Period  Weeks    Status  New    Target Date  03/19/19      PT LONG TERM GOAL #3   Title  Patient will be able to perform full shift at work with pain 3/10 to improve tolerance to functional work tasks.    Time  4    Period  Weeks    Status  New    Target Date  03/19/19      PT LONG TERM GOAL #4   Title  Patient will be able to push/ pull up to 75lb cart with proper biomechanics to reduce knee pain and improve ability to perform work specific tasks.    Time  4    Period  Weeks    Status  New    Target Date  03/19/19            Plan -  03/05/19 1449    Clinical Impression Statement  Focus today on improving unweighted lower extremity strength. Mild increase in pain noted with bridges. Transitioned to manual interventions and this was tolerated better than previous session. Improved resting muscle tone noted along distal adductors, but continued tension noted throughout right quad. Will continue to work on quad lengthening and posterior strength as tolerated.    Examination-Activity Limitations  Stand;Locomotion Level;Squat;Stairs    Examination-Participation Restrictions  Yard Work;Community Activity;Cleaning    Stability/Clinical Decision Making  Stable/Uncomplicated    Rehab Potential  Good    PT Frequency  2x / week    PT Duration  4 weeks    PT Treatment/Interventions  ADLs/Self Care Home Management;Biofeedback;Cryotherapy;Electrical Stimulation;Therapeutic exercise;Therapeutic activities;Functional mobility training;Stair training;Gait training;Moist Heat;DME Instruction;Balance training;Neuromuscular re-education;Patient/family education;Orthotic Fit/Training;Compression bandaging;Manual techniques;Passive range of motion;Energy conservation;Splinting;Taping;Vasopneumatic Device;Joint Manipulations    PT Next Visit Plan  Add manual STM to address muscle tissue restricitons and pain. Progress RLE strengthening as tolerated    PT Home Exercise Plan  02/15/19: quad sets; 12/7 bridges, ASLR, SAQs, thomas stretch; 12/11 bridge add and walkout, prone hip ext and clams    Consulted and Agree with Plan of Care  Patient       Patient will benefit from skilled therapeutic intervention in order to improve the following deficits and impairments:  Pain, Decreased mobility, Decreased activity tolerance, Decreased strength, Hypomobility, Impaired flexibility, Increased edema, Difficulty walking  Visit Diagnosis: Acute pain of right knee  Pain in left ankle and joints of left foot  Other abnormalities of gait and  mobility     Problem List Patient Active Problem List   Diagnosis Date Noted  . Plantar fasciitis 09/01/2017  . History of ovarian cyst 03/23/2015  . History of abnormal cervical Pap smear 03/23/2015  . Fibromyalgia 03/23/2015  . Hypertension, essential 10/05/2014  . Vitiligo 10/05/2014    2:52  PM, 03/05/19 Jerene Pitch, DPT Physical Therapy with [facility]  [phone] office  [place] [address] [place], [place], [address] Phone: [phone]   Fax:  [phone]  Name: Virginia Hawkins MRN: [id number] Date of Birth: 07/18/1970

## 2019-03-08 ENCOUNTER — Ambulatory Visit (INDEPENDENT_AMBULATORY_CARE_PROVIDER_SITE_OTHER): Payer: Self-pay | Admitting: *Deleted

## 2019-03-08 ENCOUNTER — Other Ambulatory Visit: Payer: 59

## 2019-03-08 ENCOUNTER — Other Ambulatory Visit: Payer: Self-pay

## 2019-03-08 ENCOUNTER — Ambulatory Visit (HOSPITAL_COMMUNITY): Payer: PRIVATE HEALTH INSURANCE | Admitting: Physical Therapy

## 2019-03-08 ENCOUNTER — Telehealth (HOSPITAL_COMMUNITY): Payer: Self-pay | Admitting: Physical Therapy

## 2019-03-08 DIAGNOSIS — M79676 Pain in unspecified toe(s): Secondary | ICD-10-CM

## 2019-03-08 DIAGNOSIS — M722 Plantar fascial fibromatosis: Secondary | ICD-10-CM

## 2019-03-08 NOTE — Telephone Encounter (Signed)
Called patient and left voice message about missed appointment today at 3:15. Informed patient of Thursdays appointment and to call with any issues or needing to reschedule.    4:27 PM, 03/08/19 Josue Hector PT DPT  Physical Therapist with [facility]  [phone]

## 2019-03-08 NOTE — Progress Notes (Signed)
Patient presents for the 2nd EPAT  treatment today. She is status post EPF left (DOS 09/09/18). Most of her pain is located in the central arch band. She states improvement since her first treatment.  ESWT administered and tolerated well. Treatment settings initiated at:   Energy: 10  She was not as sensitive to treatment today as she was initially.  Ended treatment session today with 3000 shocks at the following settings:   Energy: 10  Frequency: 6.0  Joules: 9.810  Arch massager was utilized x 3 rounds  Advised to avoid ice and NSAIDs and to utilize boot or supportive shoes for at least the next 3 days.  Follow up for 3rd treatment in 1 week.  She will schedule one more EPAT and then schedule with Dr. Jacqualyn Posey in 2 weeks.

## 2019-03-11 ENCOUNTER — Encounter (HOSPITAL_COMMUNITY): Payer: Self-pay | Admitting: Physical Therapy

## 2019-03-11 ENCOUNTER — Other Ambulatory Visit: Payer: Self-pay

## 2019-03-11 ENCOUNTER — Ambulatory Visit (HOSPITAL_COMMUNITY): Payer: PRIVATE HEALTH INSURANCE | Admitting: Physical Therapy

## 2019-03-11 DIAGNOSIS — R2689 Other abnormalities of gait and mobility: Secondary | ICD-10-CM

## 2019-03-11 DIAGNOSIS — M25561 Pain in right knee: Secondary | ICD-10-CM

## 2019-03-11 NOTE — Therapy (Signed)
[place], [place], [address] Phone: [phone]   Fax:  [phone]  Physical Therapy Treatment  Patient Details  Name: Virginia Hawkins MRN: [id number] Date of Birth: 1970/05/07 Referring Provider (PT): Edmonia Lynch MD    Encounter Date: 03/11/2019  PT End of Session - 03/11/19 1749    Visit Number  4    Number of Visits  9    Date for PT Re-Evaluation  03/19/19    Authorization Type  [employer] [place] and 8 visits approved    Authorization Time Period  02/15/19-03/19/19    Authorization - Visit Number  4    Authorization - Number of Visits  9    PT Start Time  [id number]    PT Stop Time  1830    PT Time Calculation (min)  50 min    Equipment Utilized During Treatment  --   RT knee hinge brace   Activity Tolerance  Patient tolerated treatment well    Behavior During Therapy  [facility] for tasks assessed/performed       Past Medical History:  Diagnosis Date  . Fibromyalgia   . Ovarian cyst   . Vaginal Pap smear, abnormal     Past Surgical History:  Procedure Laterality Date  . CYST EXCISION    . SALPINGECTOMY      There were no vitals filed for this visit.  Subjective Assessment - 03/11/19 1748    Subjective  Patient says "it hurts". Patient says nothing is new and her knee still hurts with prolonged standing. Patient says home exercise and self-message techniques are going well.    Limitations  Lifting;Standing;Walking    Currently in Pain?  Yes    Pain Score  7     Pain Location  Knee    Pain Orientation  Right    Pain Descriptors / Indicators  Aching    Pain Type  Acute pain    Pain Onset  More than a month ago                       [facility] Adult PT Treatment/Exercise - 03/11/19 0001      Knee/Hip Exercises: Stretches   Gastroc Stretch  3 reps;30 seconds    Gastroc Stretch Limitations  slant board       Knee/Hip Exercises: Machines for Strengthening   Cybex Leg  Press  2 x 10; 30#    Other Museum/gallery exhibitions officer; 30# x10      Knee/Hip Exercises: Standing   Heel Raises  Both;20 reps    Hip Abduction  Right;20 reps    Hip Extension  Right;20 reps    Step Down  Right;2 sets;10 reps;Step Height: 2"    Step Down Limitations  lateral heel tap down    Functional Squat  2 sets;10 reps    Functional Squat Limitations  to chair, using red band at knees    SLS  3 x 20" on foam     Other Standing Knee Exercises  sidesepping with red band; 2 xRT      Manual Therapy   Manual Therapy  Soft tissue mobilization;Taping    Manual therapy comments  all manual interventions performed independently of other interventions    Soft tissue mobilization  Foam roll to RT ITB, distal quad    Kinesiotex  Facilitate Muscle   ktape sling for lateral patellar stability  PT Education - 03/11/19 1840    Education Details  Patient educated on POC, adding banded squats and sidestepping to HEP on non work days to avoid overworking leg/ knee muscles, and on taping technique for lateral patellar stability    Person(s) Educated  Patient    Methods  Explanation    Comprehension  Verbalized understanding       PT Short Term Goals - 02/15/19 1619      PT SHORT TERM GOAL #1   Title  Patient will be IND with initial HEP to improve functional outcomes    Time  2    Period  Weeks    Status  New    Target Date  03/01/19        PT Long Term Goals - 02/15/19 1620      PT LONG TERM GOAL #1   Title  Patient will improve FOTO score to <30% limited to indicate improved functional outcomes    Time  4    Period  Weeks    Status  New    Target Date  03/19/19      PT LONG TERM GOAL #2   Title  Patient will have 5/5 MMT throughout RLE to reduce RT knee pain and tolerance to functional activity at work    Time  4    Period  Weeks    Status  New    Target Date  03/19/19      PT LONG TERM GOAL #3   Title  Patient will be able to perform full shift at work  with pain 3/10 to improve tolerance to functional work tasks.    Time  4    Period  Weeks    Status  New    Target Date  03/19/19      PT LONG TERM GOAL #4   Title  Patient will be able to push/ pull up to 75lb cart with proper biomechanics to reduce knee pain and improve ability to perform work specific tasks.    Time  4    Period  Weeks    Status  New    Target Date  03/19/19            Plan - 03/11/19 1842    Clinical Impression Statement  Patient was sensitive to treatment initially, but improved during session. All activity modified to current patient tolerance. Patient educated on POC and time required for effective muscle strengthening to yield benefits of pain reduction for injuries such as hers. Patient educated on mechanics of valgus knee angle during squatting, steps, lunging and impact on knee pain. Patient educated on importance of strengthening hip and knee muscles to prevent this, and issued updated HEP and ther band. Trialed taping technique to lateral patellar stability as alternative to knee brace that patient does not regularly wear due to discomfort. Will assess response at next visit.    Examination-Activity Limitations  Stand;Locomotion Level;Squat;Stairs    Examination-Participation Restrictions  Yard Work;Community Activity;Cleaning    Stability/Clinical Decision Making  Stable/Uncomplicated    Rehab Potential  Good    PT Frequency  2x / week    PT Duration  4 weeks    PT Treatment/Interventions  ADLs/Self Care Home Management;Biofeedback;Cryotherapy;Electrical Stimulation;Therapeutic exercise;Therapeutic activities;Functional mobility training;Stair training;Gait training;Moist Heat;DME Instruction;Balance training;Neuromuscular re-education;Patient/family education;Orthotic Fit/Training;Compression bandaging;Manual techniques;Passive range of motion;Energy conservation;Splinting;Taping;Vasopneumatic Device;Joint Manipulations    PT Next Visit Plan  Continue to  progress hip and knee strength as tolerated. Assess response to taping technique  at next visit.    PT Home Exercise Plan  02/15/19: quad sets; 12/7 bridges, ASLR, SAQs, thomas stretch; 12/11 bridge add and walkout, prone hip ext and clams    Consulted and Agree with Plan of Care  Patient       Patient will benefit from skilled therapeutic intervention in order to improve the following deficits and impairments:  Pain, Decreased mobility, Decreased activity tolerance, Decreased strength, Hypomobility, Impaired flexibility, Increased edema, Difficulty walking  Visit Diagnosis: Acute pain of right knee  Other abnormalities of gait and mobility     Problem List Patient Active Problem List   Diagnosis Date Noted  . Plantar fasciitis 09/01/2017  . History of ovarian cyst 03/23/2015  . History of abnormal cervical Pap smear 03/23/2015  . Fibromyalgia 03/23/2015  . Hypertension, essential 10/05/2014  . Vitiligo 10/05/2014   6:52 PM, 03/11/19 Josue Hector PT DPT  Physical Therapist with [facility]  [phone] [place] [address] [place], [place], [address] Phone: [phone]   Fax:  [phone]  Name: Virginia Hawkins MRN: [id number] Date of Birth: 08/18/1970

## 2019-03-13 ENCOUNTER — Other Ambulatory Visit: Payer: 59

## 2019-03-13 ENCOUNTER — Other Ambulatory Visit: Payer: Self-pay

## 2019-03-13 ENCOUNTER — Ambulatory Visit (INDEPENDENT_AMBULATORY_CARE_PROVIDER_SITE_OTHER): Payer: 59 | Admitting: *Deleted

## 2019-03-13 DIAGNOSIS — M722 Plantar fascial fibromatosis: Secondary | ICD-10-CM

## 2019-03-13 NOTE — Progress Notes (Signed)
Patient presents for the 3rd EPAT  treatment today. She is status post EPF left (DOS 09/09/18). Most of her pain today is located medial arch proximal the the heel. There is a palpable lump in this spot that seems to be the most sensitive. She says the central arch pain is much better.  ESWT administered and tolerated well. Treatment settings initiated at:   Energy: 5 for 1000 shocks  Ended treatment session today with 3000 shocks at the following settings:   Energy: 7 for 1000 shocks    10 for 1000 shocks    Frequency: 6.0  Joules: 7.194  Arch massager was utilized x 3 rounds with focus to the lump in hopes to break up tissue.  Advised to avoid ice and NSAIDs and to utilize boot or supportive shoes for at least the next 3 days.  This was her final treatment today, recommended by Dr. Jacqualyn Posey. She will follow up for with him in 2 weeks.

## 2019-03-15 ENCOUNTER — Other Ambulatory Visit: Payer: Self-pay

## 2019-03-15 ENCOUNTER — Encounter (HOSPITAL_COMMUNITY): Payer: Self-pay | Admitting: Physical Therapy

## 2019-03-15 ENCOUNTER — Ambulatory Visit (HOSPITAL_COMMUNITY): Payer: PRIVATE HEALTH INSURANCE | Admitting: Physical Therapy

## 2019-03-15 DIAGNOSIS — M25561 Pain in right knee: Secondary | ICD-10-CM

## 2019-03-15 DIAGNOSIS — R2689 Other abnormalities of gait and mobility: Secondary | ICD-10-CM

## 2019-03-15 DIAGNOSIS — M25572 Pain in left ankle and joints of left foot: Secondary | ICD-10-CM

## 2019-03-15 NOTE — Therapy (Signed)
[place], [place], [address] Phone: [phone]   Fax:  [phone]  Physical Therapy Treatment  Patient Details  Name: Virginia Hawkins MRN: [id number] Date of Birth: Apr 08, 1970 Referring Provider (PT): Edmonia Lynch MD    Encounter Date: 03/15/2019  PT End of Session - 03/15/19 1539    Visit Number  5    Number of Visits  9    Date for PT Re-Evaluation  03/19/19    Authorization Type  [employer] [place] and 8 visits approved    Authorization Time Period  02/15/19-03/19/19    Authorization - Visit Number  5    Authorization - Number of Visits  9    PT Start Time  [id number]    PT Stop Time  1615    PT Time Calculation (min)  40 min    Equipment Utilized During Treatment  --   RT knee hinge brace   Activity Tolerance  Patient tolerated treatment well    Behavior During Therapy  [facility] for tasks assessed/performed       Past Medical History:  Diagnosis Date  . Fibromyalgia   . Ovarian cyst   . Vaginal Pap smear, abnormal     Past Surgical History:  Procedure Laterality Date  . CYST EXCISION    . SALPINGECTOMY      There were no vitals filed for this visit.  Subjective Assessment - 03/15/19 1538    Subjective  Patient says that taping knee helped with pain, and she is not having pain right now because she has not had to do much today.    Limitations  Lifting;Standing;Walking    Currently in Pain?  No/denies    Pain Onset  More than a month ago                       [facility] Adult PT Treatment/Exercise - 03/15/19 0001      Knee/Hip Exercises: Stretches   Sports administrator  3 reps;30 seconds    Quad Stretch Limitations  prone    Gastroc Stretch  3 reps;30 seconds    Gastroc Stretch Limitations  slant board       Knee/Hip Exercises: Standing   Heel Raises  Both;20 reps    Hip Abduction  Right;20 reps    Abduction Limitations  red band    Hip Extension  Right;20 reps    Extension Limitations  red band     Forward Step Up  Right;Hand Hold: 1;Step Height: 4";2 sets;10 reps    Step Down  Right;2 sets;10 reps;Step Height: 2"    Step Down Limitations  lateral heel tap down    Functional Squat  2 sets;10 reps    Functional Squat Limitations  to chair, using red band at knees    SLS with Vectors  3 x15"; 3 way; on foam      Manual Therapy   Manual Therapy  Soft tissue mobilization;Taping    Manual therapy comments  all manual interventions performed independently of other interventions    Soft tissue mobilization  roller to RT ITB, distal quad    Kinesiotex  Facilitate Muscle   K tape lateral patella sling with medial glide               PT Short Term Goals - 02/15/19 1619      PT SHORT TERM GOAL #1   Title  Patient will be IND with initial HEP  to improve functional outcomes    Time  2    Period  Weeks    Status  New    Target Date  03/01/19        PT Long Term Goals - 02/15/19 1620      PT LONG TERM GOAL #1   Title  Patient will improve FOTO score to <30% limited to indicate improved functional outcomes    Time  4    Period  Weeks    Status  New    Target Date  03/19/19      PT LONG TERM GOAL #2   Title  Patient will have 5/5 MMT throughout RLE to reduce RT knee pain and tolerance to functional activity at work    Time  4    Period  Weeks    Status  New    Target Date  03/19/19      PT LONG TERM GOAL #3   Title  Patient will be able to perform full shift at work with pain 3/10 to improve tolerance to functional work tasks.    Time  4    Period  Weeks    Status  New    Target Date  03/19/19      PT LONG TERM GOAL #4   Title  Patient will be able to push/ pull up to 75lb cart with proper biomechanics to reduce knee pain and improve ability to perform work specific tasks.    Time  4    Period  Weeks    Status  New    Target Date  03/19/19            Plan - 03/15/19 1618    Clinical Impression Statement  Patient  tolerated session well today with no increased complaint of pain. Patient did note increased pulling sensation in RT knee during squats and step ups. Patient able to progress weight with machine walkouts. Patient continued to demo slight knee valgus with squatting/ pressing activity, but is improved with tactile input from band with squatting, and verbal cueing with leg press. Applied K tape for lateral patellar sling, but added medial glide taping this visit for increased stability. Will assess response next visit.    Examination-Activity Limitations  Stand;Locomotion Level;Squat;Stairs    Examination-Participation Restrictions  Yard Work;Community Activity;Cleaning    Stability/Clinical Decision Making  Stable/Uncomplicated    Rehab Potential  Good    PT Frequency  2x / week    PT Duration  4 weeks    PT Treatment/Interventions  ADLs/Self Care Home Management;Biofeedback;Cryotherapy;Electrical Stimulation;Therapeutic exercise;Therapeutic activities;Functional mobility training;Stair training;Gait training;Moist Heat;DME Instruction;Balance training;Neuromuscular re-education;Patient/family education;Orthotic Fit/Training;Compression bandaging;Manual techniques;Passive range of motion;Energy conservation;Splinting;Taping;Vasopneumatic Device;Joint Manipulations    PT Next Visit Plan  Progress note next visit. Continue to progress hip and knee strength as tolerated. Assess response to taping technique at next visit. Add sled push/ pull    PT Home Exercise Plan  02/15/19: quad sets; 12/7 bridges, ASLR, SAQs, thomas stretch; 12/11 bridge add and walkout, prone hip ext and clams    Consulted and Agree with Plan of Care  Patient       Patient will benefit from skilled therapeutic intervention in order to improve the following deficits and impairments:  Pain, Decreased mobility, Decreased activity tolerance, Decreased strength, Hypomobility, Impaired flexibility, Increased edema, Difficulty walking  Visit  Diagnosis: Acute pain of right knee  Other abnormalities of gait and mobility  Pain in left ankle and joints of left foot  Problem List Patient Active Problem List   Diagnosis Date Noted  . Plantar fasciitis 09/01/2017  . History of ovarian cyst 03/23/2015  . History of abnormal cervical Pap smear 03/23/2015  . Fibromyalgia 03/23/2015  . Hypertension, essential 10/05/2014  . Vitiligo 10/05/2014   4:27 PM, 03/15/19 Josue Hector PT DPT  Physical Therapist with [facility]  [phone]   [facility] [address] [place], [place], [address] Phone: [phone]   Fax:  [phone]  Name: Virginia Hawkins MRN: [id number] Date of Birth: 1970-05-05

## 2019-03-17 ENCOUNTER — Ambulatory Visit (HOSPITAL_COMMUNITY): Payer: PRIVATE HEALTH INSURANCE | Admitting: Physical Therapy

## 2019-03-17 ENCOUNTER — Encounter (HOSPITAL_COMMUNITY): Payer: Self-pay | Admitting: Physical Therapy

## 2019-03-17 ENCOUNTER — Other Ambulatory Visit: Payer: Self-pay

## 2019-03-17 DIAGNOSIS — M25561 Pain in right knee: Secondary | ICD-10-CM | POA: Diagnosis not present

## 2019-03-17 DIAGNOSIS — M25572 Pain in left ankle and joints of left foot: Secondary | ICD-10-CM

## 2019-03-17 DIAGNOSIS — R2689 Other abnormalities of gait and mobility: Secondary | ICD-10-CM

## 2019-03-17 NOTE — Therapy (Signed)
[place] [address] [place], [place], [address] Phone: [phone]   Fax:  [phone]  Physical Therapy Treatment/ Progress Note  Patient Details  Name: Virginia Hawkins MRN: [id number] Date of Birth: 1970/04/05 Referring Provider (PT): Edmonia Lynch MD    Encounter Date: 03/17/2019   Progress Note Reporting Period 02/15/19 to 03/17/19  See note below for Objective Data and Assessment of Progress/Goals.       PT End of Session - 03/17/19 1738    Visit Number  6    Number of Visits  12    Date for PT Re-Evaluation  03/19/19   PN done 03/17/19   Authorization Type  [employer] [place] and 8 visits approved; requested 3 more visits on 03/17/19    Authorization Time Period  02/15/19-03/19/19; 03/17/19-04/09/19    Authorization - Visit Number  6    Authorization - Number of Visits  9    PT Start Time  [id number]    PT Stop Time  [id number]   Ended session early due to patient time constraint   PT Time Calculation (min)  23 min    Equipment Utilized During Treatment  --   RT knee hinge brace   Activity Tolerance  Patient tolerated treatment well    Behavior During Therapy  [facility] for tasks assessed/performed       Past Medical History:  Diagnosis Date  . Fibromyalgia   . Ovarian cyst   . Vaginal Pap smear, abnormal     Past Surgical History:  Procedure Laterality Date  . CYST EXCISION    . SALPINGECTOMY      There were no vitals filed for this visit.  Subjective Assessment - 03/17/19 1736    Subjective  Patient says knee has been feeling better and that taping has really helped. Patient reports pain at 5/10 and that she feels about 85% improved since starting therapy.    Limitations  Lifting;Standing;Walking    Currently in Pain?  Yes    Pain Score  5     Pain Location  Knee    Pain Orientation  Right    Pain Descriptors / Indicators  Aching    Pain Type  Acute pain    Pain Onset  More than a month ago    Pain Frequency  Constant         [facility] PT Assessment - 03/17/19 0001      Assessment   Medical Diagnosis  Rt knee pain    Referring Provider (PT)  Edmonia Lynch MD     Onset Date/Surgical Date  08/26/18    Prior Therapy  No      Precautions   Precautions  None      Restrictions   Weight Bearing Restrictions  No      Prior Function   Level of Independence  Independent      Cognition   Overall Cognitive Status  Within Functional Limits for tasks assessed      Observation/Other Assessments   Focus on Therapeutic Outcomes (FOTO)   35% limited   was 45%     Strength   Right Hip Flexion  4+/5    Right Hip Extension  4/5    Right Hip ABduction  4/5    Left Hip Flexion  5/5    Left Hip Extension  4+/5    Left Hip ABduction  4+/5    Right Knee Flexion  5/5    Right Knee Extension  5/5    Left Knee Flexion  5/5    Left Knee Extension  5/5    Right Ankle Dorsiflexion  5/5    Left Ankle Dorsiflexion  5/5                   [facility] Adult PT Treatment/Exercise - 03/17/19 0001      Knee/Hip Exercises: Standing   Other Standing Knee Exercises  Sled push pull 30/40/50# 1 rep each             PT Education - 03/17/19 1812    Education Details  Patient educated on reassessment findings, and POC    Person(s) Educated  Patient    Methods  Explanation    Comprehension  Verbalized understanding       PT Short Term Goals - 03/17/19 1741      PT SHORT TERM GOAL #1   Title  Patient will be IND with initial HEP to improve functional outcomes    Time  2    Period  Weeks    Status  Achieved    Target Date  03/01/19        PT Long Term Goals - 03/17/19 1756      PT LONG TERM GOAL #1   Title  Patient will improve FOTO score to <30% limited to indicate improved functional outcomes    Baseline  Current 35%    Time  4    Period  Weeks    Status  On-going      PT LONG TERM GOAL #2   Title  Patient will have 5/5 MMT throughout RLE to reduce RT knee pain and  tolerance to functional activity at work    Time  4    Period  Weeks    Status  On-going      PT LONG TERM GOAL #3   Title  Patient will be able to perform full shift at work with pain 3/10 to improve tolerance to functional work tasks.    Time  4    Period  Weeks    Status  On-going      PT LONG TERM GOAL #4   Title  Patient will be able to push/ pull up to 75lb cart with proper biomechanics to reduce knee pain and improve ability to perform work specific tasks.    Baseline  Current 50lb push/pull    Time  4    Period  Weeks    Status  On-going            Plan - 03/17/19 1801    Clinical Impression Statement  Patient has made slow steady progress to LTGs. Patient shows some improvement in RT knee MMT, but continues to have RT hip weakness, likely contributing to knee pain. Patient continues to exhibit abnormal mechanics with stepping and squatting activity resulting in valgus RT knee angle. Patient has noted improvement with taping technique for patellar stability, but continues to be limited in function by increased knee pain, especially with prolonged weight bearing activity. Patient will continue to benefit from skilled therapy services to address remaining deficits to reduce pain and return patient to PLOF with functional mobility and work tasks.    Examination-Activity Limitations  Stand;Locomotion Level;Squat;Stairs    Examination-Participation Restrictions  Yard Work;Community Activity;Cleaning    Stability/Clinical Decision Making  Stable/Uncomplicated    Rehab Potential  Good    PT Frequency  2x / week    PT Duration  3 weeks  PT Treatment/Interventions  ADLs/Self Care Home Management;Biofeedback;Cryotherapy;Electrical Stimulation;Therapeutic exercise;Therapeutic activities;Functional mobility training;Stair training;Gait training;Moist Heat;DME Instruction;Balance training;Neuromuscular re-education;Patient/family education;Orthotic Fit/Training;Compression  bandaging;Manual techniques;Passive range of motion;Energy conservation;Splinting;Taping;Vasopneumatic Device;Joint Manipulations    PT Next Visit Plan  Continue taping technique. Progress RLE strengthening and stabilization exercise as tolerated    PT Home Exercise Plan  02/15/19: quad sets; 12/7 bridges, ASLR, SAQs, thomas stretch; 12/11 bridge add and walkout, prone hip ext and clams    Consulted and Agree with Plan of Care  Patient       Patient will benefit from skilled therapeutic intervention in order to improve the following deficits and impairments:  Pain, Decreased mobility, Decreased activity tolerance, Decreased strength, Hypomobility, Impaired flexibility, Increased edema, Difficulty walking  Visit Diagnosis: Acute pain of right knee  Other abnormalities of gait and mobility  Pain in left ankle and joints of left foot     Problem List Patient Active Problem List   Diagnosis Date Noted  . Plantar fasciitis 09/01/2017  . History of ovarian cyst 03/23/2015  . History of abnormal cervical Pap smear 03/23/2015  . Fibromyalgia 03/23/2015  . Hypertension, essential 10/05/2014  . Vitiligo 10/05/2014   6:13 PM, 03/17/19 Josue Hector PT DPT  Physical Therapist with [facility]  [phone] [place] [address] [place], [place], [address] Phone: [phone]   Fax:  [phone]  Name: Virginia Hawkins MRN: [id number] Date of Birth: 30-Mar-1970

## 2019-03-22 ENCOUNTER — Other Ambulatory Visit: Payer: Self-pay

## 2019-03-22 ENCOUNTER — Ambulatory Visit (INDEPENDENT_AMBULATORY_CARE_PROVIDER_SITE_OTHER): Payer: 59 | Admitting: Podiatry

## 2019-03-22 DIAGNOSIS — M722 Plantar fascial fibromatosis: Secondary | ICD-10-CM | POA: Diagnosis not present

## 2019-03-25 NOTE — Progress Notes (Signed)
Subjective: Virginia Hawkins is a 48 y.o. is seen today in office for follow-up of left heel pain, plantar fasciitis.  She previously underwent endoscopic plantar fascial release on 01/09/2019.  Her main concern today is that she started to feel a "knot" in the arch of the foot and this is where the majority tenderness is localized.  She did do the EPAT treatment in the last treatment did help with this pain as the 'knot" started right before the third treatment.  She feels that she is getting better she is going to take time. Denies any systemic complaints such as fevers, chills, nausea, vomiting. No calf pain, chest pain, shortness of breath.   Objective: General: No acute distress, AAOx3  DP/PT pulses palpable 2/4, CRT < 3 sec to all digits.  Protective sensation intact. Motor function intact.  Left foot: Incisions are all well-healed with a scar.  There is mild discomfort on the plantar aspect of the heel and there appears to be a small fibroma present and/or scar tissue.  This is where the majority of tenderness is identified.  There is minimal edema in this area.  There is no erythema or warmth.  No other areas of tenderness identified.  Crepitus present. No pain with calf compression, swelling, warmth, erythema.   Assessment and Plan:  Chronic left foot pain, plantar fasciitis with likely fibroma  -Treatment options discussed including all alternatives, risks, and complications -Overall she feels that she is continuing to heal just, take time.  She is going to physical therapy currently for her knee. Will send an order to have the therapist work the foot if able. Would like for her to get astym if possible. She is in agreement to this  RTC after PT or sooner if needed  Trula Slade DPM

## 2019-03-29 ENCOUNTER — Telehealth (HOSPITAL_COMMUNITY): Payer: Self-pay | Admitting: Physical Therapy

## 2019-03-29 ENCOUNTER — Ambulatory Visit (HOSPITAL_COMMUNITY): Payer: PRIVATE HEALTH INSURANCE | Admitting: Physical Therapy

## 2019-03-29 NOTE — Telephone Encounter (Signed)
Patient can not get off work to come appt will be rescheduled

## 2019-03-31 ENCOUNTER — Telehealth (HOSPITAL_COMMUNITY): Payer: Self-pay | Admitting: Physical Therapy

## 2019-03-31 ENCOUNTER — Telehealth: Payer: Self-pay | Admitting: *Deleted

## 2019-03-31 ENCOUNTER — Encounter (HOSPITAL_COMMUNITY): Payer: 59 | Admitting: Physical Therapy

## 2019-03-31 DIAGNOSIS — M722 Plantar fascial fibromatosis: Secondary | ICD-10-CM

## 2019-03-31 NOTE — Telephone Encounter (Signed)
Sent last 3 rehab notes to [facility]

## 2019-03-31 NOTE — Telephone Encounter (Signed)
Dr. Jacqualyn Posey requested pt be referred to [facility], where she is established for PT for her knee. Faxed referral, demographics to [facility].

## 2019-04-05 ENCOUNTER — Encounter (HOSPITAL_COMMUNITY): Payer: Self-pay | Admitting: Physical Therapy

## 2019-04-05 ENCOUNTER — Ambulatory Visit (HOSPITAL_COMMUNITY): Payer: PRIVATE HEALTH INSURANCE | Attending: Orthopedic Surgery | Admitting: Physical Therapy

## 2019-04-05 ENCOUNTER — Other Ambulatory Visit: Payer: Self-pay

## 2019-04-05 DIAGNOSIS — R2689 Other abnormalities of gait and mobility: Secondary | ICD-10-CM | POA: Insufficient documentation

## 2019-04-05 DIAGNOSIS — M25572 Pain in left ankle and joints of left foot: Secondary | ICD-10-CM | POA: Diagnosis present

## 2019-04-05 DIAGNOSIS — M25561 Pain in right knee: Secondary | ICD-10-CM | POA: Diagnosis present

## 2019-04-05 NOTE — Therapy (Signed)
[place], [place], [address] Phone: [phone]   Fax:  [phone]  Physical Therapy Treatment  Patient Details  Name: Virginia Hawkins MRN: [id number] Date of Birth: 1970/05/05 Referring Provider (PT): Edmonia Lynch MD    Encounter Date: 04/05/2019  PT End of Session - 04/05/19 1539    Visit Number  7    Number of Visits  12    Date for PT Re-Evaluation  03/19/19   PN done 03/17/19   Authorization Type  [employer] [facility] and 8 visits approved; requested 3 more visits on 03/17/19    Authorization Time Period  02/15/19-03/19/19; 03/17/19-04/09/19    Authorization - Visit Number  7    Authorization - Number of Visits  9    PT Start Time  1520    PT Stop Time  1600    PT Time Calculation (min)  40 min    Equipment Utilized During Treatment  --   RT knee hinge brace   Activity Tolerance  Patient tolerated treatment well    Behavior During Therapy  [facility] for tasks assessed/performed       Past Medical History:  Diagnosis Date  . Fibromyalgia   . Ovarian cyst   . Vaginal Pap smear, abnormal     Past Surgical History:  Procedure Laterality Date  . CYST EXCISION    . SALPINGECTOMY      There were no vitals filed for this visit.  Subjective Assessment - 04/05/19 1535    Subjective  Patient says knee is doing a little better. Says she has tried taping knee herself at home, which has been helpful. Patient reports having recent visit with foot specialist who recommended therapy for her plantar fasciitis, which may be contributing to knee pain due to compensation with weight bearing. DPM has sent referral, will set up for evaluation as soon as able.    Limitations  Lifting;Standing;Walking    Currently in Pain?  Yes    Pain Score  4     Pain Location  Knee    Pain Orientation  Right    Pain Descriptors / Indicators  Aching    Pain Type  Acute pain    Pain Onset  More than a month ago    Pain  Frequency  Constant                       [facility] Adult PT Treatment/Exercise - 04/05/19 0001      Knee/Hip Exercises: Stretches   Gastroc Stretch  3 reps;30 seconds    Gastroc Stretch Limitations  slant board       Knee/Hip Exercises: Standing   Hip Abduction  Right;20 reps    Abduction Limitations  red band    Hip Extension  Right;20 reps    Extension Limitations  red band     Forward Step Up  Right;2 sets;10 reps;Hand Hold: 0;Step Height: 6"    Step Down  Right;2 sets;10 reps;Step Height: 2"    Step Down Limitations  lateral heel tap down    Functional Squat  2 sets;10 reps    Functional Squat Limitations  to chair, using red band at knees    SLS with Vectors  3 x15"; 3 way; on foam    Other Standing Knee Exercises  Sled push pull 50# 5RT in hall       Manual Therapy   Manual Therapy  Soft tissue mobilization;Taping  Manual therapy comments  all manual interventions performed independently of other interventions    Soft tissue mobilization  roller to RT ITB, distal quad    Kinesiotex  Facilitate Muscle   K tape patellar sling for lateral stability (pre tx)              PT Short Term Goals - 03/17/19 1741      PT SHORT TERM GOAL #1   Title  Patient will be IND with initial HEP to improve functional outcomes    Time  2    Period  Weeks    Status  Achieved    Target Date  03/01/19        PT Long Term Goals - 03/17/19 1756      PT LONG TERM GOAL #1   Title  Patient will improve FOTO score to <30% limited to indicate improved functional outcomes    Baseline  Current 35%    Time  4    Period  Weeks    Status  On-going      PT LONG TERM GOAL #2   Title  Patient will have 5/5 MMT throughout RLE to reduce RT knee pain and tolerance to functional activity at work    Time  4    Period  Weeks    Status  On-going      PT LONG TERM GOAL #3   Title  Patient will be able to perform full shift at work with pain 3/10 to improve tolerance to  functional work tasks.    Time  4    Period  Weeks    Status  On-going      PT LONG TERM GOAL #4   Title  Patient will be able to push/ pull up to 75lb cart with proper biomechanics to reduce knee pain and improve ability to perform work specific tasks.    Baseline  Current 50lb push/pull    Time  4    Period  Weeks    Status  On-going            Plan - 04/05/19 1754    Clinical Impression Statement  Performed taping for lateral patellar stability at beginning of session, which appeared to improve overall tolerance to activity. Patient was able to resume all previous ther ex with no increased complaint of pain, though did note increased muscle fatigue with added reps of sled push pull with 50#. Patient continues to demo valgus angling of both knees during squatting activity, but is able to correct once cued. Patient able to progress step ups to 6 in box with no increased complaint of knee pain. Will continue to progress functional activity as tolerated.    Examination-Activity Limitations  Stand;Locomotion Level;Squat;Stairs    Examination-Participation Restrictions  Yard Work;Community Activity;Cleaning    Stability/Clinical Decision Making  Stable/Uncomplicated    Rehab Potential  Good    PT Frequency  2x / week    PT Duration  3 weeks    PT Treatment/Interventions  ADLs/Self Care Home Management;Biofeedback;Cryotherapy;Electrical Stimulation;Therapeutic exercise;Therapeutic activities;Functional mobility training;Stair training;Gait training;Moist Heat;DME Instruction;Balance training;Neuromuscular re-education;Patient/family education;Orthotic Fit/Training;Compression bandaging;Manual techniques;Passive range of motion;Energy conservation;Splinting;Taping;Vasopneumatic Device;Joint Manipulations    PT Next Visit Plan  Continue taping technique. Progress RLE strengthening and stabilization exercise as tolerated. Add monster walks next visit. Reassess knee function at end of week and  talk with patient about transition/ eval for plantar fascia referral.    PT Home Exercise Plan  02/15/19: quad sets; 12/7 bridges, ASLR,  SAQs, thomas stretch; 12/11 bridge add and walkout, prone hip ext and clams    Consulted and Agree with Plan of Care  Patient       Patient will benefit from skilled therapeutic intervention in order to improve the following deficits and impairments:  Pain, Decreased mobility, Decreased activity tolerance, Decreased strength, Hypomobility, Impaired flexibility, Increased edema, Difficulty walking  Visit Diagnosis: Acute pain of right knee  Other abnormalities of gait and mobility  Pain in left ankle and joints of left foot     Problem List Patient Active Problem List   Diagnosis Date Noted  . Plantar fasciitis 09/01/2017  . History of ovarian cyst 03/23/2015  . History of abnormal cervical Pap smear 03/23/2015  . Fibromyalgia 03/23/2015  . Hypertension, essential 10/05/2014  . Vitiligo 10/05/2014   6:02 PM, 04/05/19 Josue Hector PT DPT  Physical Therapist with [facility]  [phone] [place] [address] [place], [place], [address] Phone: [phone]   Fax:  [phone]  Name: Virginia Hawkins MRN: [id number] Date of Birth: [birth year]

## 2019-04-12 ENCOUNTER — Other Ambulatory Visit: Payer: Self-pay

## 2019-04-12 ENCOUNTER — Ambulatory Visit (HOSPITAL_COMMUNITY): Payer: PRIVATE HEALTH INSURANCE | Admitting: Physical Therapy

## 2019-04-12 ENCOUNTER — Encounter (HOSPITAL_COMMUNITY): Payer: Self-pay | Admitting: Physical Therapy

## 2019-04-12 DIAGNOSIS — M25572 Pain in left ankle and joints of left foot: Secondary | ICD-10-CM

## 2019-04-12 DIAGNOSIS — M25561 Pain in right knee: Secondary | ICD-10-CM | POA: Diagnosis not present

## 2019-04-12 DIAGNOSIS — R2689 Other abnormalities of gait and mobility: Secondary | ICD-10-CM

## 2019-04-12 NOTE — Therapy (Signed)
[facility] [address] [place], [place], [address] Phone: [phone]   Fax:  [phone]  Physical Therapy Treatment/ Progress Note  Patient Details  Name: Virginia Hawkins MRN: [id number] Date of Birth: 1971/02/19 Referring Provider (PT): Edmonia Lynch MD    Encounter Date: 04/12/2019  Progress Note Reporting Period 03/17/19 to 04/12/19  See note below for Objective Data and Assessment of Progress/Goals.      PT End of Session - 04/12/19 1707    Visit Number  8    Number of Visits  12    Date for PT Re-Evaluation  05/14/19   PN done 04/12/19   Authorization Type  [employer] [place] and 8 visits approved (Request 8 more visits 04/12/19)    Authorization Time Period  02/15/19-03/19/19; 03/17/19-04/09/19; 04/12/19-05/14/19    Authorization - Visit Number  8    Authorization - Number of Visits  9    PT Start Time  4627    PT Stop Time  1705    PT Time Calculation (min)  50 min    Equipment Utilized During Treatment  --   RT knee hinge brace   Activity Tolerance  Patient tolerated treatment well    Behavior During Therapy  WFL for tasks assessed/performed       Past Medical History:  Diagnosis Date  . Fibromyalgia   . Ovarian cyst   . Vaginal Pap smear, abnormal     Past Surgical History:  Procedure Laterality Date  . CYST EXCISION    . SALPINGECTOMY      There were no vitals filed for this visit.  Subjective Assessment - 04/12/19 1622    Subjective  Patient says she is not having a good day today. Patient says her knee is hurting but that she has other aches and pains that are bothering her as well. Patient says she is not sure why her pain is elevated but says the weather may be contributing.    Limitations  Lifting;Standing;Walking    Currently in Pain?  Yes    Pain Score  9     Pain Location  Knee    Pain Orientation  Right;Lateral    Pain Descriptors / Indicators  Aching    Pain Type  Acute pain    Pain Onset  More than a month ago    Pain Frequency  Constant    Aggravating Factors   standign, walking, stairs    Pain Relieving Factors  rest, meds, taping    Effect of Pain on Daily Activities  Limited         [facility] PT Assessment - 04/12/19 0001      Assessment   Medical Diagnosis  Rt knee pain    Referring Provider (PT)  Edmonia Lynch MD     Onset Date/Surgical Date  08/26/18    Prior Therapy  No      Precautions   Precautions  None      Restrictions   Weight Bearing Restrictions  No      Prior Function   Level of Independence  Independent      Cognition   Overall Cognitive Status  Within Functional Limits for tasks assessed      Observation/Other Assessments   Focus on Therapeutic Outcomes (FOTO)   41% Limited   was 35%     AROM   Overall AROM Comments  --   Bilateral knee AROM WFL     Strength   Right  Hip Flexion  4+/5    Right Hip Extension  4+/5   was 4   Right Hip ABduction  4+/5   was 4   Left Hip Flexion  5/5    Left Hip Extension  4+/5    Left Hip ABduction  4+/5    Right Knee Flexion  5/5   discomfort at lateral patella   Right Knee Extension  5/5   mild pain at lateral patella   Left Knee Flexion  5/5    Left Knee Extension  5/5      Flexibility   Soft Tissue Assessment /Muscle Length  yes    Quadriceps  Min restriction in RT quad       Special Tests    Special Tests  --   Continued RT knee valgus with squatting, especially RT SLS    Other special tests  --   Also noted RT navicular arch drop with SLS squatting on RT                  [facility] Adult PT Treatment/Exercise - 04/12/19 0001      Knee/Hip Exercises: Standing   Hip Abduction  Both;15 reps    Abduction Limitations  green band    Hip Extension  Both;15 reps    Extension Limitations  green band    Step Down  Right;2 sets;10 reps;Step Height: 4"    Step Down Limitations  lateral heel tap down    SLS with Vectors  3 x15"; 3 way; on foam    Other Standing Knee  Exercises  Sled push pull 50/60/70# 1RT ea in hall       Manual Therapy   Manual Therapy  Taping    Manual therapy comments  all manual interventions performed independently of other interventions    Kinesiotex  Facilitate Muscle   K tape lateral patellar sling with medial glide for stabilty            PT Education - 04/12/19 1706    Education Details  Patient educated on reassessment findings and POC    Person(s) Educated  Patient    Methods  Explanation    Comprehension  Verbalized understanding       PT Short Term Goals - 03/17/19 1741      PT SHORT TERM GOAL #1   Title  Patient will be IND with initial HEP to improve functional outcomes    Time  2    Period  Weeks    Status  Achieved    Target Date  03/01/19        PT Long Term Goals - 04/12/19 1717      PT LONG TERM GOAL #1   Title  Patient will improve FOTO score to <30% limited to indicate improved functional outcomes    Baseline  Current 41%    Time  4    Period  Weeks    Status  On-going      PT LONG TERM GOAL #2   Title  Patient will have 5/5 MMT throughout RLE to reduce RT knee pain and tolerance to functional activity at work    Time  4    Period  Weeks    Status  Partially Met      PT LONG TERM GOAL #3   Title  Patient will be able to perform full shift at work with pain 3/10 to improve tolerance to functional work tasks.    Baseline  Current: 9/10  Time  4    Period  Weeks    Status  On-going      PT LONG TERM GOAL #4   Title  Patient will be able to push/ pull up to 75lb cart with proper biomechanics to reduce knee pain and improve ability to perform work specific tasks.    Baseline  Current 70lb push/pull    Time  4    Period  Weeks    Status  On-going            Plan - 04/12/19 1709    Clinical Impression Statement  Patient continues to make slow steady progress to LTGs. Patient has made improvements in strength and overall tolerance to progressive resistance activity but  continues to be limited mostly by pain and inefficient biomechanics. Patient has noted improvement in tolerance to activity and decreased pain with kenesiotaping technique to RT knee for improved patellar stability. Patient continues to have complaint of patellar instability, though has not had episode of subluxation since starting therapy. 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 Patient would continue to benefit from continued therapy services for 4 more weeks to continue to address remaining deficits, improve biomechanics to reduce pain and improve level of function with ADLs and functional mobility/ work tasks.    Examination-Activity Limitations  Stand;Locomotion Level;Squat;Stairs    Examination-Participation Restrictions  Yard Work;Community Activity;Cleaning    Stability/Clinical Decision Making  Stable/Uncomplicated    Rehab Potential  Good    PT Frequency  2x / week    PT Duration  4 weeks    PT Treatment/Interventions  ADLs/Self Care Home Management;Biofeedback;Cryotherapy;Electrical Stimulation;Therapeutic exercise;Therapeutic activities;Functional mobility training;Stair training;Gait training;Moist Heat;DME Instruction;Balance training;Neuromuscular re-education;Patient/family education;Orthotic Fit/Training;Compression bandaging;Manual techniques;Passive range of motion;Energy conservation;Splinting;Taping;Vasopneumatic Device;Joint Manipulations    PT Next Visit Plan  Progress RLE strengthening and stabilization exercise as tolerated. Add monster walks next visit.    PT Home Exercise Plan  02/15/19: quad sets; 12/7 bridges, ASLR, SAQs, thomas stretch; 12/11 bridge add and walkout, prone hip ext and clams    Consulted and Agree with Plan of Care  Patient       Patient will benefit from skilled therapeutic intervention in order to improve the following deficits and impairments:   Pain, Decreased mobility, Decreased activity tolerance, Decreased strength, Hypomobility, Impaired flexibility, Increased edema, Difficulty walking  Visit Diagnosis: Acute pain of right knee  Other abnormalities of gait and mobility  Pain in left ankle and joints of left foot     Problem List Patient Active Problem List   Diagnosis Date Noted  . Plantar fasciitis 09/01/2017  . History of ovarian cyst 03/23/2015  . History of abnormal cervical Pap smear 03/23/2015  . Fibromyalgia 03/23/2015  . Hypertension, essential 10/05/2014  . Vitiligo 10/05/2014   5:27 PM, 04/12/19 Josue Hector PT DPT  Physical Therapist with [facility]  [phone] [place] [address] [place], [place], [address] Phone: [phone]   Fax:  [phone]  Name: Virginia Hawkins MRN: [id number] Date of Birth: 1971/02/02

## 2019-04-15 ENCOUNTER — Telehealth (HOSPITAL_COMMUNITY): Payer: Self-pay | Admitting: Physical Therapy

## 2019-04-15 NOTE — Telephone Encounter (Signed)
[place] to requested more visits and get fax number no answer. Emailed adjustor: Peter Homes.([email or web address])

## 2019-04-19 ENCOUNTER — Telehealth (HOSPITAL_COMMUNITY): Payer: Self-pay | Admitting: Physical Therapy

## 2019-04-19 NOTE — Telephone Encounter (Signed)
Called pt l/m that worker's comp did not approved more visits. Request pt call us back to schedule eval for LT plantar fasitis

## 2019-04-19 NOTE — Telephone Encounter (Signed)
Called [facility] Worker's comp as you requested- got return phone call today from [place] with [facility] - patient has been released from Dr Fredonia [place] with no restricttions and worker's comp d/c patient on 03/29/2019. Katharine Look stated since pt cx 1/4 and 1/6 that the visits on 1/11 and 1/18 should be covered under worker's comp but no more visit will be issued. Patient can continue with PT using her private insurance if she wants too. Do you want me to call this patient with this information and if she wants to continue will we need a new referral from Dr. Latina Craver to continue care?

## 2019-04-23 DIAGNOSIS — J028 Acute pharyngitis due to other specified organisms: Secondary | ICD-10-CM | POA: Diagnosis not present

## 2019-04-23 DIAGNOSIS — B9789 Other viral agents as the cause of diseases classified elsewhere: Secondary | ICD-10-CM | POA: Diagnosis not present

## 2019-04-23 DIAGNOSIS — R0982 Postnasal drip: Secondary | ICD-10-CM | POA: Diagnosis not present

## 2019-04-23 MED FILL — FLUTICASONE PROP 50 MCG SPR: 50 | 30 days supply | Qty: 16 | Fill #0

## 2019-05-03 ENCOUNTER — Other Ambulatory Visit: Payer: Self-pay

## 2019-05-03 ENCOUNTER — Ambulatory Visit: Payer: 59 | Admitting: Podiatry

## 2019-05-03 ENCOUNTER — Encounter: Payer: Self-pay | Admitting: Podiatry

## 2019-05-03 DIAGNOSIS — M722 Plantar fascial fibromatosis: Secondary | ICD-10-CM

## 2019-05-03 NOTE — Progress Notes (Signed)
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  She states that she has good days and bad days.  Pain is not every day.  Overall she does feel is getting better.  Her main concern is still having the "knot" the bottom of the foot. Denies any systemic complaints such as fevers, chills, nausea, vomiting. No acute changes since last appointment, and no other complaints at this time.   Objective: AAO x3, NAD DP/PT pulses palpable bilaterally, CRT less than 3 seconds There is still a small plantar fibroma/scar tissue present along the plantar aspect of the foot on the plantar fascia just distal to the insertion.  This is a majority tenderness occurs she reports but no severe pain today.  There is no edema, erythema. No open lesions or pre-ulcerative lesions.  No pain with calf compression, swelling, warmth, erythema  Assessment: Chronic foot pain, plantar fasciitis/fibroma  Plan: -All treatment options discussed with the patient including all alternatives, risks, complications.  -She is having to hold off on physical therapy for other issues.  I ordered a compound cream today through [facility] to include verapamil.  Continue home stretching, icing as well as supportive shoes.  Offered steroid injection but she wanted to hold off. -Patient encouraged to call the office with any questions, concerns, change in symptoms.   Trula Slade DPM

## 2019-05-03 NOTE — Patient Instructions (Signed)
I have ordered a medication for you that will come from [facility] in [place]. They should be calling you to verify insurance and will mail the medication to you. If you live close by then you can go by their pharmacy to pick up the medication. Their phone number is [phone]. If you do not hear from them in the next few days, please give us a call at [phone].   

## 2019-05-28 DIAGNOSIS — Z01419 Encounter for gynecological examination (general) (routine) without abnormal findings: Secondary | ICD-10-CM | POA: Diagnosis not present

## 2019-05-28 DIAGNOSIS — Z1212 Encounter for screening for malignant neoplasm of rectum: Secondary | ICD-10-CM | POA: Diagnosis not present

## 2019-05-28 DIAGNOSIS — Z1151 Encounter for screening for human papillomavirus (HPV): Secondary | ICD-10-CM | POA: Diagnosis not present

## 2019-05-31 ENCOUNTER — Other Ambulatory Visit (HOSPITAL_COMMUNITY): Payer: Self-pay | Admitting: Gynecology

## 2019-06-01 MED FILL — VITAFOL ULTRA SOFTGEL: 29-0.6-0.4- | 90 days supply | Qty: 90 | Fill #0

## 2019-06-07 ENCOUNTER — Other Ambulatory Visit: Payer: Self-pay

## 2019-06-07 ENCOUNTER — Encounter: Payer: Self-pay | Admitting: Podiatry

## 2019-06-07 ENCOUNTER — Ambulatory Visit: Payer: 59 | Admitting: Podiatry

## 2019-06-07 DIAGNOSIS — M722 Plantar fascial fibromatosis: Secondary | ICD-10-CM

## 2019-06-09 NOTE — Progress Notes (Signed)
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  She states that they continue to massage the area but not and this is much improved.  No significant pain to this area.  General she is getting some pain in the morning when she first gets up.  No recent injury.  She was hoping for intermittent FMLA but unable to do so because of her work hours.  Denies any recent injury or trauma.  No swelling.  No radiating pain or weakness.  No acute changes since last appointment, and no other complaints at this time.   Objective: AAO x3, NAD DP/PT pulses palpable bilaterally, CRT less than 3 seconds Over the area of the plantar fibroma, scar tissue is no longer palpable and there is no severe pain to this area.  Not able to identify any significant area of tenderness but she is describing pain in the morning when she first gets up after being on her feet all day at work.  Flatfoot is present.  Negative Tinel sign. No open lesions or pre-ulcerative lesions.  No pain with calf compression, swelling, warmth, erythema  Assessment: Chronic foot pain, plantar fasciitis/fibroma  Plan: -All treatment options discussed with the patient including all alternatives, risks, complications.  -Overall she is having pain in the morning I recommended to get back to stretching, icing a regular basis which she has been doing but also the night splint.  Continue orthotics/supportive shoes.  Seems that majority of her issues are coming from being on her feet for too long at work.  Return if symptoms worsen or fail to improve.

## 2019-06-11 ENCOUNTER — Other Ambulatory Visit: Payer: Self-pay | Admitting: Podiatry

## 2019-06-11 MED FILL — IBUPROFEN 800 MG TABS: 800 | 10 days supply | Qty: 30 | Fill #0

## 2019-06-14 MED FILL — METOPROLOL SUCCINATE ER 50: 50 | 90 days supply | Qty: 90 | Fill #0

## 2019-09-06 ENCOUNTER — Other Ambulatory Visit: Payer: Self-pay

## 2019-09-06 ENCOUNTER — Ambulatory Visit (INDEPENDENT_AMBULATORY_CARE_PROVIDER_SITE_OTHER): Payer: 59

## 2019-09-06 ENCOUNTER — Encounter: Payer: Self-pay | Admitting: Podiatry

## 2019-09-06 ENCOUNTER — Ambulatory Visit: Payer: 59 | Admitting: Podiatry

## 2019-09-06 DIAGNOSIS — M779 Enthesopathy, unspecified: Secondary | ICD-10-CM | POA: Diagnosis not present

## 2019-09-06 DIAGNOSIS — M722 Plantar fascial fibromatosis: Secondary | ICD-10-CM | POA: Diagnosis not present

## 2019-09-06 DIAGNOSIS — M79671 Pain in right foot: Secondary | ICD-10-CM

## 2019-09-06 DIAGNOSIS — M7750 Other enthesopathy of unspecified foot: Secondary | ICD-10-CM | POA: Diagnosis not present

## 2019-09-06 MED ORDER — METHYLPREDNISOLONE 4 MG PO TBPK: ORAL_TABLET | ORAL | 0 refills | Status: DC

## 2019-09-06 MED FILL — METHYLPREDNISOLONE 4 MG TAB: 4 | 6 days supply | Qty: 21 | Fill #0

## 2019-09-07 NOTE — Progress Notes (Signed)
Subjective: 49 year old female presents the office with concerns of pain to her right foot.  She states she gets pain between her big toe and second toe.  She denies any recent injury or trauma she said no recent treatment.  Regular shoes.  She is going to getting married next Saturday needs to be able to walk.  Regards her heel pain she still has intermittent discomfort.  May begin somewhat better she reports. Denies any systemic complaints such as fevers, chills, nausea, vomiting. No acute changes since last appointment, and no other complaints at this time.   Objective: AAO x3, NAD DP/PT pulses palpable bilaterally, CRT less than 3 seconds Tenderness on the right first interspace.  There is no pain on the first or second metatarsals and the pain at the beginning to motion.  Minimal edema. There is no erythema or warmth.  Flatfoot is evident.  No significant discomfort otherwise today. No pain with calf compression, swelling, warmth, erythema  Assessment: Tendinitis/capsulitis right foot  Plan: -All treatment options discussed with the patient including all alternatives, risks, complications.  -X-rays obtained and reviewed.  No evidence of acute fracture or stress fracture identified today.  Flatfoot is present. -Discussed steroid injection but she wished to hold off.  Prescribed Medrol Dosepak.  Ice to the area as well.  Continue orthotics and supportive shoes.  Do not wear flat shoes that she is wearing today.  This is not her normal shoe she reports. -Patient encouraged to call the office with any questions, concerns, change in symptoms.   Trula Slade DPM

## 2019-09-15 ENCOUNTER — Other Ambulatory Visit: Payer: Self-pay | Admitting: Podiatry

## 2019-09-15 DIAGNOSIS — M7750 Other enthesopathy of unspecified foot: Secondary | ICD-10-CM

## 2019-09-15 DIAGNOSIS — M722 Plantar fascial fibromatosis: Secondary | ICD-10-CM

## 2019-09-21 MED FILL — FLUTICASONE PROP 50 MCG SPR: 50 | 30 days supply | Qty: 16 | Fill #1

## 2019-09-21 MED FILL — METOPROLOL SUCCINATE ER 50: 50 | 90 days supply | Qty: 90 | Fill #0

## 2019-09-24 ENCOUNTER — Telehealth: Payer: Self-pay | Admitting: Podiatry

## 2019-09-24 NOTE — Telephone Encounter (Signed)
That would be fine to do. You can do it for 3 months.

## 2019-09-24 NOTE — Telephone Encounter (Signed)
Dr. Jacqualyn Posey is patient suppose to continue with Intermittent FMLA, Please advise. And If so for how long?

## 2019-09-27 MED FILL — CYCLOBENZAPRINE HCL 10 MG T: 10 | 30 days supply | Qty: 90 | Fill #0

## 2019-10-01 ENCOUNTER — Other Ambulatory Visit: Payer: Self-pay

## 2019-10-02 DIAGNOSIS — Z20828 Contact with and (suspected) exposure to other viral communicable diseases: Secondary | ICD-10-CM | POA: Diagnosis not present

## 2019-10-05 DIAGNOSIS — R05 Cough: Secondary | ICD-10-CM | POA: Diagnosis not present

## 2019-10-05 MED FILL — HYDROCODONE-CHLORPHEN ER SU: 10-8 | 5 days supply | Qty: 50 | Fill #0

## 2019-10-06 ENCOUNTER — Emergency Department (HOSPITAL_COMMUNITY)
Admission: EM | Admit: 2019-10-06 | Discharge: 2019-10-06 | Disposition: A | Discharge status: Home / Self Care | Payer: 59 | Attending: Emergency Medicine | Admitting: Emergency Medicine

## 2019-10-06 ENCOUNTER — Emergency Department (HOSPITAL_COMMUNITY): Payer: 59

## 2019-10-06 ENCOUNTER — Other Ambulatory Visit: Payer: Self-pay

## 2019-10-06 ENCOUNTER — Encounter (HOSPITAL_COMMUNITY): Payer: Self-pay

## 2019-10-06 DIAGNOSIS — J209 Acute bronchitis, unspecified: Secondary | ICD-10-CM | POA: Diagnosis not present

## 2019-10-06 DIAGNOSIS — Z79899 Other long term (current) drug therapy: Secondary | ICD-10-CM | POA: Diagnosis not present

## 2019-10-06 DIAGNOSIS — I10 Essential (primary) hypertension: Secondary | ICD-10-CM | POA: Diagnosis not present

## 2019-10-06 DIAGNOSIS — R05 Cough: Secondary | ICD-10-CM | POA: Diagnosis not present

## 2019-10-06 MED ORDER — AEROCHAMBER PLUS FLO-VU MEDIUM MISC
1.0000 | Freq: Once | Status: AC
  Administered 2019-10-06: 1
  Filled 2019-10-06 (×2): qty 1

## 2019-10-06 MED ORDER — ALBUTEROL SULFATE HFA 108 (90 BASE) MCG/ACT IN AERS
2.0000 | INHALATION_SPRAY | Freq: Once | RESPIRATORY_TRACT | Status: AC
  Administered 2019-10-06: 2 via RESPIRATORY_TRACT
  Filled 2019-10-06: qty 6.7

## 2019-10-06 MED ORDER — BENZONATATE 100 MG PO CAPS: 200.0000 mg | ORAL_CAPSULE | Freq: Two times a day (BID) | ORAL | 0 refills | Status: DC | PRN

## 2019-10-06 MED ORDER — PREDNISONE 20 MG PO TABS: 40.0000 mg | ORAL_TABLET | Freq: Every day | ORAL | 0 refills | Status: DC

## 2019-10-06 MED FILL — predniSONE 20 MG TABS: 20 | 5 days supply | Qty: 10 | Fill #0

## 2019-10-06 MED FILL — BENZONATATE 100 MG CAPS: 100 | 5 days supply | Qty: 20 | Fill #0

## 2019-10-06 NOTE — Discharge Instructions (Signed)
Your coughing will likely get better over the next couple of weeks but it may take a significant amount of time to get back to normal.  In the meantime please take Tessalon every 8 hours as needed for coughing, you can take this safely with Tussionex, you may take albuterol inhaler 2 puffs every 4 hours as needed and you may take prednisone daily for 5 days.  I have sent these prescriptions to your pharmacy.  Your x-ray is normal, no signs of pneumonia  Seek a medical exam for severe or worsening symptoms.

## 2019-10-06 NOTE — ED Provider Notes (Signed)
Virginia Hawkins Provider Note   CSN: [id number] Arrival date & time: 10/06/19  1324     History Chief Complaint  Patient presents with  . Cough    Virginia Hawkins is a 49 y.o. female.  HPI   This patient is a 49 year old female, she has no significant chronic medical history especially related to her lungs or her heart.  She does have a history of hypertension.  She presents with a complaint of a cough which has been going on for approximately 1 week.  It started as a sore throat and nausea and progressed into more of a cough.  She did an ED visit yesterday and was prescribed Tussionex which she states is dried up the cough but she continues to cough and feels short of breath.  She has had 2 - Covid test in the last week, 1 through local pharmacy and went through health at work through her job.  She works as a Char[employer] fundraiser.  Sore throat better, fever better though she measured 99.5 today.  Past Medical History:  Diagnosis Date  . Fibromyalgia   . Ovarian cyst   . Vaginal Pap smear, abnormal     Patient Active Problem List   Diagnosis Date Noted  . Plantar fasciitis 09/01/2017  . History of ovarian cyst 03/23/2015  . History of abnormal cervical Pap smear 03/23/2015  . Fibromyalgia 03/23/2015  . Hypertension, essential 10/05/2014  . Vitiligo 10/05/2014    Past Surgical History:  Procedure Laterality Date  . CYST EXCISION    . SALPINGECTOMY       OB History    Gravida  3   Para  2   Term  2   Preterm      AB  1   Living  2     SAB      TAB      Ectopic  1   Multiple      Live Births  2           Family History  Problem Relation Age of Onset  . Breast cancer Maternal Aunt   . ALS Maternal Aunt   . Diabetes Other   . Hypertension Other   . Heart attack Father 103  . Colon cancer Neg Hx   . Ovarian cancer Neg Hx   . Uterine cancer Neg Hx   . Cervical cancer Neg Hx     Social History   Tobacco Use  . Smoking status:  Never Smoker  . Smokeless tobacco: Never Used  Substance Use Topics  . Alcohol use: No  . Drug use: No    Home Medications Prior to Admission medications   Medication Sig Start Date End Date Taking? Authorizing Provider  benzonatate (TESSALON) 100 MG capsule Take 2 capsules (200 mg total) by mouth 2 (two) times daily as needed for cough. 10/06/19   Noemi Chapel, MD  cyproheptadine (PERIACTIN) 4 MG tablet Take 1 tablet (4 mg total) by mouth at bedtime. 03/23/15   Caren Macadam, MD  diclofenac (VOLTAREN) 75 MG EC tablet Take 1 tablet (75 mg total) by mouth 2 (two) times daily. 07/02/16   Trula Slade, DPM  DULoxetine (CYMBALTA) 20 MG capsule Take 1 capsule (20 mg total) by mouth daily. 03/23/15   Caren Macadam, MD  gabapentin (NEURONTIN) 100 MG capsule Take 1 capsule (100 mg total) by mouth at bedtime. 10/13/18   Trula Slade, DPM  ibuprofen (ADVIL) 800 MG tablet TAKE 1 TABLET  BY MOUTH EVERY 8 HOURS AS NEEDED. 06/11/19   Trula Slade, DPM  levonorgestrel-ethinyl estradiol (AVIANE,ALESSE,LESSINA) 0.1-20 MG-MCG tablet Take 1 tablet by mouth daily.    [provider]  meloxicam (MOBIC) 15 MG tablet Take 1 tablet (15 mg total) by mouth daily. 09/11/17   Trula Slade, DPM  methocarbamol (ROBAXIN) 500 MG tablet Take 1 tablet (500 mg total) by mouth 2 (two) times daily as needed for muscle spasms. 06/21/16   [name], PA-C  methylPREDNISolone (MEDROL DOSEPAK) 4 MG TBPK tablet Take as directed 09/06/19   Trula Slade, DPM  metoprolol succinate (TOPROL-XL) 50 MG 24 hr tablet TAKE 1 TABLET BY MOUTH ONCE DAILY. TAKE WITH OR IMMEDIATELY FOLLOWING A MEAL 11/01/15   Caren Macadam, MD  naproxen (NAPROSYN) 250 MG tablet Take 1 tablet (250 mg total) by mouth 2 (two) times daily with a meal. 06/21/16   [name], PA-C  [facility]  Creams-#14 scar cream    [provider]  nortriptyline (PAMELOR) 50 MG capsule  Take by mouth. 09/18/17   [provider]  predniSONE (DELTASONE) 20 MG tablet Take 2 tablets (40 mg total) by mouth daily. 10/06/19   Noemi Chapel, MD  pregabalin (LYRICA) 75 MG capsule Take 1 capsule (75 mg total) by mouth 2 (two) times daily. 12/03/18   Trula Slade, DPM    Allergies    Erythromycin and Sulfur  Review of Systems   Review of Systems  All other systems reviewed and are negative.   Physical Exam Updated Vital Signs BP (!) 158/92 (BP Location: Right Arm)   Pulse 97   Temp 99.5 F (37.5 C) (Oral)   Resp 18   Ht 1.676 m (5\' 6" )   Wt 68 kg   LMP  (Approximate)   SpO2 99%   BMI 24.21 kg/m   Physical Exam Vitals and nursing note reviewed.  Constitutional:      General: She is not in acute distress.    Appearance: She is well-developed.  HENT:     Head: Normocephalic and atraumatic.     Mouth/Throat:     Pharynx: No oropharyngeal exudate.  Eyes:     General: No scleral icterus.       Right eye: No discharge.        Left eye: No discharge.     Conjunctiva/sclera: Conjunctivae normal.     Pupils: Pupils are equal, round, and reactive to light.  Neck:     Thyroid: No thyromegaly.     Vascular: No JVD.  Cardiovascular:     Rate and Rhythm: Normal rate and regular rhythm.     Heart sounds: Normal heart sounds. No murmur heard.  No friction rub. No gallop.   Pulmonary:     Effort: Pulmonary effort is normal. No respiratory distress.     Breath sounds: Normal breath sounds. No wheezing or rales.  Abdominal:     General: Bowel sounds are normal. There is no distension.     Palpations: Abdomen is soft. There is no mass.     Tenderness: There is no abdominal tenderness.  Musculoskeletal:        General: No tenderness. Normal range of motion.     Cervical back: Normal range of motion and neck supple.  Lymphadenopathy:     Cervical: No cervical adenopathy.  Skin:    General: Skin is warm and dry.     Findings: No erythema or rash.    Neurological:  Mental Status: She is alert.     Coordination: Coordination normal.  Psychiatric:        Behavior: Behavior normal.     ED Results / Procedures / Treatments   Labs (all labs ordered are listed, but only abnormal results are displayed) Labs Reviewed - No data to display  EKG None  Radiology DG Chest Portable 1 View  Result Date: 10/06/2019 CLINICAL DATA:  Cough EXAM: PORTABLE CHEST 1 VIEW COMPARISON:  None. FINDINGS: The heart size and mediastinal contours are within normal limits. Both lungs are clear. The visualized skeletal structures are unremarkable. IMPRESSION: No active disease. Electronically Signed   By: Monte Fantasia M.D.   On: 10/06/2019 10:12    Procedures Procedures (including critical care time)  Medications Ordered in ED Medications  albuterol (VENTOLIN HFA) 108 (90 Base) MCG/ACT inhaler 2 puff (has no administration in time range)  AeroChamber Plus Flo-Vu Medium MISC 1 each (has no administration in time range)    ED Course  I have reviewed the triage vital signs and the nursing notes.  Pertinent labs & imaging results that were available during my care of the patient were reviewed by me and considered in my medical decision making (see chart for details).    MDM Rules/Calculators/A&P                          This patient has fairly clear lung sounds, sats are 99%, pulse is less than 100 and she is afebrile with a normal blood pressure.  At this time she will get a chest x-ray to further evaluate for the source of cough, this may be a bacterial source though I suspect viral given the prodromal symptoms of nausea and sore throat which predated this cough.  The patient is agreeable, she will be given albuterol, will add antibiotics if there is an infiltrate on the x-ray  X-ray personally evaluated and interpreted by myself, no signs of infiltrate or pneumothorax, patient stable for discharge on the medications below  Virginia Hawkins was  evaluated in Emergency Department on 10/06/2019 for the symptoms described in the history of present illness. She was evaluated in the context of the global COVID-19 pandemic, which necessitated consideration that the patient might be at risk for infection with the SARS-CoV-2 virus that causes COVID-19. Institutional protocols and algorithms that pertain to the evaluation of patients at risk for COVID-19 are in a state of rapid change based on information released by regulatory bodies including the CDC and federal and state organizations. These policies and algorithms were followed during the patient's care in the ED.   Final Clinical Impression(s) / ED Diagnoses Final diagnoses:  Acute bronchitis, unspecified organism    Rx / DC Orders ED Discharge Orders         Ordered    predniSONE (DELTASONE) 20 MG tablet  Daily     Discontinue  Reprint     10/06/19 1045    benzonatate (TESSALON) 100 MG capsule  2 times daily PRN     Discontinue  Reprint     10/06/19 1045           Noemi Chapel, MD 10/06/19 1047

## 2019-10-06 NOTE — ED Triage Notes (Signed)
Pt reports cough x 7 days.  Rpeorts has had covid vaccine and has had 2 negative covid tests recently.  Had an E visit yesterday and started taking tussionex and mucinex DM.

## 2019-10-11 ENCOUNTER — Ambulatory Visit: Payer: 59 | Admitting: Podiatry

## 2019-10-13 MED FILL — VITAFOL ULTRA SOFTGEL: 29-0.6-0.4- | 90 days supply | Qty: 90 | Fill #1

## 2019-11-08 DIAGNOSIS — M25561 Pain in right knee: Secondary | ICD-10-CM | POA: Diagnosis not present

## 2019-11-12 DIAGNOSIS — M76822 Posterior tibial tendinitis, left leg: Secondary | ICD-10-CM | POA: Diagnosis not present

## 2019-11-17 ENCOUNTER — Other Ambulatory Visit: Payer: Self-pay | Admitting: Orthopaedic Surgery

## 2019-11-17 DIAGNOSIS — M25572 Pain in left ankle and joints of left foot: Secondary | ICD-10-CM

## 2019-12-10 MED FILL — METOPROLOL SUCCINATE ER 50: 50 | 90 days supply | Qty: 90 | Fill #0

## 2019-12-10 MED FILL — FLUOCINONIDE 0.05% CREAM: 0.05 | 30 days supply | Qty: 30 | Fill #0

## 2019-12-13 ENCOUNTER — Other Ambulatory Visit: Payer: Self-pay

## 2019-12-13 ENCOUNTER — Ambulatory Visit
Admission: RE | Admit: 2019-12-13 | Discharge: 2019-12-13 | Disposition: A | Discharge status: Home / Self Care | Payer: 59 | Source: Ambulatory Visit | Attending: Orthopaedic Surgery | Admitting: Orthopaedic Surgery

## 2019-12-13 DIAGNOSIS — M25572 Pain in left ankle and joints of left foot: Secondary | ICD-10-CM | POA: Diagnosis not present

## 2019-12-21 ENCOUNTER — Encounter (HOSPITAL_COMMUNITY): Payer: Self-pay | Admitting: Physical Therapy

## 2019-12-21 NOTE — Therapy (Signed)
[facility], [place], [address] Phone: [phone]   Fax:  [phone]  Patient Details  Name: Virginia Hawkins MRN: [id number] Date of Birth: 1970-06-26 Referring Provider:  Edmonia Lynch  Encounter Date: 12/21/2019  PHYSICAL THERAPY DISCHARGE SUMMARY  Visits from Start of Care: 8  Current functional level related to goals / functional outcomes: See last progress note dated 04/12/19   Remaining deficits: See last progress note dated 04/12/19   Education / Equipment: Patient had mode moderate progress toward therapy goals. Patient unable to return per issues with insurance coverage. DC per non return since last visit.   Plan: Patient agrees to discharge.  Patient goals were partially met. Patient is being discharged due to not returning since the last visit.  ?????        5:08 PM, 12/21/19 Josue Hector PT DPT  Physical Therapist with [facility]  [phone] [place] [address] [place], [place], [address] Phone: [phone]   Fax:  [phone]

## 2019-12-24 DIAGNOSIS — M76822 Posterior tibial tendinitis, left leg: Secondary | ICD-10-CM | POA: Diagnosis not present

## 2020-01-13 ENCOUNTER — Ambulatory Visit: Payer: 59 | Admitting: Podiatry

## 2020-01-31 ENCOUNTER — Ambulatory Visit: Payer: 59 | Admitting: Podiatry

## 2020-02-11 ENCOUNTER — Other Ambulatory Visit: Payer: Self-pay | Admitting: Family Medicine

## 2020-02-11 ENCOUNTER — Other Ambulatory Visit: Payer: Self-pay

## 2020-02-11 ENCOUNTER — Ambulatory Visit: Payer: Self-pay

## 2020-02-11 DIAGNOSIS — M25571 Pain in right ankle and joints of right foot: Secondary | ICD-10-CM

## 2020-02-14 ENCOUNTER — Other Ambulatory Visit: Payer: Self-pay

## 2020-02-14 ENCOUNTER — Encounter: Payer: Self-pay | Admitting: Podiatry

## 2020-02-14 ENCOUNTER — Ambulatory Visit: Payer: 59

## 2020-02-14 ENCOUNTER — Ambulatory Visit (INDEPENDENT_AMBULATORY_CARE_PROVIDER_SITE_OTHER): Payer: 59 | Admitting: Podiatry

## 2020-02-14 DIAGNOSIS — M79672 Pain in left foot: Secondary | ICD-10-CM

## 2020-02-14 DIAGNOSIS — G8929 Other chronic pain: Secondary | ICD-10-CM

## 2020-02-14 DIAGNOSIS — M779 Enthesopathy, unspecified: Secondary | ICD-10-CM

## 2020-02-14 DIAGNOSIS — M792 Neuralgia and neuritis, unspecified: Secondary | ICD-10-CM | POA: Diagnosis not present

## 2020-02-14 DIAGNOSIS — M25562 Pain in left knee: Secondary | ICD-10-CM | POA: Diagnosis not present

## 2020-02-21 ENCOUNTER — Telehealth: Payer: Self-pay | Admitting: Podiatry

## 2020-02-21 NOTE — Progress Notes (Signed)
Subjective: 49 year old female presents the office today for concerns of evaluation of left foot pain.  She said the last couple weeks she is having worsening pain to her feet and she feels that she is having a "nerve issue" on her left foot.  Since I last saw her she has also followed-up with orthopedics as well and she was told that there may be a nerve issue to the foot.  They apparently discussed the nerve conduction test but this was not completed.  She presents today for evaluation.  She denies any recent injury or falls or any weakness. Denies any systemic complaints such as fevers, chills, nausea, vomiting. No acute changes since last appointment, and no other complaints at this time.   Objective: AAO x3, NAD DP/PT pulses palpable bilaterally, CRT less than 3 seconds Left foot is present.  No area of pinpoint tenderness.  She describes a burning sensation in the arch of the left foot.  There is no edema, erythema.  Negative Tinel sign.  Sensation appears to be intact with Thorn[place] Mule monofilament. No pain with calf compression, swelling, warmth, erythema  Assessment: Chronic foot pain left side with new burning left foot  Plan: -All treatment options discussed with the patient including all alternatives, risks, complications.  -At this point she said numerous conservative treatments for plantar fasciitis, flatfoot.  She is not interested in any further surgical options.  She has had the symptoms of burning to her foot she is followed with orthopedics with this as well.  She was told there may be a nerve issue and was going to have a nerve conduction test but she went to follow-up and discuss.  She feels this is some nerve related issue.  We will refer her to neurology for evaluation any neurological issues. -I would continue with arch supports and supportive shoes.  Home stretching exercises as well. -Patient encouraged to call the office with any questions, concerns, change in symptoms.    Trula Slade DPM

## 2020-02-21 NOTE — Telephone Encounter (Signed)
Good Morning, Virginia Hawkins has intermittent FMLA paperwork. I just need to know her restrictions, how long will the intermittent be in effect. How many episodes per week or month.

## 2020-02-23 NOTE — Telephone Encounter (Signed)
Can we copy what she had before?

## 2020-03-07 DIAGNOSIS — M222X2 Patellofemoral disorders, left knee: Secondary | ICD-10-CM | POA: Diagnosis not present

## 2020-03-10 ENCOUNTER — Other Ambulatory Visit (HOSPITAL_COMMUNITY): Payer: Self-pay | Admitting: Physician Assistant

## 2020-03-10 DIAGNOSIS — Z0000 Encounter for general adult medical examination without abnormal findings: Secondary | ICD-10-CM | POA: Diagnosis not present

## 2020-03-10 DIAGNOSIS — Z131 Encounter for screening for diabetes mellitus: Secondary | ICD-10-CM | POA: Diagnosis not present

## 2020-03-10 DIAGNOSIS — R238 Other skin changes: Secondary | ICD-10-CM | POA: Diagnosis not present

## 2020-03-10 DIAGNOSIS — Z13220 Encounter for screening for lipoid disorders: Secondary | ICD-10-CM | POA: Diagnosis not present

## 2020-03-10 DIAGNOSIS — Z1231 Encounter for screening mammogram for malignant neoplasm of breast: Secondary | ICD-10-CM | POA: Diagnosis not present

## 2020-03-10 MED FILL — LOSARTAN POTASSIUM 25 MG TA: 25 | 90 days supply | Qty: 90 | Fill #0

## 2020-03-10 MED FILL — FLUOCINONIDE 0.05% CREAM: 0.05 | 15 days supply | Qty: 30 | Fill #0

## 2020-03-10 MED FILL — METOPROLOL SUCCINATE ER 50: 50 | 90 days supply | Qty: 90 | Fill #0

## 2020-03-31 ENCOUNTER — Ambulatory Visit: Payer: 59 | Admitting: Endocrinology

## 2020-04-14 ENCOUNTER — Ambulatory Visit
Admission: EM | Admit: 2020-04-14 | Discharge: 2020-04-14 | Disposition: A | Discharge status: Home / Self Care | Payer: 59 | Attending: Family Medicine | Admitting: Family Medicine

## 2020-04-14 ENCOUNTER — Encounter: Payer: Self-pay | Admitting: Emergency Medicine

## 2020-04-14 ENCOUNTER — Other Ambulatory Visit: Payer: Self-pay

## 2020-04-14 DIAGNOSIS — M546 Pain in thoracic spine: Secondary | ICD-10-CM

## 2020-04-14 DIAGNOSIS — T148XXA Other injury of unspecified body region, initial encounter: Secondary | ICD-10-CM

## 2020-04-14 MED ORDER — PREDNISONE 10 MG PO TABS: 20.0000 mg | ORAL_TABLET | Freq: Every day | ORAL | 0 refills | Status: AC

## 2020-04-14 MED ORDER — CYCLOBENZAPRINE HCL 10 MG PO TABS: 10.0000 mg | ORAL_TABLET | Freq: Three times a day (TID) | ORAL | 0 refills | Status: DC | PRN
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
## 2020-04-14 NOTE — Discharge Instructions (Signed)
I have sent in flexeril for you to take twice a day as needed for muscle spasms. This medication can make you sleepy. Do not drive or operate heavy machinery with this medication.  I have sent in a prednisone taper for you to take for 6 days. 6 tablets on day one, 5 tablets on day two, 4 tablets on day three, 3 tablets on day four, 2 tablets on day five, and 1 tablet on day six.  Follow up with this office or with primary care if symptoms are persisting.  Follow up in the ER for high fever, trouble swallowing, trouble breathing, other concerning symptoms.
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
## 2020-04-24 MED FILL — VITAFOL ULTRA SOFTGEL: 29-0.6-0.4- | 90 days supply | Qty: 90 | Fill #2

## 2020-04-27 ENCOUNTER — Other Ambulatory Visit: Payer: Self-pay

## 2020-05-01 ENCOUNTER — Other Ambulatory Visit: Payer: Self-pay

## 2020-05-01 ENCOUNTER — Encounter: Payer: Self-pay | Admitting: Endocrinology

## 2020-05-01 ENCOUNTER — Ambulatory Visit (INDEPENDENT_AMBULATORY_CARE_PROVIDER_SITE_OTHER): Payer: 59 | Admitting: Endocrinology

## 2020-05-01 DIAGNOSIS — N912 Amenorrhea, unspecified: Secondary | ICD-10-CM | POA: Diagnosis not present

## 2020-05-01 LAB — FOLLICLE STIMULATING HORMONE: FSH: 147.3 m[IU]/mL

## 2020-05-01 LAB — LUTEINIZING HORMONE: LH: 69.54 m[IU]/mL

## 2020-05-01 NOTE — Patient Instructions (Signed)
Blood tests are requested for you today.  We'll let you know about the results.  I would be happy to prescribe a medication if you want.

## 2020-05-01 NOTE — Progress Notes (Signed)
Subjective:    Patient ID: Virginia Hawkins, female    DOB: 14-Apr-1970, 50 y.o.   MRN: [id number]  HPI Pt is referred by Dr Sherre Lain, for hyperprolactinemia.  Pt says bilat galactorrhea has been persistent since last childbirth 23 years ago.  She has no menses x 1 year.   She has never taken oral contraceptives. She was noted to have an elevated prolactin in 2021.  She denies the following: excessive exercise, opiates, antipsychotics, brain XRT, brain surgery, cirrhosis,  thyroid dz, seizures, PCO, hirsutism, renal dz, zoster, brain injury, or chest wall injury.  She has never had pituitary imaging.  She has had TL.   Past Medical History:  Diagnosis Date  . Fibromyalgia   . Ovarian cyst   . Vaginal Pap smear, abnormal     Past Surgical History:  Procedure Laterality Date  . CYST EXCISION    . SALPINGECTOMY      Social History   Socioeconomic History  . Marital status: Married    Spouse name: Not on file  . Number of children: Not on file  . Years of education: Not on file  . Highest education level: Not on file  Occupational History  . Not on file  Tobacco Use  . Smoking status: Never Smoker  . Smokeless tobacco: Never Used  Substance and Sexual Activity  . Alcohol use: No  . Drug use: No  . Sexual activity: Yes    Birth control/protection: Pill  Other Topics Concern  . Not on file  Social History Narrative  . Not on file   Social Determinants of Health   Financial Resource Strain: Not on file  Food Insecurity: Not on file  Transportation Needs: Not on file  Physical Activity: Not on file  Stress: Not on file  Social Connections: Not on file  Intimate Partner Violence: Not on file    Current Outpatient Medications on File Prior to Visit  Medication Sig Dispense Refill  . cyclobenzaprine (FLEXERIL) 10 MG tablet Take 1 tablet (10 mg total) by mouth 3 (three) times daily as needed for muscle spasms. 30 tablet 0  . cyproheptadine (PERIACTIN) 4 MG tablet Take 1  tablet (4 mg total) by mouth at bedtime. 90 tablet 0  . gabapentin (NEURONTIN) 100 MG capsule Take 1 capsule (100 mg total) by mouth at bedtime. 90 capsule 0  . metoprolol succinate (TOPROL-XL) 50 MG 24 hr tablet TAKE 1 TABLET BY MOUTH ONCE DAILY. TAKE WITH OR IMMEDIATELY FOLLOWING A MEAL 90 tablet 1  . naproxen (NAPROSYN) 250 MG tablet Take 1 tablet (250 mg total) by mouth 2 (two) times daily with a meal. 30 tablet 0  . NON [facility]  Creams-#14 scar cream    . nortriptyline (PAMELOR) 50 MG capsule Take by mouth.     No current facility-administered medications on file prior to visit.    Allergies  Allergen Reactions  . Elemental Sulfur Rash  . Erythromycin Rash    Family History  Problem Relation Age of Onset  . Breast cancer Maternal Aunt   . ALS Maternal Aunt   . Diabetes Other   . Hypertension Other   . Heart attack Father 28  . Colon cancer Neg Hx   . Ovarian cancer Neg Hx   . Uterine cancer Neg Hx   . Cervical cancer Neg Hx   . Other Neg Hx        pituitary disorder    BP 140/84 (BP Location: Right Arm, Patient Position:  Sitting, Cuff Size: Normal)   Pulse 80   Ht 5\' 6"  (1.676 m)   Wt 160 lb 9.6 oz (72.8 kg)   SpO2 97%   BMI 25.92 kg/m    Review of Systems denies polyuria, loss of smell, headache, visual loss, weight change, and n/v.  She has 20 lbs weight gain x 1 year,     Objective:   Physical Exam  VS: see vs page GEN: no distress HEAD: head: no deformity eyes: no periorbital swelling, no proptosis external nose and ears are normal NECK: supple, thyroid is not enlarged CHEST WALL: no deformity LUNGS: clear to auscultation CV: reg rate and rhythm, no murmur.  MUSCULOSKELETAL: gait is normal and steady EXTEMITIES: no deformity.  no leg edema NEURO:  readily moves all 4's.  sensation is intact to touch on all 4's SKIN:  Normal texture and temperature.  No rash or suspicious lesion is visible.   NODES:  None palpable at the  neck PSYCH: alert, well-oriented.  Does not appear anxious nor depressed.  outside test results are reviewed: TSH=normal Prolactin=29  I have reviewed outside records, and summarized: Pt was noted to have elevated prolactin, and referred here.  Insomnia and fatigue were also addressed.    FSH/LH are menopausal    Assessment & Plan:  Amenorrhea, due to menopause, rather than prolactin. Hyperprolactinemia, uncertain etiology.  Pt requests to check labs prior to taking rx.   Galactorrhea, prob due to the above.   Patient Instructions  Blood tests are requested for you today.  We'll let you know about the results.  I would be happy to prescribe a medication if you want.

## 2020-05-02 LAB — PROLACTIN: Prolactin: 10.2 ng/mL

## 2020-05-08 ENCOUNTER — Encounter: Payer: Self-pay | Admitting: Diagnostic Neuroimaging

## 2020-05-08 ENCOUNTER — Ambulatory Visit: Payer: 59 | Admitting: Endocrinology

## 2020-05-08 ENCOUNTER — Other Ambulatory Visit: Payer: Self-pay

## 2020-05-08 ENCOUNTER — Ambulatory Visit: Payer: 59 | Admitting: Diagnostic Neuroimaging

## 2020-05-08 VITALS — BP 145/94 | HR 76 | Ht 66.0 in | Wt 161.2 lb

## 2020-05-08 DIAGNOSIS — M722 Plantar fascial fibromatosis: Secondary | ICD-10-CM

## 2020-05-08 DIAGNOSIS — M797 Fibromyalgia: Secondary | ICD-10-CM

## 2020-05-08 NOTE — Progress Notes (Signed)
[facility]  PATIENT: Virginia Hawkins DOB: 1970-04-27  REFERRING CLINICIAN: Noralee Stain* and Percell Boston, DPM HISTORY FROM: patient  REASON FOR VISIT: new consult    HISTORICAL  CHIEF COMPLAINT:  Chief Complaint  Patient presents with  . Neuritis    Rm 7 New Pt  "plantar fasciitis with surgery in 2020, still have issues on bottom of left foot; gabapentin and Flexeril not helpful"    HISTORY OF PRESENT ILLNESS:   50 year old female with history of fibromyalgia, here for evaluation of left foot pain.  Patient reports bilateral foot pain on the bottom of her feet, with tenderness to pressure and walking, since 2016.  Symptoms started on the right side and then became bilateral.  Nowadays symptoms are worse on the left side.  She was diagnosed with plantar fasciitis and tendinitis, treated conservatively with physical therapy, injections, medications without relief.  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  Symptoms are worse when she is at the end of her work week, slightly improved by Sunday evening with some rest.  She has felt stabbing and burning pain, muscle spasms in the left foot.  She underwent left plantar fascial release surgery in 2020, unfortunately without significant relief.  She had EMG nerve conduction study, MRI of the foot in 2018 which were unremarkable.  She had MRI of the left ankle in 2021 which is unremarkable.    REVIEW OF SYSTEMS: Full 14 system review of systems performed and negative with exception of: As per HPI.  ALLERGIES: Allergies  Allergen Reactions  . Elemental Sulfur Rash  . Erythromycin Rash    HOME MEDICATIONS: Outpatient Medications Prior to Visit  Medication Sig Dispense Refill  . cyclobenzaprine (FLEXERIL) 10 MG tablet Take 1 tablet (10 mg total) by mouth 3 (three) times daily as needed for muscle spasms. 30 tablet 0  . cyproheptadine (PERIACTIN) 4 MG tablet Take 1  tablet (4 mg total) by mouth at bedtime. 90 tablet 0  . gabapentin (NEURONTIN) 100 MG capsule Take 1 capsule (100 mg total) by mouth at bedtime. 90 capsule 0  . metoprolol succinate (TOPROL-XL) 50 MG 24 hr tablet TAKE 1 TABLET BY MOUTH ONCE DAILY. TAKE WITH OR IMMEDIATELY FOLLOWING A MEAL 90 tablet 1  . naproxen (NAPROSYN) 250 MG tablet Take 1 tablet (250 mg total) by mouth 2 (two) times daily with a meal. 30 tablet 0  . NON [facility]  Creams-#14 scar cream    . nortriptyline (PAMELOR) 50 MG capsule Take by mouth.     No facility-administered medications prior to visit.    PAST MEDICAL HISTORY: Past Medical History:  Diagnosis Date  . Fibromyalgia   . Hypertension   . Ovarian cyst   . Vaginal Pap smear, abnormal     PAST SURGICAL HISTORY: Past Surgical History:  Procedure Laterality Date  . CYST EXCISION    . FOOT SURGERY Left 2020   for plantar fasciitis  . SALPINGECTOMY      FAMILY HISTORY: Family History  Problem Relation Age of Onset  . Breast cancer Maternal Aunt   . ALS Maternal Aunt   . Diabetes Other   . Hypertension Other   . Heart attack Father 57  . Colon cancer Neg Hx   . Ovarian cancer Neg Hx   . Uterine cancer Neg Hx   . Cervical cancer Neg Hx   . Other Neg Hx  pituitary disorder    SOCIAL HISTORY: Social History   Socioeconomic History  . Marital status: Married    Spouse name: Not on file  . Number of children: 2  . Years of education: Not on file  . Highest education level: Some college, no degree  Occupational History    Comment: Lab  Tobacco Use  . Smoking status: Never Smoker  . Smokeless tobacco: Never Used  Substance and Sexual Activity  . Alcohol use: No  . Drug use: No  . Sexual activity: Yes    Birth control/protection: Pill  Other Topics Concern  . Not on file  Social History Narrative   Lives with family   Caffeine- not much   Social Determinants of Health   Financial Resource Strain: Not on  file  Food Insecurity: Not on file  Transportation Needs: Not on file  Physical Activity: Not on file  Stress: Not on file  Social Connections: Not on file  Intimate Partner Violence: Not on file     PHYSICAL EXAM  GENERAL EXAM/CONSTITUTIONAL: Vitals:  Vitals:   05/08/20 0823  BP: (!) 145/94  Pulse: 76  Weight: 161 lb 3.2 oz (73.1 kg)  Height: 5\' 6"  (1.676 m)   Body mass index is 26.02 kg/m. Wt Readings from Last 3 Encounters:  05/08/20 161 lb 3.2 oz (73.1 kg)  05/01/20 160 lb 9.6 oz (72.8 kg)  04/14/20 155 lb (70.3 kg)    Patient is in no distress; well developed, nourished and groomed; neck is supple  CARDIOVASCULAR:  Examination of carotid arteries is normal; no carotid bruits  Regular rate and rhythm, no murmurs  Examination of peripheral vascular system by observation and palpation is normal  EYES:  Ophthalmoscopic exam of optic discs and posterior segments is normal; no papilledema or hemorrhages No exam data present  MUSCULOSKELETAL:  Gait, strength, tone, movements noted in Neurologic exam below  NEUROLOGIC: MENTAL STATUS:  No flowsheet data found.  awake, alert, oriented to person, place and time  recent and remote memory intact  normal attention and concentration  language fluent, comprehension intact, naming intact  fund of knowledge appropriate  CRANIAL NERVE:   2nd - no papilledema on fundoscopic exam  2nd, 3rd, 4th, 6th - pupils equal and reactive to light, visual fields full to confrontation, extraocular muscles intact, no nystagmus  5th - facial sensation symmetric  7th - facial strength symmetric  8th - hearing intact  9th - palate elevates symmetrically, uvula midline  11th - shoulder shrug symmetric  12th - tongue protrusion midline  MOTOR:   normal bulk and tone, full strength in the BUE, BLE  SENSORY:   normal and symmetric to light touch, temperature, vibration  COORDINATION:   finger-nose-finger, fine  finger movements normal  REFLEXES:   deep tendon reflexes TRACE and symmetric  GAIT/STATION:   narrow based gait     DIAGNOSTIC DATA (LABS, IMAGING, TESTING) - I reviewed patient records, labs, notes, testing and imaging myself where available.  Lab Results  Component Value Date   WBC 4.2 03/28/2015   HGB 12.7 03/28/2015   HCT 39.6 03/28/2015   MCV 85 03/28/2015   PLT 174 03/28/2015   No results found for: NA, K, CL, CO2, GLUCOSE, BUN, CREATININE, CALCIUM, PROT, ALBUMIN, AST, ALT, ALKPHOS, BILITOT, GFRNONAA, GFRAA No results found for: CHOL, HDL, LDLCALC, LDLDIRECT, TRIG, CHOLHDL No results found for: HGBA1C No results found for: [id number] Lab Results  Component Value Date   TSH 1.830 06/12/2015  07/30/16 EMG/NCS - This is a normal study. In particular, there is no evidence of a generalized sensorimotor polyneuropathy or lumbosacral radiculopathy affecting the lower extremities.  10/15/16 MRI left foot 1. Intact plantar fascia without abnormal thickening or nodularity. No subcortical reactive marrow edema at the plantar fascia insertion. 2. Mild osteoarthritis of the first MTP joint.  12/13/19 MRI left ankle 1. Normal MRI of the left ankle.     ASSESSMENT AND PLAN  50 y.o. year old female here with:  Dx:  1. Plantar fasciitis   2. Fibromyalgia      PLAN:  LEFT > RIGHT PLANTAR FASCIITIS; TENDONITIS (since 2016) + fibromyalgia - difficult case; intractable pain; failed PT, injections, electrical treatments, surgery - no evidence of separate neurologic issue; normal EMG in 2018 - continue treatments per podiatry and PCP  Return for return to PCP.    Penni Bombard, MD 12/08/3844, 6:59 AM Certified in Neurology, Neurophysiology and Neuroimaging  [facility] [address], [place], [place] [address] [phone]

## 2020-05-29 ENCOUNTER — Other Ambulatory Visit (HOSPITAL_COMMUNITY): Payer: Self-pay | Admitting: Gynecology

## 2020-05-29 DIAGNOSIS — Z1231 Encounter for screening mammogram for malignant neoplasm of breast: Secondary | ICD-10-CM | POA: Diagnosis not present

## 2020-05-29 DIAGNOSIS — L0292 Furuncle, unspecified: Secondary | ICD-10-CM | POA: Diagnosis not present

## 2020-05-29 DIAGNOSIS — Z1212 Encounter for screening for malignant neoplasm of rectum: Secondary | ICD-10-CM | POA: Diagnosis not present

## 2020-05-29 DIAGNOSIS — Z01419 Encounter for gynecological examination (general) (routine) without abnormal findings: Secondary | ICD-10-CM | POA: Diagnosis not present

## 2020-05-29 DIAGNOSIS — G4700 Insomnia, unspecified: Secondary | ICD-10-CM | POA: Diagnosis not present

## 2020-05-29 MED FILL — MEDROXYPROGESTERONE 10 MG T: 10 | 12 days supply | Qty: 12 | Fill #0

## 2020-05-29 MED FILL — NITROFURANTOIN MCR 50 MG CA: 50 | 90 days supply | Qty: 90 | Fill #0

## 2020-05-31 MED FILL — ZOLPIDEM TARTRATE 10 MG TAB: 10 | 30 days supply | Qty: 30 | Fill #0

## 2020-06-13 MED FILL — METOPROLOL SUCCINATE ER 50: 50 | 90 days supply | Qty: 90 | Fill #1

## 2020-07-17 ENCOUNTER — Encounter: Payer: Self-pay | Admitting: Podiatry

## 2020-07-17 ENCOUNTER — Other Ambulatory Visit (HOSPITAL_COMMUNITY): Payer: Self-pay

## 2020-07-17 ENCOUNTER — Other Ambulatory Visit: Payer: Self-pay

## 2020-07-17 ENCOUNTER — Ambulatory Visit: Payer: 59 | Admitting: Podiatry

## 2020-07-17 DIAGNOSIS — M79672 Pain in left foot: Secondary | ICD-10-CM

## 2020-07-17 DIAGNOSIS — G8929 Other chronic pain: Secondary | ICD-10-CM | POA: Diagnosis not present

## 2020-07-17 DIAGNOSIS — M779 Enthesopathy, unspecified: Secondary | ICD-10-CM

## 2020-07-17 MED ORDER — MELOXICAM 15 MG PO TABS
15.0000 mg | ORAL_TABLET | Freq: Every day | ORAL | 0 refills | Status: DC | PRN
  Filled 2020-07-17: qty 30, 30d supply, fill #0

## 2020-07-21 ENCOUNTER — Other Ambulatory Visit (HOSPITAL_COMMUNITY): Payer: Self-pay

## 2020-07-21 NOTE — Progress Notes (Signed)
Subjective: 50 year old female presents the office today for concerns of evaluation of left foot pain.  States that overall she is doing the same and nothing is changed.  She did follow-up with neurology.   Her foot pain is intermittent.  No new concerns.    Objective: AAO x3, NAD DP/PT pulses palpable bilaterally, CRT less than 3 seconds Left foot is present.  No area of pinpoint tenderness.  Unable to elicit any tenderness to palpation today and there is no edema, erythema.  Negative Tinel sign.  Symptoms seem to be worse with walking or standing for prolonged period of time mostly at work. No pain with calf compression, swelling, warmth, erythema  Assessment: Chronic foot pain left foot  Plan: -All treatment options discussed with the patient including all alternatives, risks, complications.  -She is continuing numerous conservative treatments without significant resolution she is not interested in any further surgical intervention.  For now we will continue with intermittent FMLA as needed as I do think a lot of her symptoms are coming from her job and standing on the hard surfaces.  She is also followed up previously with orthopedics as well as neurology.  Trula Slade DPM

## 2020-07-25 ENCOUNTER — Other Ambulatory Visit (HOSPITAL_COMMUNITY): Payer: Self-pay

## 2020-07-25 DIAGNOSIS — M25562 Pain in left knee: Secondary | ICD-10-CM | POA: Diagnosis not present

## 2020-07-25 MED ORDER — TRAMADOL HCL 50 MG PO TABS
50.0000 mg | ORAL_TABLET | Freq: Three times a day (TID) | ORAL | 0 refills | Status: DC | PRN
  Filled 2020-07-25: qty 30, 10d supply, fill #0

## 2020-07-26 ENCOUNTER — Other Ambulatory Visit (HOSPITAL_COMMUNITY): Payer: Self-pay

## 2020-07-28 ENCOUNTER — Other Ambulatory Visit (HOSPITAL_COMMUNITY): Payer: Self-pay

## 2020-07-28 DIAGNOSIS — M25562 Pain in left knee: Secondary | ICD-10-CM | POA: Diagnosis not present

## 2020-08-01 ENCOUNTER — Other Ambulatory Visit (HOSPITAL_COMMUNITY): Payer: Self-pay

## 2020-08-01 DIAGNOSIS — M25562 Pain in left knee: Secondary | ICD-10-CM | POA: Diagnosis not present

## 2020-08-03 ENCOUNTER — Other Ambulatory Visit (HOSPITAL_COMMUNITY): Payer: Self-pay

## 2020-08-03 IMAGING — DX DG CHEST 1V PORT
1 series · 1 of 1 positions shown · non-contrast
Comparison: None.

CLINICAL DATA: Cough

EXAM:
PORTABLE CHEST 1 VIEW

[chest ap]
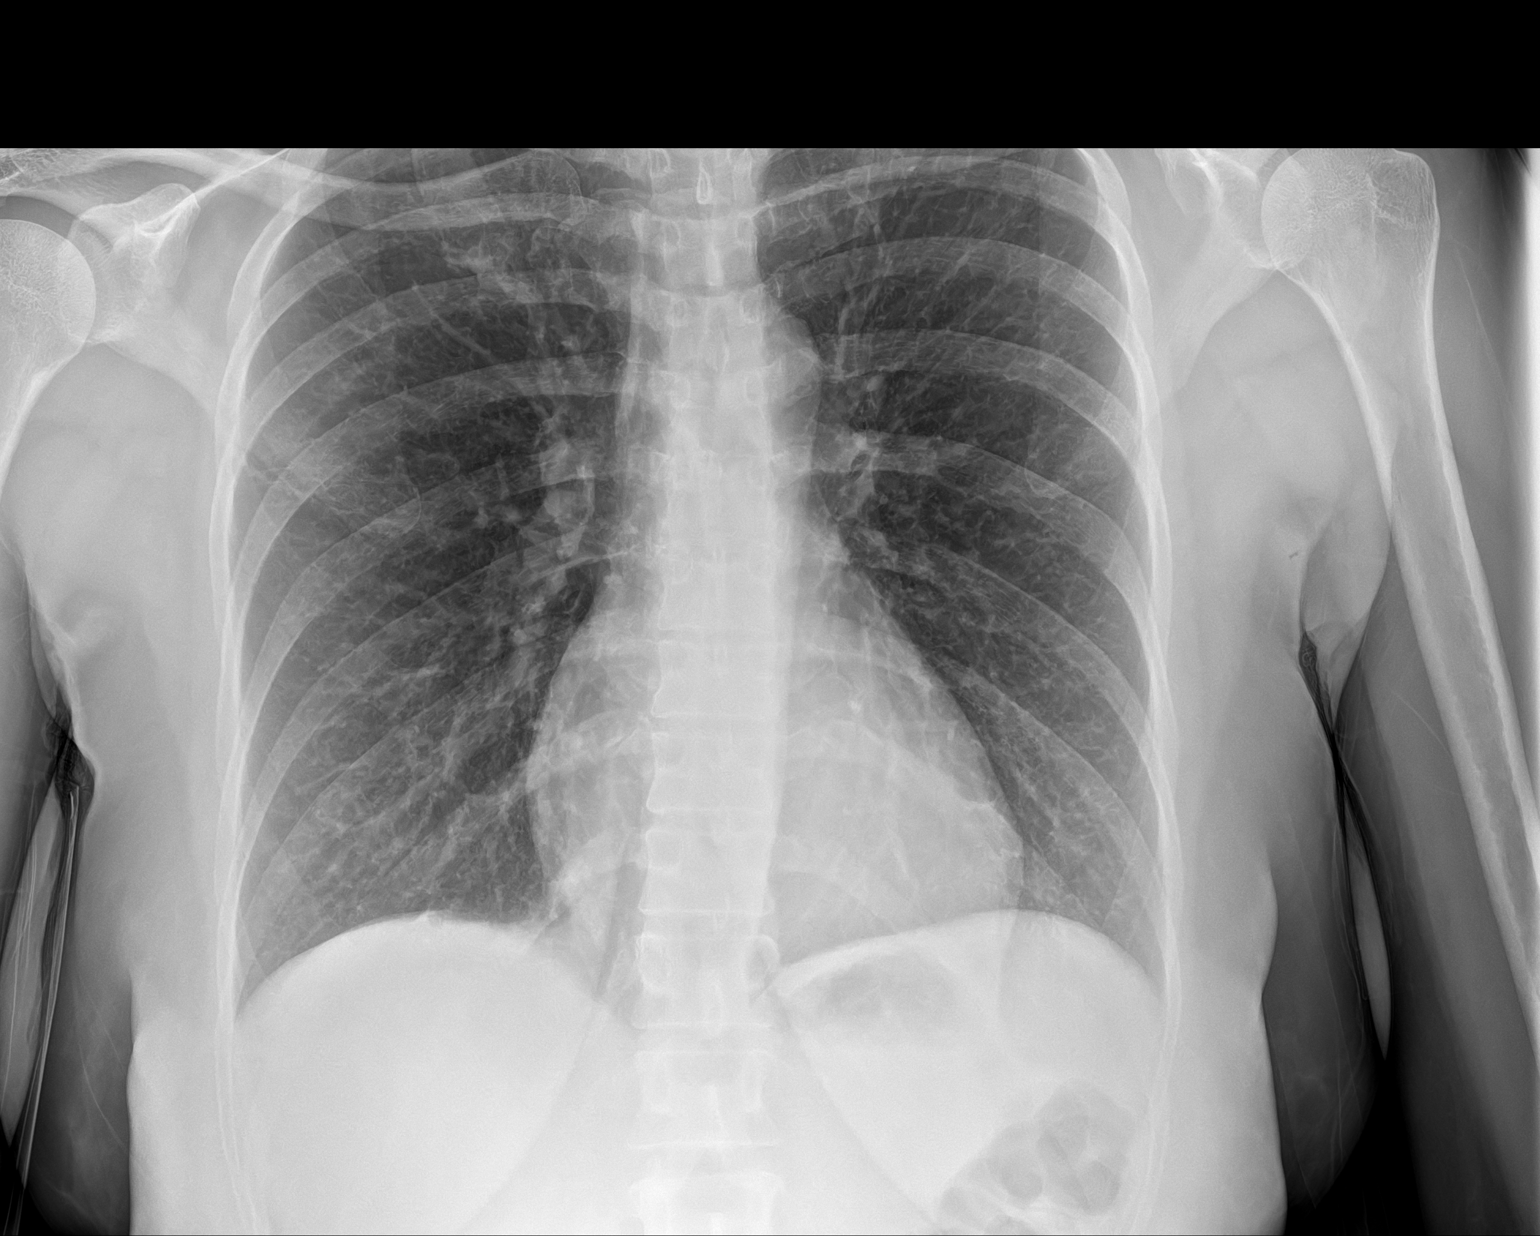

[1 of 1 positions shown; findings below may reference images not displayed]

FINDINGS: The heart size and mediastinal contours are within normal limits.
Both lungs are clear. The visualized skeletal structures are
unremarkable.
IMPRESSION: No active disease.

## 2020-08-04 ENCOUNTER — Other Ambulatory Visit (HOSPITAL_COMMUNITY): Payer: Self-pay

## 2020-08-05 ENCOUNTER — Other Ambulatory Visit (HOSPITAL_COMMUNITY): Payer: Self-pay

## 2020-08-08 ENCOUNTER — Other Ambulatory Visit (HOSPITAL_COMMUNITY): Payer: Self-pay

## 2020-08-16 ENCOUNTER — Other Ambulatory Visit: Payer: Self-pay | Admitting: Orthopedic Surgery

## 2020-08-16 ENCOUNTER — Other Ambulatory Visit (HOSPITAL_COMMUNITY): Payer: Self-pay

## 2020-08-16 DIAGNOSIS — M2242 Chondromalacia patellae, left knee: Secondary | ICD-10-CM | POA: Diagnosis not present

## 2020-08-16 DIAGNOSIS — D1622 Benign neoplasm of long bones of left lower limb: Secondary | ICD-10-CM | POA: Diagnosis not present

## 2020-08-16 DIAGNOSIS — G8918 Other acute postprocedural pain: Secondary | ICD-10-CM | POA: Diagnosis not present

## 2020-08-16 DIAGNOSIS — M6752 Plica syndrome, left knee: Secondary | ICD-10-CM | POA: Diagnosis not present

## 2020-08-16 DIAGNOSIS — D1620 Benign neoplasm of long bones of unspecified lower limb: Secondary | ICD-10-CM | POA: Diagnosis not present

## 2020-08-16 MED ORDER — OXYCODONE-ACETAMINOPHEN 5-325 MG PO TABS
1.0000 | ORAL_TABLET | Freq: Four times a day (QID) | ORAL | 0 refills | Status: DC | PRN
  Filled 2020-08-16: qty 30, 4d supply, fill #0

## 2020-08-22 ENCOUNTER — Other Ambulatory Visit (HOSPITAL_COMMUNITY): Payer: Self-pay

## 2020-08-23 ENCOUNTER — Other Ambulatory Visit (HOSPITAL_COMMUNITY): Payer: Self-pay

## 2020-08-23 MED ORDER — HYDROCODONE-ACETAMINOPHEN 5-325 MG PO TABS
1.0000 | ORAL_TABLET | Freq: Four times a day (QID) | ORAL | 0 refills | Status: DC | PRN
  Filled 2020-08-23: qty 25, 7d supply, fill #0

## 2020-08-24 DIAGNOSIS — Z9889 Other specified postprocedural states: Secondary | ICD-10-CM | POA: Diagnosis not present

## 2020-08-24 DIAGNOSIS — M25462 Effusion, left knee: Secondary | ICD-10-CM | POA: Diagnosis not present

## 2020-08-28 ENCOUNTER — Other Ambulatory Visit (HOSPITAL_COMMUNITY): Payer: Self-pay

## 2020-08-28 MED FILL — Zolpidem Tartrate Tab 10 MG: ORAL | 30 days supply | Qty: 30 | Fill #0 | Status: AC

## 2020-09-04 ENCOUNTER — Other Ambulatory Visit (HOSPITAL_COMMUNITY): Payer: Self-pay

## 2020-09-04 MED FILL — Metoprolol Succinate Tab ER 24HR 50 MG (Tartrate Equiv): ORAL | 90 days supply | Qty: 90 | Fill #0 | Status: AC

## 2020-09-13 DIAGNOSIS — R262 Difficulty in walking, not elsewhere classified: Secondary | ICD-10-CM | POA: Diagnosis not present

## 2020-09-13 DIAGNOSIS — M25662 Stiffness of left knee, not elsewhere classified: Secondary | ICD-10-CM | POA: Diagnosis not present

## 2020-09-13 DIAGNOSIS — M25462 Effusion, left knee: Secondary | ICD-10-CM | POA: Diagnosis not present

## 2020-09-27 DIAGNOSIS — R262 Difficulty in walking, not elsewhere classified: Secondary | ICD-10-CM | POA: Diagnosis not present

## 2020-09-27 DIAGNOSIS — M25662 Stiffness of left knee, not elsewhere classified: Secondary | ICD-10-CM | POA: Diagnosis not present

## 2020-09-27 DIAGNOSIS — M25462 Effusion, left knee: Secondary | ICD-10-CM | POA: Diagnosis not present

## 2020-09-28 ENCOUNTER — Other Ambulatory Visit (HOSPITAL_COMMUNITY): Payer: Self-pay

## 2020-09-28 MED ORDER — HYDROCODONE-ACETAMINOPHEN 5-325 MG PO TABS
1.0000 | ORAL_TABLET | Freq: Four times a day (QID) | ORAL | 0 refills | Status: DC | PRN
  Filled 2020-09-28: qty 15, 4d supply, fill #0

## 2020-12-07 ENCOUNTER — Other Ambulatory Visit: Payer: Self-pay

## 2020-12-07 ENCOUNTER — Ambulatory Visit: Payer: Self-pay

## 2020-12-07 ENCOUNTER — Other Ambulatory Visit: Payer: Self-pay | Admitting: Family Medicine

## 2020-12-07 DIAGNOSIS — M25572 Pain in left ankle and joints of left foot: Secondary | ICD-10-CM

## 2020-12-08 ENCOUNTER — Ambulatory Visit: Payer: 59 | Admitting: Podiatry

## 2020-12-10 MED FILL — Metoprolol Succinate Tab ER 24HR 50 MG (Tartrate Equiv): ORAL | 90 days supply | Qty: 90 | Fill #1 | Status: AC

## 2020-12-11 ENCOUNTER — Other Ambulatory Visit (HOSPITAL_COMMUNITY): Payer: Self-pay

## 2020-12-12 ENCOUNTER — Other Ambulatory Visit (HOSPITAL_COMMUNITY): Payer: Self-pay

## 2020-12-12 DIAGNOSIS — I10 Essential (primary) hypertension: Secondary | ICD-10-CM | POA: Diagnosis not present

## 2020-12-12 DIAGNOSIS — M25572 Pain in left ankle and joints of left foot: Secondary | ICD-10-CM | POA: Diagnosis not present

## 2020-12-12 MED ORDER — TRAMADOL HCL 50 MG PO TABS
50.0000 mg | ORAL_TABLET | ORAL | 0 refills | Status: DC
  Filled 2020-12-12: qty 30, 10d supply, fill #0

## 2021-01-02 DIAGNOSIS — M25562 Pain in left knee: Secondary | ICD-10-CM | POA: Diagnosis not present

## 2021-01-03 ENCOUNTER — Other Ambulatory Visit: Payer: Self-pay

## 2021-01-03 ENCOUNTER — Ambulatory Visit (HOSPITAL_COMMUNITY): Payer: PRIVATE HEALTH INSURANCE | Attending: Orthopedic Surgery

## 2021-01-03 DIAGNOSIS — M25561 Pain in right knee: Secondary | ICD-10-CM | POA: Diagnosis not present

## 2021-01-03 DIAGNOSIS — R2689 Other abnormalities of gait and mobility: Secondary | ICD-10-CM | POA: Insufficient documentation

## 2021-01-03 DIAGNOSIS — R29898 Other symptoms and signs involving the musculoskeletal system: Secondary | ICD-10-CM | POA: Diagnosis present

## 2021-01-03 NOTE — Therapy (Signed)
[place], [place], [address] Phone: [phone]   Fax:  [phone]  Physical Therapy Evaluation  Patient Details  Name: Virginia Hawkins MRN: [id number] Date of Birth: Jul 05, 1970 Referring Provider (PT): Edmonia Lynch, MD   Encounter Date: 01/03/2021   PT End of Session - 01/03/21 1646     Visit Number 1    Number of Visits 9    Date for PT Re-Evaluation 01/31/21    Authorization Type [facility] [employer]--This is WC (Claim# [id number]) approved for 1 eval and 8 tx (fax all progress notes to [phone])    Progress Note Due on Visit 9    PT Start Time 1645    PT Stop Time 1730    PT Time Calculation (min) 45 min    Activity Tolerance Patient limited by pain    Behavior During Therapy [facility] for tasks assessed/performed             Past Medical History:  Diagnosis Date   Fibromyalgia    Hypertension    Ovarian cyst    Vaginal Pap smear, abnormal     Past Surgical History:  Procedure Laterality Date   CYST EXCISION     FOOT SURGERY Left 2020   for plantar fasciitis   SALPINGECTOMY      There were no vitals filed for this visit.    Subjective Assessment - 01/03/21 1650     Subjective Pt experienced a fall on 12/05/20 when she slipped in water at work and resulting blunt trauma to right knee. No fractures that she is aware of from x-ray, MD reports maybe a contusion. 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 Reports severe pain along her right knee, posterior thigh, and groin to be the most problematic    Limitations Sitting;Lifting;Standing;Walking;House hold activities    How long can you sit comfortably? aching with prolonged sitting    How long can you stand comfortably? 15 min    How long can you walk comfortably? less than 15 minutes    Currently in Pain? Yes    Pain Score 8     Pain Location Knee    Pain  Orientation Right;Anterior    Pain Descriptors / Indicators Throbbing;Aching    Pain Type Acute pain    Pain Onset 1 to 4 weeks ago    Pain Frequency Constant    Aggravating Factors  walking, stairs, bending, squatting    Pain Relieving Factors heating pad,    Multiple Pain Sites Yes    Pain Score 9    Pain Location Groin    Pain Orientation Right    Pain Descriptors / Indicators Aching    Pain Type Acute pain    Pain Radiating Towards groin and posterior thigh RLE    Pain Onset 1 to 4 weeks ago    Pain Frequency Constant                [facility] PT Assessment - 01/03/21 0001       Assessment   Medical Diagnosis rt anterior knee pain    Referring Provider (PT) Edmonia Lynch, MD      Balance Screen   Has the patient fallen in the past 6 months Yes    How many times? 1    Has the patient had a decrease in activity level because of a fear of falling?  No    Is the patient reluctant to leave their home because of a fear of falling?  No      Home Ecologist residence      Prior Function   Level of Independence Independent    Vocation Full time employment    Vocation Requirements Phlebotomist      ROM / Strength   AROM / PROM / Strength AROM;Strength;PROM      AROM   AROM Assessment Site Knee;Hip    Right/Left Hip Right    Right Hip Extension 10    Right Hip Flexion 102    Right Hip External Rotation  30    Right Hip Internal Rotation  35    Right Hip ABduction 35    Right/Left Knee Right    Right Knee Extension 0    Right Knee Flexion 120      Strength   Strength Assessment Site Knee;Hip    Right/Left Hip Right    Right Hip Flexion 4-/5    Right Hip ABduction 3+/5    Right Hip ADduction 3+/5    Right/Left Knee Right    Right Knee Flexion 3+/5    Right Knee Extension 3+/5      Palpation   Patella mobility no issue, no lateral tilt or J-sign appreciated      Special Tests    Special Tests Hip Special Tests    Hip Special  Tests  Saralyn Pilar (FABER) Test;Hip Scouring      Saralyn Pilar [facility]) Test   Findings Positive    Side Right      Hip Scouring   Findings Positive    Side Right      Ambulation/Gait   Ambulation/Gait Yes    Ambulation/Gait Assistance 7: Independent    Gait Pattern Antalgic    Stairs Yes    Stairs Assistance 6: Modified independent (Device/Increase time)    Stair Management Technique Step to pattern                        Objective measurements completed on examination: See above findings.       [place] Adult PT Treatment/Exercise - 01/03/21 0001       Exercises   Exercises Knee/Hip      Knee/Hip Exercises: Supine   Quad Sets Strengthening;Right;2 sets;10 reps    Other Supine Knee/Hip Exercises pillow squeeze 2x10    Other Supine Knee/Hip Exercises hip abduction isometric 2x10                     PT Education - 01/03/21 1749     Education Details asked pt for MD to provide further assessment to right hip/groin given severity of pain. Education in use Programmer, applications and other modality techniques for self-mgmt to improve quality of sleep    Person(s) Educated Patient    Methods Explanation    Comprehension Verbalized understanding              PT Short Term Goals - 01/03/21 1756       PT SHORT TERM GOAL #1   Title Patient will be IND with initial HEP to improve functional outcomes    Time 2    Period Weeks    Status New    Target Date 01/17/21      PT SHORT TERM GOAL #2   Title Patient will report at least 25% improvement in symptoms for improved quality of life.  Baseline 8/10 right knee pain, 9/10 right hip/groin pain    Time 2    Period Weeks    Status New    Target Date 01/17/21      PT SHORT TERM GOAL #3   Title Demo right hip flexion to 120 degrees to improve functional mobility    Baseline 102 degrees right hip flexion    Time 2    Period Weeks    Status New    Target Date 01/17/21               PT Long Term Goals -  01/03/21 1757       PT LONG TERM GOAL #1   Title Ambulate with normalized gait pattern over various surfaces x 6 minutes without increase in pain    Baseline antalgic, 8-9/10 RLE pain    Time 4    Period Weeks    Status New    Target Date 01/31/21      PT LONG TERM GOAL #2   Title Manifest improved RLE strength and activity tolerance as evidenced by reciprocal stair ambulation    Baseline step-to    Time 4    Period Weeks    Status New    Target Date 01/31/21      PT LONG TERM GOAL #3   Title Patient will report at least 75% improvement in symptoms for improved quality of life.    Time 4    Period Weeks    Status New    Target Date 01/31/21                    Plan - 01/03/21 1751     Clinical Impression Statement Patient is 50 yo lady who experienced slip and fall at work with resulting impact injury to right knee/RLE and presents with severe pain rating and demonstrates pain limited motion of her right hip more than knee, weakness of RLE, difficulty in walking, gait abnormalities, reduced functional activity tolerance, and reduced ability to perform employment duties due to deficits and limitations.  PT services indicated to ameliorate symptoms and improve ROM/strength and facilitate return to PLOF.    Personal Factors and Comorbidities Time since onset of injury/illness/exacerbation;Comorbidity 1    Comorbidities pt notes past knee injuries and orthopedic issues    Examination-Activity Limitations Bend;Carry;Lift;Stand;Stairs;Squat;Sleep;Locomotion Level;Transfers    Examination-Participation Restrictions Cleaning;Community Activity;Driving;Occupation;Meal Prep    Stability/Clinical Decision Making Stable/Uncomplicated    Clinical Decision Making Low    Rehab Potential Good    PT Frequency 2x / week    PT Duration 4 weeks    PT Treatment/Interventions ADLs/Self Care Home Management;Aquatic Therapy;Electrical Stimulation;DME Instruction;Traction;Moist Biomedical engineer;Therapeutic activities;Therapeutic exercise;Balance training;Neuromuscular re-education;Patient/family education;Passive range of motion;Dry needling;Taping;Spinal Manipulations;Joint Manipulations;Manual techniques    PT Next Visit Plan progress to open chain strength for knee, continue with hip assessment and activity as tolerated    PT Home Exercise Plan QS, hip add/abd isometric    Consulted and Agree with Plan of Care Patient             Patient will benefit from skilled therapeutic intervention in order to improve the following deficits and impairments:  Abnormal gait, Decreased activity tolerance, Decreased mobility, Decreased range of motion, Decreased strength, Difficulty walking, Improper body mechanics, Pain  Visit Diagnosis: Acute pain of right knee  Other abnormalities of gait and mobility  Other symptoms and signs involving the musculoskeletal system     Problem List Patient Active Problem List  Diagnosis Date Noted   Amenorrhea 05/01/2020   Plantar fasciitis 09/01/2017   History of ovarian cyst 03/23/2015   History of abnormal cervical Pap smear 03/23/2015   Fibromyalgia 03/23/2015   Hypertension, essential 10/05/2014   Vitiligo 10/05/2014    Toniann Fail, PT 01/03/2021, 6:01 PM  [place] [address] [place], [place], [address] Phone: [phone]   Fax:  [phone]  Name: Virginia Hawkins MRN: [id number] Date of Birth: [birth year]

## 2021-01-05 ENCOUNTER — Ambulatory Visit (HOSPITAL_COMMUNITY): Payer: PRIVATE HEALTH INSURANCE | Attending: Orthopedic Surgery

## 2021-01-05 ENCOUNTER — Encounter (HOSPITAL_COMMUNITY): Payer: Self-pay

## 2021-01-05 ENCOUNTER — Other Ambulatory Visit: Payer: Self-pay

## 2021-01-05 DIAGNOSIS — M25561 Pain in right knee: Secondary | ICD-10-CM | POA: Insufficient documentation

## 2021-01-05 DIAGNOSIS — R29898 Other symptoms and signs involving the musculoskeletal system: Secondary | ICD-10-CM | POA: Diagnosis present

## 2021-01-05 DIAGNOSIS — R2689 Other abnormalities of gait and mobility: Secondary | ICD-10-CM | POA: Diagnosis present

## 2021-01-05 NOTE — Therapy (Signed)
[name], [place], [address] Phone: [phone]   Fax:  [phone]  Physical Therapy Treatment  Patient Details  Name: Virginia Hawkins MRN: [id number] Date of Birth: May 24, 1970 Referring Provider (PT): Edmonia Lynch, MD   Encounter Date: 01/05/2021   PT End of Session - 01/05/21 1534     Visit Number 2    Number of Visits 9    Date for PT Re-Evaluation 01/31/21    Authorization Type [facility]--This is WC (Claim# [id number]) approved for 1 eval and 8 tx (fax all progress notes to [phone])    Progress Note Due on Visit 9    PT Start Time 1530    PT Stop Time 1609    PT Time Calculation (min) 39 min    Activity Tolerance Patient limited by pain    Behavior During Therapy [facility] for tasks assessed/performed             Past Medical History:  Diagnosis Date   Fibromyalgia    Hypertension    Ovarian cyst    Vaginal Pap smear, abnormal     Past Surgical History:  Procedure Laterality Date   CYST EXCISION     FOOT SURGERY Left 2020   for plantar fasciitis   SALPINGECTOMY      There were no vitals filed for this visit.   Subjective Assessment - 01/05/21 1532     Subjective Pt reports constant sharp pain Rt thigh, groin and lateral knee.  Pain is worse at night.  Reports heat, elevation and rubbing help.  Has began the HEP without questions.    Patient Stated Goals To reduce pain.    Currently in Pain? Yes    Pain Score 8     Pain Location Knee    Pain Orientation Right;Anterior;Lateral    Pain Descriptors / Indicators Spasm;Throbbing;Aching    Pain Type Acute pain    Pain Onset 1 to 4 weeks ago    Pain Frequency Constant    Aggravating Factors  walking, stair, bening, squatting    Pain Relieving Factors heat pad, elevation and rubbing                [facility] PT Assessment - 01/05/21 0001       Assessment   Medical Diagnosis rt anterior knee pain    Referring  Provider (PT) Edmonia Lynch, MD    Hand Dominance Right    Next MD Visit 01/15/21                           [facility] Adult PT Treatment/Exercise - 01/05/21 0001       Exercises   Exercises Knee/Hip      Knee/Hip Exercises: Stretches   Quad Stretch 30 seconds    Quad Stretch Limitations increased medial knee pain    Other Knee/Hip Stretches Butterfly stretch 3x 30"    Other Knee/Hip Stretches Faber stretch Rt 3x 30"      Knee/Hip Exercises: Supine   Quad Sets 10 reps    Quad Sets Limitations 5" holds    Heel Slides 2 sets;5 reps    Other Supine Knee/Hip Exercises pillow squeeze 2x10    Other Supine Knee/Hip Exercises hip abduction isometric 2x10      Knee/Hip Exercises: Sidelying   Hip ABduction 10 reps      Knee/Hip Exercises: Prone   Hamstring Curl 10 reps    Hamstring Curl Limitations  c/o knee popping    Hip Extension 10 reps                     PT Education - 01/05/21 1538     Education Details Reviewed goals, educated importance of HEP compliance, pt able to recall and demonstrate appropriately.  Reviewed RICE techniuqes for pain control, pt stated she enjoys heat, educated on edema can cause pain and to alternate if edema presents.  Requested asked pt for MD to provide further assessment to right hip/groin given severity of pain, pt stated MD apt on 01/15/21.    Person(s) Educated Patient    Methods Explanation    Comprehension Verbalized understanding              PT Short Term Goals - 01/03/21 1756       PT SHORT TERM GOAL #1   Title Patient will be IND with initial HEP to improve functional outcomes    Time 2    Period Weeks    Status New    Target Date 01/17/21      PT SHORT TERM GOAL #2   Title Patient will report at least 25% improvement in symptoms for improved quality of life.    Baseline 8/10 right knee pain, 9/10 right hip/groin pain    Time 2    Period Weeks    Status New    Target Date 01/17/21      PT SHORT  TERM GOAL #3   Title Demo right hip flexion to 120 degrees to improve functional mobility    Baseline 102 degrees right hip flexion    Time 2    Period Weeks    Status New    Target Date 01/17/21               PT Long Term Goals - 01/03/21 1757       PT LONG TERM GOAL #1   Title Ambulate with normalized gait pattern over various surfaces x 6 minutes without increase in pain    Baseline antalgic, 8-9/10 RLE pain    Time 4    Period Weeks    Status New    Target Date 01/31/21      PT LONG TERM GOAL #2   Title Manifest improved RLE strength and activity tolerance as evidenced by reciprocal stair ambulation    Baseline step-to    Time 4    Period Weeks    Status New    Target Date 01/31/21      PT LONG TERM GOAL #3   Title Patient will report at least 75% improvement in symptoms for improved quality of life.    Time 4    Period Weeks    Status New    Target Date 01/31/21                   Plan - 01/05/21 1539     Clinical Impression Statement Reviewed goals, educated importance of HEP compliance that pt was able to recall and demonstrate.  Session focus on knee mobility and strengthening.  Pt limited by pain with all knee mobilty and strenghtening based exericses.  Improved tolerance with hip based exercises.  Added groin/FABER stretch and hip strengthening exercises to HEP, verbalized understanding and handout given.    Personal Factors and Comorbidities Time since onset of injury/illness/exacerbation;Comorbidity 1    Comorbidities pt notes past knee injuries and orthopedic issues    Examination-Activity Limitations Bend;Carry;Lift;Stand;Stairs;Squat;Sleep;Locomotion Level;Transfers  Examination-Participation Restrictions Cleaning;Community Activity;Driving;Occupation;Meal Prep    Stability/Clinical Decision Making Stable/Uncomplicated    Clinical Decision Making Low    Rehab Potential Good    PT Frequency 2x / week    PT Duration 4 weeks    PT  Treatment/Interventions ADLs/Self Care Home Management;Aquatic Therapy;Electrical Stimulation;DME Instruction;Traction;Moist Solicitor;Therapeutic activities;Therapeutic exercise;Balance training;Neuromuscular re-education;Patient/family education;Passive range of motion;Dry needling;Taping;Spinal Manipulations;Joint Manipulations;Manual techniques    PT Next Visit Plan progress to open chain strength for knee, continue with hip assessment and activity as tolerated    PT Home Exercise Plan QS, hip add/abd isometric; 10/14: butterfly, FABER stretch, hip abd and extension.    Consulted and Agree with Plan of Care Patient             Patient will benefit from skilled therapeutic intervention in order to improve the following deficits and impairments:  Abnormal gait, Decreased activity tolerance, Decreased mobility, Decreased range of motion, Decreased strength, Difficulty walking, Improper body mechanics, Pain  Visit Diagnosis: Acute pain of right knee  Other abnormalities of gait and mobility  Other symptoms and signs involving the musculoskeletal system     Problem List Patient Active Problem List   Diagnosis Date Noted   Amenorrhea 05/01/2020   Plantar fasciitis 09/01/2017   History of ovarian cyst 03/23/2015   History of abnormal cervical Pap smear 03/23/2015   Fibromyalgia 03/23/2015   Hypertension, essential 10/05/2014   Vitiligo 10/05/2014   Ihor Austin, LPTA/CLT; CBIS [phone]  Aldona Lento, PTA 01/05/2021, 4:18 PM  [facility] [address] [place], [place], [address] Phone: [phone]   Fax:  [phone]  Name: Beola Vasallo MRN: [id number] Date of Birth: 03/02/1971

## 2021-01-09 ENCOUNTER — Encounter (HOSPITAL_COMMUNITY): Payer: Self-pay

## 2021-01-09 ENCOUNTER — Other Ambulatory Visit: Payer: Self-pay

## 2021-01-09 ENCOUNTER — Ambulatory Visit (HOSPITAL_COMMUNITY): Payer: PRIVATE HEALTH INSURANCE | Attending: Orthopedic Surgery

## 2021-01-09 DIAGNOSIS — R29898 Other symptoms and signs involving the musculoskeletal system: Secondary | ICD-10-CM | POA: Diagnosis present

## 2021-01-09 DIAGNOSIS — M25561 Pain in right knee: Secondary | ICD-10-CM | POA: Diagnosis not present

## 2021-01-09 DIAGNOSIS — M25572 Pain in left ankle and joints of left foot: Secondary | ICD-10-CM

## 2021-01-09 DIAGNOSIS — R2689 Other abnormalities of gait and mobility: Secondary | ICD-10-CM | POA: Diagnosis present

## 2021-01-09 NOTE — Therapy (Signed)
[place], [place], [address] Phone: [phone]   Fax:  [phone]  Physical Therapy Treatment  Patient Details  Name: Virginia Hawkins MRN: [id number] Date of Birth: February 17, 1971 Referring Provider (PT): Edmonia Lynch, MD   Encounter Date: 01/09/2021   PT End of Session - 01/09/21 1709     Visit Number 3    Number of Visits 9    Date for PT Re-Evaluation 01/31/21    Authorization Type [facility]--This is WC (Claim# [id number]) approved for 1 eval and 8 tx (fax all progress notes to [phone])    Progress Note Due on Visit 9    PT Start Time 1703    PT Stop Time 1741    PT Time Calculation (min) 38 min    Activity Tolerance Patient limited by pain    Behavior During Therapy [facility] for tasks assessed/performed             Past Medical History:  Diagnosis Date   Fibromyalgia    Hypertension    Ovarian cyst    Vaginal Pap smear, abnormal     Past Surgical History:  Procedure Laterality Date   CYST EXCISION     FOOT SURGERY Left 2020   for plantar fasciitis   SALPINGECTOMY      There were no vitals filed for this visit.   Subjective Assessment - 01/09/21 1705     Subjective Pt stated increased Rt knee, thigh and groin pain due to the cold weather.  Reports she has been compliant wiht HEP.  Reports she notices a lot of pressure in the knees when walking and going up stairs.    Patient Stated Goals To reduce pain.    Currently in Pain? Yes    Pain Score 9     Pain Location Knee    Pain Orientation Right;Anterior;Lateral    Pain Descriptors / Indicators Aching;Sore    Pain Type Acute pain    Pain Onset 1 to 4 weeks ago    Pain Frequency Constant    Aggravating Factors  walking, stair, bending, squatting    Pain Relieving Factors heat pad, elevation and rubbing                [facility] PT Assessment - 01/09/21 0001       Assessment   Medical Diagnosis rt anterior knee  pain    Referring Provider (PT) Edmonia Lynch, MD    Hand Dominance Right    Next MD Visit 01/15/21                           [facility] Adult PT Treatment/Exercise - 01/09/21 0001       Exercises   Exercises Knee/Hip      Knee/Hip Exercises: Stretches   Active Hamstring Stretch 3 reps;30 seconds    Active Hamstring Stretch Limitations hands behind knee    ITB Stretch 2 reps;20 seconds    ITB Stretch Limitations supine with rope      Knee/Hip Exercises: Seated   Long Arc Quad 10 reps      Knee/Hip Exercises: Supine   Quad Sets 10 reps    Quad Sets Limitations 5"holds    Short Arc Quad Sets 2 sets;10 reps    Heel Slides 2 sets;5 reps    Bridges 10 reps    Patellar Mobs good patella mobility      Knee/Hip Exercises: Sidelying   Hip  ABduction 10 reps      Knee/Hip Exercises: Prone   Hip Extension 10 reps                       PT Short Term Goals - 01/03/21 1756       PT SHORT TERM GOAL #1   Title Patient will be IND with initial HEP to improve functional outcomes    Time 2    Period Weeks    Status New    Target Date 01/17/21      PT SHORT TERM GOAL #2   Title Patient will report at least 25% improvement in symptoms for improved quality of life.    Baseline 8/10 right knee pain, 9/10 right hip/groin pain    Time 2    Period Weeks    Status New    Target Date 01/17/21      PT SHORT TERM GOAL #3   Title Demo right hip flexion to 120 degrees to improve functional mobility    Baseline 102 degrees right hip flexion    Time 2    Period Weeks    Status New    Target Date 01/17/21               PT Long Term Goals - 01/03/21 1757       PT LONG TERM GOAL #1   Title Ambulate with normalized gait pattern over various surfaces x 6 minutes without increase in pain    Baseline antalgic, 8-9/10 RLE pain    Time 4    Period Weeks    Status New    Target Date 01/31/21      PT LONG TERM GOAL #2   Title Manifest improved RLE  strength and activity tolerance as evidenced by reciprocal stair ambulation    Baseline step-to    Time 4    Period Weeks    Status New    Target Date 01/31/21      PT LONG TERM GOAL #3   Title Patient will report at least 75% improvement in symptoms for improved quality of life.    Time 4    Period Weeks    Status New    Target Date 01/31/21                   Plan - 01/09/21 1751     Clinical Impression Statement Pt limited by pain with all exercises.  Improved tolerance with SAQ vs quad sets with degrees and reports of decreased popping.  Added stretches for hamstring and ITB mobility, pt did not tolerate well with ITB stretch.  Reviewed RICE techniques for pain and edema control.  Pain scale stated at 9/10, reports of increased proximal patella pain.    Personal Factors and Comorbidities Time since onset of injury/illness/exacerbation;Comorbidity 1    Comorbidities pt notes past knee injuries and orthopedic issues    Examination-Activity Limitations Bend;Carry;Lift;Stand;Stairs;Squat;Sleep;Locomotion Level;Transfers    Examination-Participation Restrictions Cleaning;Community Activity;Driving;Occupation;Meal Prep    Stability/Clinical Decision Making Stable/Uncomplicated    Clinical Decision Making Low    Rehab Potential Good    PT Frequency 2x / week    PT Duration 4 weeks    PT Treatment/Interventions ADLs/Self Care Home Management;Aquatic Therapy;Electrical Stimulation;DME Instruction;Traction;Moist Solicitor;Therapeutic activities;Therapeutic exercise;Balance training;Neuromuscular re-education;Patient/family education;Passive range of motion;Dry needling;Taping;Spinal Manipulations;Joint Manipulations;Manual techniques    PT Next Visit Plan progress to open chain strength for knee, continue with hip assessment and activity as tolerated  PT Home Exercise Plan QS, hip add/abd isometric; 10/14: butterfly, FABER stretch, hip abd and extension.     Consulted and Agree with Plan of Care Patient             Patient will benefit from skilled therapeutic intervention in order to improve the following deficits and impairments:  Abnormal gait, Decreased activity tolerance, Decreased mobility, Decreased range of motion, Decreased strength, Difficulty walking, Improper body mechanics, Pain  Visit Diagnosis: Acute pain of right knee  Other abnormalities of gait and mobility  Other symptoms and signs involving the musculoskeletal system  Pain in left ankle and joints of left foot     Problem List Patient Active Problem List   Diagnosis Date Noted   Amenorrhea 05/01/2020   Plantar fasciitis 09/01/2017   History of ovarian cyst 03/23/2015   History of abnormal cervical Pap smear 03/23/2015   Fibromyalgia 03/23/2015   Hypertension, essential 10/05/2014   Vitiligo 10/05/2014   Ihor Austin, LPTA/CLT; CBIS [phone]  Aldona Lento, PTA 01/09/2021, 5:59 PM  [facility] [address] [place], [place], [address] Phone: [phone]   Fax:  [phone]  Name: Virginia Hawkins MRN: [id number] Date of Birth: 1970-09-08

## 2021-01-12 ENCOUNTER — Other Ambulatory Visit: Payer: Self-pay

## 2021-01-12 ENCOUNTER — Encounter (HOSPITAL_COMMUNITY): Payer: 59

## 2021-01-12 ENCOUNTER — Ambulatory Visit (HOSPITAL_COMMUNITY): Payer: PRIVATE HEALTH INSURANCE | Attending: Orthopedic Surgery

## 2021-01-12 DIAGNOSIS — R2689 Other abnormalities of gait and mobility: Secondary | ICD-10-CM | POA: Insufficient documentation

## 2021-01-12 DIAGNOSIS — R29898 Other symptoms and signs involving the musculoskeletal system: Secondary | ICD-10-CM | POA: Insufficient documentation

## 2021-01-12 DIAGNOSIS — M25561 Pain in right knee: Secondary | ICD-10-CM | POA: Insufficient documentation

## 2021-01-12 NOTE — Therapy (Signed)
[place], [place], [address] Phone: [phone]   Fax:  [phone]  Physical Therapy Treatment  Patient Details  Name: Virginia Hawkins MRN: [id number] Date of Birth: 1970/07/21 Referring Provider (PT): Edmonia Lynch, MD   Encounter Date: 01/12/2021   PT End of Session - 01/12/21 1557     Visit Number 4    Number of Visits 9    Date for PT Re-Evaluation 01/31/21    Authorization Type [facility]--This is WC (Claim# [id number]) approved for 1 eval and 8 tx (fax all progress notes to [phone])    Progress Note Due on Visit 9    PT Start Time 1515    PT Stop Time 1600    PT Time Calculation (min) 45 min    Activity Tolerance Patient limited by pain    Behavior During Therapy [facility] for tasks assessed/performed             Past Medical History:  Diagnosis Date   Fibromyalgia    Hypertension    Ovarian cyst    Vaginal Pap smear, abnormal     Past Surgical History:  Procedure Laterality Date   CYST EXCISION     FOOT SURGERY Left 2020   for plantar fasciitis   SALPINGECTOMY      There were no vitals filed for this visit.   Subjective Assessment - 01/12/21 1518     Subjective More groin pain than knee pain today. Has f/u with MD on Monday. Notes more hip/groin pain with prolonged sitting. Feels like a pulling pain in groin.    How long can you sit comfortably? aching with prolonged sitting    Patient Stated Goals To reduce pain.    Pain Onset 1 to 4 weeks ago                [facility] PT Assessment - 01/12/21 0001       Assessment   Medical Diagnosis rt anterior knee pain    Referring Provider (PT) Edmonia Lynch, MD    Hand Dominance Right    Next MD Visit 01/15/21      [facility]   Right/Left Hip Right    Right Hip Extension 11    Right Hip Flexion 110    Right Hip External Rotation  35    Right Hip Internal Rotation  35    Right Hip ABduction 35   complains of ache and  pulling discomfort in right groin   Right/Left Knee Right    Right Knee Extension 0    Right Knee Flexion 120      Saralyn Pilar (FABER) Test   Findings Negative    Side Right      Hip Scouring   Findings Unable to test   guarded to test and right hip resistant to passive movements                          [facility] Adult PT Treatment/Exercise - 01/12/21 0001       Knee/Hip Exercises: Stretches   Active Hamstring Stretch Right    Active Hamstring Stretch Limitations hands behind knee    Other Knee/Hip Stretches "Thomas test" stretch with leg over EOB, poorly tolerated due to stretch in groin      Knee/Hip Exercises: Supine   Quad Sets Strengthening;Right;3 sets;10 reps    Quad Sets Limitations 5" hold    Bridges Strengthening;Both;2 sets;10 reps  PT Short Term Goals - 01/03/21 1756       PT SHORT TERM GOAL #1   Title Patient will be IND with initial HEP to improve functional outcomes    Time 2    Period Weeks    Status New    Target Date 01/17/21      PT SHORT TERM GOAL #2   Title Patient will report at least 25% improvement in symptoms for improved quality of life.    Baseline 8/10 right knee pain, 9/10 right hip/groin pain    Time 2    Period Weeks    Status New    Target Date 01/17/21      PT SHORT TERM GOAL #3   Title Demo right hip flexion to 120 degrees to improve functional mobility    Baseline 102 degrees right hip flexion    Time 2    Period Weeks    Status New    Target Date 01/17/21               PT Long Term Goals - 01/03/21 1757       PT LONG TERM GOAL #1   Title Ambulate with normalized gait pattern over various surfaces x 6 minutes without increase in pain    Baseline antalgic, 8-9/10 RLE pain    Time 4    Period Weeks    Status New    Target Date 01/31/21      PT LONG TERM GOAL #2   Title Manifest improved RLE strength and activity tolerance as evidenced by reciprocal stair ambulation     Baseline step-to    Time 4    Period Weeks    Status New    Target Date 01/31/21      PT LONG TERM GOAL #3   Title Patient will report at least 75% improvement in symptoms for improved quality of life.    Time 4    Period Weeks    Status New    Target Date 01/31/21                   Plan - 01/12/21 1557     Clinical Impression Statement Reports increase in medial thigh/groin pain on right side more than knee issue during today's session.  Increased pain with stretching to right adductors and indicates right groin/joint line as area of discomfort.  Overall very poor activity tolerance and increased pain response with active movements and static stretching is also not tolerated very well.  Able to perform resisted hip flexion with minimal discomfort against manual resistance. Hip demonstrates relatively good ROM to assesment with main limitation being a stretch/discomfort to thigh adductors. Unable to assess Scour's to right hip due to guarding.  Pt has f/u with ortho MD Monday, and hopefully his assessment will be more revealing for pathological tissue    Personal Factors and Comorbidities Time since onset of injury/illness/exacerbation;Comorbidity 1    Comorbidities pt notes past knee injuries and orthopedic issues    Examination-Activity Limitations Bend;Carry;Lift;Stand;Stairs;Squat;Sleep;Locomotion Level;Transfers    Examination-Participation Restrictions Cleaning;Community Activity;Driving;Occupation;Meal Prep    Stability/Clinical Decision Making Stable/Uncomplicated    Rehab Potential Good    PT Frequency 2x / week    PT Duration 4 weeks    PT Treatment/Interventions ADLs/Self Care Home Management;Aquatic Therapy;Electrical Stimulation;DME Instruction;Traction;Moist Solicitor;Therapeutic activities;Therapeutic exercise;Balance training;Neuromuscular re-education;Patient/family education;Passive range of motion;Dry needling;Taping;Spinal  Manipulations;Joint Manipulations;Manual techniques    PT Next Visit Plan progress to open chain strength for knee, continue  with hip assessment and activity as tolerated    PT Home Exercise Plan QS, hip add/abd isometric; 10/14: butterfly, FABER stretch, hip abd and extension.    Consulted and Agree with Plan of Care Patient             Patient will benefit from skilled therapeutic intervention in order to improve the following deficits and impairments:  Abnormal gait, Decreased activity tolerance, Decreased mobility, Decreased range of motion, Decreased strength, Difficulty walking, Improper body mechanics, Pain  Visit Diagnosis: Acute pain of right knee  Other abnormalities of gait and mobility  Other symptoms and signs involving the musculoskeletal system     Problem List Patient Active Problem List   Diagnosis Date Noted   Amenorrhea 05/01/2020   Plantar fasciitis 09/01/2017   History of ovarian cyst 03/23/2015   History of abnormal cervical Pap smear 03/23/2015   Fibromyalgia 03/23/2015   Hypertension, essential 10/05/2014   Vitiligo 10/05/2014    Toniann Fail, PT 01/12/2021, 4:01 PM  [facility] [address] [place], [place], [address] Phone: [phone]   Fax:  [phone]  Name: Virginia Hawkins MRN: [id number] Date of Birth: October 25, 1970

## 2021-01-16 ENCOUNTER — Ambulatory Visit: Payer: 59 | Admitting: Podiatry

## 2021-01-17 ENCOUNTER — Ambulatory Visit (HOSPITAL_COMMUNITY): Payer: PRIVATE HEALTH INSURANCE | Admitting: Physical Therapy

## 2021-01-17 ENCOUNTER — Other Ambulatory Visit: Payer: Self-pay

## 2021-01-17 ENCOUNTER — Encounter (HOSPITAL_COMMUNITY): Payer: Self-pay | Admitting: Physical Therapy

## 2021-01-17 DIAGNOSIS — R2689 Other abnormalities of gait and mobility: Secondary | ICD-10-CM

## 2021-01-17 DIAGNOSIS — R29898 Other symptoms and signs involving the musculoskeletal system: Secondary | ICD-10-CM

## 2021-01-17 DIAGNOSIS — M25561 Pain in right knee: Secondary | ICD-10-CM | POA: Diagnosis not present

## 2021-01-17 NOTE — Patient Instructions (Signed)
Access Code: [email or web address] URL: [email or web address] Date: 01/17/2021 Prepared by: Josue Hector  Exercises Hooklying Isometric Hip Flexion with Opposite Arm - 2 x daily - 7 x weekly - 2 sets - 10 reps - 5 seconds hold Supine Bridge - 2 x daily - 7 x weekly - 2 sets - 10 reps - 5 seconds hold

## 2021-01-17 NOTE — Therapy (Signed)
[place], [place], [address] Phone: [phone]   Fax:  [phone]  Physical Therapy Treatment  Patient Details  Name: Virginia Hawkins MRN: [id number] Date of Birth: 17-Sep-1970 Referring Provider (PT): Edmonia Lynch, MD   Encounter Date: 01/17/2021   PT End of Session - 01/17/21 1609     Visit Number 5    Number of Visits 9    Date for PT Re-Evaluation 01/31/21    Authorization Type [facility]--This is WC (Claim# [id number]) approved for 1 eval and 8 tx (fax all progress notes to [phone])    Progress Note Due on Visit 9    PT Start Time 1610   arrived late   PT Stop Time 1645    PT Time Calculation (min) 35 min    Activity Tolerance Patient limited by pain    Behavior During Therapy [facility] for tasks assessed/performed             Past Medical History:  Diagnosis Date   Fibromyalgia    Hypertension    Ovarian cyst    Vaginal Pap smear, abnormal     Past Surgical History:  Procedure Laterality Date   CYST EXCISION     FOOT SURGERY Left 2020   for plantar fasciitis   SALPINGECTOMY      There were no vitals filed for this visit.   Subjective Assessment - 01/17/21 1613     Subjective Patient states she is having pain in knee, hip and groin. Exercises are going "pretty good for as much as I can do".    How long can you sit comfortably? aching with prolonged sitting    Patient Stated Goals To reduce pain.    Currently in Pain? Yes    Pain Score 8     Pain Location Knee    Pain Orientation Right;Anterior    Pain Descriptors / Indicators Aching    Pain Type Acute pain    Pain Onset 1 to 4 weeks ago                               [facility] Adult PT Treatment/Exercise - 01/17/21 0001       Knee/Hip Exercises: Supine   Quad Sets Right;15 reps    Quad Sets Limitations 5" hold    Heel Slides Right;10 reps    Bridges 2 sets;10 reps    Other Supine Knee/Hip  Exercises iso hip adduction (foam roll 15 x 5")    Other Supine Knee/Hip Exercises isometric hip flexion 15 x 5"      Knee/Hip Exercises: Sidelying   Hip ABduction Right;2 sets;10 reps                       PT Short Term Goals - 01/03/21 1756       PT SHORT TERM GOAL #1   Title Patient will be IND with initial HEP to improve functional outcomes    Time 2    Period Weeks    Status New    Target Date 01/17/21      PT SHORT TERM GOAL #2   Title Patient will report at least 25% improvement in symptoms for improved quality of life.    Baseline 8/10 right knee pain, 9/10 right hip/groin pain    Time 2    Period Weeks    Status New    Target Date  01/17/21      PT SHORT TERM GOAL #3   Title Demo right hip flexion to 120 degrees to improve functional mobility    Baseline 102 degrees right hip flexion    Time 2    Period Weeks    Status New    Target Date 01/17/21               PT Long Term Goals - 01/03/21 1757       PT LONG TERM GOAL #1   Title Ambulate with normalized gait pattern over various surfaces x 6 minutes without increase in pain    Baseline antalgic, 8-9/10 RLE pain    Time 4    Period Weeks    Status New    Target Date 01/31/21      PT LONG TERM GOAL #2   Title Manifest improved RLE strength and activity tolerance as evidenced by reciprocal stair ambulation    Baseline step-to    Time 4    Period Weeks    Status New    Target Date 01/31/21      PT LONG TERM GOAL #3   Title Patient will report at least 75% improvement in symptoms for improved quality of life.    Time 4    Period Weeks    Status New    Target Date 01/31/21                   Plan - 01/17/21 1746     Clinical Impression Statement Patient tolerated session fairly well today. Discussed alternatives and joint positioning for improved comfort with previous exercises. Introduced hip flexion isometrics for improved hip stabilization in pain free range. Educated  patient on pacing activity for reduced pain with exercise and answered all questions. Issued updated HEP handout. Patient will continue to benefit from skilled therapy services to progress LE strengthening for reduced pain and improved functional mobility.    Personal Factors and Comorbidities Time since onset of injury/illness/exacerbation;Comorbidity 1    Comorbidities pt notes past knee injuries and orthopedic issues    Examination-Activity Limitations Bend;Carry;Lift;Stand;Stairs;Squat;Sleep;Locomotion Level;Transfers    Examination-Participation Restrictions Cleaning;Community Activity;Driving;Occupation;Meal Prep    Stability/Clinical Decision Making Stable/Uncomplicated    Rehab Potential Good    PT Frequency 2x / week    PT Duration 4 weeks    PT Treatment/Interventions ADLs/Self Care Home Management;Aquatic Therapy;Electrical Stimulation;DME Instruction;Traction;Moist Solicitor;Therapeutic activities;Therapeutic exercise;Balance training;Neuromuscular re-education;Patient/family education;Passive range of motion;Dry needling;Taping;Spinal Manipulations;Joint Manipulations;Manual techniques    PT Next Visit Plan progress to open chain strength for knee, continue with hip assessment and activity as tolerated    PT Home Exercise Plan QS, hip add/abd isometric; 10/14: butterfly, FABER stretch, hip abd and extension. 10/26 hip flexion iso, bridge    Consulted and Agree with Plan of Care Patient             Patient will benefit from skilled therapeutic intervention in order to improve the following deficits and impairments:  Abnormal gait, Decreased activity tolerance, Decreased mobility, Decreased range of motion, Decreased strength, Difficulty walking, Improper body mechanics, Pain  Visit Diagnosis: Acute pain of right knee  Other abnormalities of gait and mobility  Other symptoms and signs involving the musculoskeletal system     Problem List Patient  Active Problem List   Diagnosis Date Noted   Amenorrhea 05/01/2020   Plantar fasciitis 09/01/2017   History of ovarian cyst 03/23/2015   History of abnormal cervical Pap smear 03/23/2015   Fibromyalgia 03/23/2015  Hypertension, essential 10/05/2014   Vitiligo 10/05/2014   5:56 PM, 01/17/21 Josue Hector PT DPT  Physical Therapist with [facility]  [phone] [place] [address] [place], [place], [address] Phone: [phone]   Fax:  [phone]  Name: Janaya Broy MRN: [id number] Date of Birth: [birth year]

## 2021-01-19 ENCOUNTER — Encounter (HOSPITAL_COMMUNITY): Payer: Self-pay

## 2021-01-19 ENCOUNTER — Ambulatory Visit (HOSPITAL_COMMUNITY): Payer: PRIVATE HEALTH INSURANCE

## 2021-01-19 ENCOUNTER — Other Ambulatory Visit: Payer: Self-pay

## 2021-01-19 DIAGNOSIS — M25561 Pain in right knee: Secondary | ICD-10-CM

## 2021-01-19 DIAGNOSIS — R29898 Other symptoms and signs involving the musculoskeletal system: Secondary | ICD-10-CM

## 2021-01-19 DIAGNOSIS — R2689 Other abnormalities of gait and mobility: Secondary | ICD-10-CM

## 2021-01-19 NOTE — Therapy (Signed)
[place], [place], [address] Phone: [phone]   Fax:  [phone]  Physical Therapy Treatment  Patient Details  Name: Virginia Hawkins MRN: [id number] Date of Birth: 1970/06/09 Referring Provider (PT): Edmonia Lynch, MD   Encounter Date: 01/19/2021   PT End of Session - 01/19/21 1123     Visit Number 6    Number of Visits 9    Date for PT Re-Evaluation 01/31/21    Authorization Type [facility]--This is WC (Claim# [id number]) approved for 1 eval and 8 tx (fax all progress notes to [phone])    Progress Note Due on Visit 9    PT Start Time 1116    PT Stop Time 4332   pt ended session due to urgent phone call   PT Time Calculation (min) 15 min    Activity Tolerance Patient limited by pain    Behavior During Therapy [facility] for tasks assessed/performed             Past Medical History:  Diagnosis Date   Fibromyalgia    Hypertension    Ovarian cyst    Vaginal Pap smear, abnormal     Past Surgical History:  Procedure Laterality Date   CYST EXCISION     FOOT SURGERY Left 2020   for plantar fasciitis   SALPINGECTOMY      There were no vitals filed for this visit.   Subjective Assessment - 01/19/21 1121     Subjective Pt reports increased discomfort in RLE as she is now doing more at her workplace (5 hours up, 5 hours light duty). Patient rates current pain at 20/10 for her RLE    How long can you sit comfortably? aching with prolonged sitting    Patient Stated Goals To reduce pain.    Currently in Pain? Yes    Pain Score 10-Worst pain ever   pt reports 20/10 pain. Discussion with pt regarding pain rating and if rating is this significant recommend visit to MD or ER   Pain Location Knee   right thigh   Pain Orientation Right;Anterior    Pain Descriptors / Indicators Aching    Pain Type Acute pain    Pain Radiating Towards right groin/thigh    Pain Onset 1 to 4 weeks ago    Pain  Score 10    Pain Location Groin    Pain Orientation Right    Pain Descriptors / Indicators Aching                [facility] PT Assessment - 01/19/21 0001       Assessment   Medical Diagnosis rt anterior knee pain    Referring Provider (PT) Edmonia Lynch, MD                           [facility] Adult PT Treatment/Exercise - 01/19/21 0001       Knee/Hip Exercises: Aerobic   Nustep level 3 x 6 min for dynamic warmup                       PT Short Term Goals - 01/03/21 1756       PT SHORT TERM GOAL #1   Title Patient will be IND with initial HEP to improve functional outcomes    Time 2    Period Weeks    Status New    Target Date 01/17/21  PT SHORT TERM GOAL #2   Title Patient will report at least 25% improvement in symptoms for improved quality of life.    Baseline 8/10 right knee pain, 9/10 right hip/groin pain    Time 2    Period Weeks    Status New    Target Date 01/17/21      PT SHORT TERM GOAL #3   Title Demo right hip flexion to 120 degrees to improve functional mobility    Baseline 102 degrees right hip flexion    Time 2    Period Weeks    Status New    Target Date 01/17/21               PT Long Term Goals - 01/03/21 1757       PT LONG TERM GOAL #1   Title Ambulate with normalized gait pattern over various surfaces x 6 minutes without increase in pain    Baseline antalgic, 8-9/10 RLE pain    Time 4    Period Weeks    Status New    Target Date 01/31/21      PT LONG TERM GOAL #2   Title Manifest improved RLE strength and activity tolerance as evidenced by reciprocal stair ambulation    Baseline step-to    Time 4    Period Weeks    Status New    Target Date 01/31/21      PT LONG TERM GOAL #3   Title Patient will report at least 75% improvement in symptoms for improved quality of life.    Time 4    Period Weeks    Status New    Target Date 01/31/21                   Plan - 01/19/21 1158      Clinical Impression Statement Tx session limited due to pt needing to take phone call.  No change in symptoms with RLE.  Pt reports "20/10" RLE pain.  Discussed at length regarding her symptoms and if pain is so severe and limiting recommend f/u with MD or ER visit due to "20/10 RLE pain"    Personal Factors and Comorbidities Time since onset of injury/illness/exacerbation;Comorbidity 1    Comorbidities pt notes past knee injuries and orthopedic issues    Examination-Activity Limitations Bend;Carry;Lift;Stand;Stairs;Squat;Sleep;Locomotion Level;Transfers    Examination-Participation Restrictions Cleaning;Community Activity;Driving;Occupation;Meal Prep    Stability/Clinical Decision Making Stable/Uncomplicated    Rehab Potential Good    PT Frequency 2x / week    PT Duration 4 weeks    PT Treatment/Interventions ADLs/Self Care Home Management;Aquatic Therapy;Electrical Stimulation;DME Instruction;Traction;Moist Solicitor;Therapeutic activities;Therapeutic exercise;Balance training;Neuromuscular re-education;Patient/family education;Passive range of motion;Dry needling;Taping;Spinal Manipulations;Joint Manipulations;Manual techniques    PT Next Visit Plan progress to open chain strength for knee, continue with hip assessment and activity as tolerated    PT Home Exercise Plan QS, hip add/abd isometric; 10/14: butterfly, FABER stretch, hip abd and extension. 10/26 hip flexion iso, bridge    Consulted and Agree with Plan of Care Patient             Patient will benefit from skilled therapeutic intervention in order to improve the following deficits and impairments:  Abnormal gait, Decreased activity tolerance, Decreased mobility, Decreased range of motion, Decreased strength, Difficulty walking, Improper body mechanics, Pain  Visit Diagnosis: Acute pain of right knee  Other abnormalities of gait and mobility  Other symptoms and signs involving the musculoskeletal  system     Problem List  Patient Active Problem List   Diagnosis Date Noted   Amenorrhea 05/01/2020   Plantar fasciitis 09/01/2017   History of ovarian cyst 03/23/2015   History of abnormal cervical Pap smear 03/23/2015   Fibromyalgia 03/23/2015   Hypertension, essential 10/05/2014   Vitiligo 10/05/2014    Toniann Fail, PT 01/19/2021, 12:00 PM  [place] [address] [place], [place], [address] Phone: [phone]   Fax:  [phone]  Name: Virginia Hawkins MRN: [id number] Date of Birth: 1970-11-27

## 2021-01-22 ENCOUNTER — Other Ambulatory Visit (HOSPITAL_COMMUNITY): Payer: Self-pay

## 2021-01-22 MED ORDER — MELOXICAM 15 MG PO TABS
15.0000 mg | ORAL_TABLET | Freq: Every day | ORAL | 0 refills | Status: DC | PRN
  Filled 2021-01-22: qty 30, 30d supply, fill #0

## 2021-01-23 ENCOUNTER — Other Ambulatory Visit: Payer: Self-pay

## 2021-01-23 ENCOUNTER — Encounter (HOSPITAL_COMMUNITY): Payer: Self-pay

## 2021-01-23 ENCOUNTER — Ambulatory Visit (HOSPITAL_COMMUNITY): Payer: PRIVATE HEALTH INSURANCE | Attending: Orthopedic Surgery

## 2021-01-23 DIAGNOSIS — R2689 Other abnormalities of gait and mobility: Secondary | ICD-10-CM | POA: Insufficient documentation

## 2021-01-23 DIAGNOSIS — M25561 Pain in right knee: Secondary | ICD-10-CM | POA: Insufficient documentation

## 2021-01-23 DIAGNOSIS — R29898 Other symptoms and signs involving the musculoskeletal system: Secondary | ICD-10-CM | POA: Diagnosis present

## 2021-01-23 NOTE — Therapy (Signed)
[place], [place], [address] Phone: [phone]   Fax:  [phone]  Physical Therapy Treatment  Patient Details  Name: Virginia Hawkins MRN: [id number] Date of Birth: 1970-12-16 Referring Provider (PT): Edmonia Lynch, MD   Encounter Date: 01/23/2021   PT End of Session - 01/23/21 1624     Visit Number 7    Number of Visits 9    Date for PT Re-Evaluation 01/31/21    Authorization Type [facility]--This is WC (Claim# [id number]) approved for 1 eval and 8 tx (fax all progress notes to [phone])    Progress Note Due on Visit 9    PT Start Time 1624   restroom break at beginnning of session   PT Stop Time 1657    PT Time Calculation (min) 33 min    Activity Tolerance Patient limited by pain    Behavior During Therapy [facility] for tasks assessed/performed             Past Medical History:  Diagnosis Date   Fibromyalgia    Hypertension    Ovarian cyst    Vaginal Pap smear, abnormal     Past Surgical History:  Procedure Laterality Date   CYST EXCISION     FOOT SURGERY Left 2020   for plantar fasciitis   SALPINGECTOMY      There were no vitals filed for this visit.   Subjective Assessment - 01/23/21 1625     Subjective Pt reports she continues to have shooting pain Rt thigh and knee, pain scale 8/10.  Has [employer] scheduled for thie Monday 01/29/21.  Reports she has returned to sitting majority of work due to increased pain standing 5hrs then sitting for 5hrs.    Patient Stated Goals To reduce pain.    Currently in Pain? Yes    Pain Score 8     Pain Location Knee   Knee and groin   Pain Orientation Right;Anterior    Pain Descriptors / Indicators Aching;Shooting    Pain Type Acute pain    Pain Radiating Towards right groin/thigh    Pain Onset 1 to 4 weeks ago    Pain Frequency Constant    Aggravating Factors  walking, stair, bending, squatting    Pain Relieving Factors heat pad,  elevation and rubbing                               [facility] Adult PT Treatment/Exercise - 01/23/21 0001       Knee/Hip Exercises: Stretches   Sports administrator Right;30 seconds      Knee/Hip Exercises: Aerobic   Nustep level 3 x 5  min for dynamic warmup      Knee/Hip Exercises: Standing   Heel Raises Both;10 reps;3 seconds    Knee Flexion Right;10 reps      Knee/Hip Exercises: Supine   Short Arc Quad Sets 2 sets;10 reps    Bridges 2 sets;10 reps    Bridges Limitations 5" holds    Knee Flexion Limitations 90 degrees  comfortably      Knee/Hip Exercises: Sidelying   Hip ABduction Right;2 sets;10 reps      Knee/Hip Exercises: Prone   Hip Extension Both;5 reps                       PT Short Term Goals - 01/03/21 1756       PT SHORT  TERM GOAL #1   Title Patient will be IND with initial HEP to improve functional outcomes    Time 2    Period Weeks    Status New    Target Date 01/17/21      PT SHORT TERM GOAL #2   Title Patient will report at least 25% improvement in symptoms for improved quality of life.    Baseline 8/10 right knee pain, 9/10 right hip/groin pain    Time 2    Period Weeks    Status New    Target Date 01/17/21      PT SHORT TERM GOAL #3   Title Demo right hip flexion to 120 degrees to improve functional mobility    Baseline 102 degrees right hip flexion    Time 2    Period Weeks    Status New    Target Date 01/17/21               PT Long Term Goals - 01/03/21 1757       PT LONG TERM GOAL #1   Title Ambulate with normalized gait pattern over various surfaces x 6 minutes without increase in pain    Baseline antalgic, 8-9/10 RLE pain    Time 4    Period Weeks    Status New    Target Date 01/31/21      PT LONG TERM GOAL #2   Title Manifest improved RLE strength and activity tolerance as evidenced by reciprocal stair ambulation    Baseline step-to    Time 4    Period Weeks    Status New    Target Date  01/31/21      PT LONG TERM GOAL #3   Title Patient will report at least 75% improvement in symptoms for improved quality of life.    Time 4    Period Weeks    Status New    Target Date 01/31/21                   Plan - 01/23/21 1646     Clinical Impression Statement Pt limited by pain through session with medial knee and groin pain.  Comfortable at 90 degrees flexion based activities vs full extension based exercises.  Able to progress to standing exercises for quad initiaion and hamstring strengthening.  Pt c/o groin and anterior quad pain through session, no reports of increased pain through session.    Personal Factors and Comorbidities Time since onset of injury/illness/exacerbation;Comorbidity 1    Comorbidities pt notes past knee injuries and orthopedic issues    Examination-Activity Limitations Bend;Carry;Lift;Stand;Stairs;Squat;Sleep;Locomotion Level;Transfers    Examination-Participation Restrictions Cleaning;Community Activity;Driving;Occupation;Meal Prep    Stability/Clinical Decision Making Stable/Uncomplicated    Clinical Decision Making Low    Rehab Potential Good    PT Frequency 2x / week    PT Duration 4 weeks    PT Treatment/Interventions ADLs/Self Care Home Management;Aquatic Therapy;Electrical Stimulation;DME Instruction;Traction;Moist Solicitor;Therapeutic activities;Therapeutic exercise;Balance training;Neuromuscular re-education;Patient/family education;Passive range of motion;Dry needling;Taping;Spinal Manipulations;Joint Manipulations;Manual techniques    PT Next Visit Plan F/U wiht additional closed chain exercises, PN prior next session prior MD apt 01/29/21.  progress to open chain strength for knee, continue with hip assessment and activity as tolerated    PT Home Exercise Plan QS, hip add/abd isometric; 10/14: butterfly, FABER stretch, hip abd and extension. 10/26 hip flexion iso, bridge    Consulted and Agree with Plan of Care  Patient  Patient will benefit from skilled therapeutic intervention in order to improve the following deficits and impairments:  Abnormal gait, Decreased activity tolerance, Decreased mobility, Decreased range of motion, Decreased strength, Difficulty walking, Improper body mechanics, Pain  Visit Diagnosis: Acute pain of right knee  Other abnormalities of gait and mobility  Other symptoms and signs involving the musculoskeletal system     Problem List Patient Active Problem List   Diagnosis Date Noted   Amenorrhea 05/01/2020   Plantar fasciitis 09/01/2017   History of ovarian cyst 03/23/2015   History of abnormal cervical Pap smear 03/23/2015   Fibromyalgia 03/23/2015   Hypertension, essential 10/05/2014   Vitiligo 10/05/2014   Ihor Austin, LPTA/CLT; CBIS [phone]  Aldona Lento, PTA 01/23/2021, 5:23 PM  [place], [place], [address] Phone: [phone]   Fax:  [phone]  Name: Virginia Hawkins MRN: [id number] Date of Birth: [birth year]

## 2021-01-26 ENCOUNTER — Other Ambulatory Visit (HOSPITAL_COMMUNITY): Payer: Self-pay

## 2021-01-26 ENCOUNTER — Other Ambulatory Visit: Payer: Self-pay

## 2021-01-26 ENCOUNTER — Ambulatory Visit (HOSPITAL_COMMUNITY): Payer: PRIVATE HEALTH INSURANCE | Attending: Orthopedic Surgery

## 2021-01-26 DIAGNOSIS — R2689 Other abnormalities of gait and mobility: Secondary | ICD-10-CM | POA: Diagnosis present

## 2021-01-26 DIAGNOSIS — R29898 Other symptoms and signs involving the musculoskeletal system: Secondary | ICD-10-CM

## 2021-01-26 DIAGNOSIS — M25561 Pain in right knee: Secondary | ICD-10-CM | POA: Diagnosis present

## 2021-01-26 DIAGNOSIS — M79604 Pain in right leg: Secondary | ICD-10-CM | POA: Diagnosis not present

## 2021-01-26 DIAGNOSIS — I10 Essential (primary) hypertension: Secondary | ICD-10-CM | POA: Diagnosis not present

## 2021-01-26 MED ORDER — LOSARTAN POTASSIUM 25 MG PO TABS
25.0000 mg | ORAL_TABLET | Freq: Every day | ORAL | 1 refills | Status: DC
  Filled 2021-01-26: qty 90, 90d supply, fill #0
  Filled 2021-04-22: qty 90, 90d supply, fill #1

## 2021-01-26 MED ORDER — GABAPENTIN 100 MG PO CAPS
100.0000 mg | ORAL_CAPSULE | Freq: Three times a day (TID) | ORAL | 0 refills | Status: DC | PRN
  Filled 2021-01-26: qty 90, 30d supply, fill #0

## 2021-01-26 NOTE — Therapy (Signed)
[facility] [place], [place], [address] Phone: [phone]   Fax:  [phone]  Physical Therapy Treatment  Patient Details  Name: Virginia Hawkins MRN: [id number] Date of Birth: [birth year] Referring Provider (PT): Edmonia Lynch, MD   Encounter Date: 01/26/2021   PT End of Session - 01/26/21 1445     Visit Number 8    Number of Visits 9    Date for PT Re-Evaluation 01/31/21    Authorization Type [employer]--This is WC (Claim# [id number]) approved for 1 eval and 8 tx (fax all progress notes to [phone])    Progress Note Due on Visit 9    PT Start Time 1445   late arrival   PT Stop Time 1515    PT Time Calculation (min) 30 min    Activity Tolerance Patient limited by pain    Behavior During Therapy [facility] for tasks assessed/performed             Past Medical History:  Diagnosis Date   Fibromyalgia    Hypertension    Ovarian cyst    Vaginal Pap smear, abnormal     Past Surgical History:  Procedure Laterality Date   CYST EXCISION     FOOT SURGERY Left 2020   for plantar fasciitis   SALPINGECTOMY      There were no vitals filed for this visit.   Subjective Assessment - 01/26/21 1448     Subjective Pt reports she will have ultrasound of her knee on Monday 01/29/21 and will have an MRI scheduled to be determined. Pt notes no change in symptoms and has been returned to light duty at work due to ongoing symptoms    Limitations Sitting;Lifting;Standing;Walking;House hold activities    How long can you sit comfortably? aching with prolonged sitting    How long can you stand comfortably? 15 min    How long can you walk comfortably? less than 15 minutes    Patient Stated Goals To reduce pain.    Currently in Pain? Yes    Pain Score 8     Pain Location Knee    Pain Orientation Right;Anterior    Pain Descriptors / Indicators Aching    Pain Type Acute pain    Pain Radiating Towards right  groin/thigh    Pain Onset 1 to 4 weeks ago    Pain Score 8    Pain Location Groin    Pain Orientation Right    Pain Descriptors / Indicators Aching                [facility] PT Assessment - 01/26/21 0001       Assessment   Medical Diagnosis rt anterior knee pain    Referring Provider (PT) Edmonia Lynch, MD    Hand Dominance Right    Next MD Visit 01/29/21      AROM   Right/Left Hip Right    Right Hip Extension 11    Right Hip Flexion 120    Right Hip Internal Rotation  35    Right Hip ABduction 35    Right/Left Knee Right    Right Knee Extension 0    Right Knee Flexion 125      Ambulation/Gait   Ambulation/Gait Yes    Ambulation/Gait Assistance 7: Independent    Assistive device None    Gait Pattern Within Functional Limits  [place] Adult PT Treatment/Exercise - 01/26/21 0001       Knee/Hip Exercises: Supine   Quad Sets Strengthening;Right;2 sets;10 reps    Short Arc Quad Sets 2 sets;10 reps    Bridges 2 sets;10 reps    Other Supine Knee/Hip Exercises ball squeeze 2x10      Knee/Hip Exercises: Sidelying   Hip ABduction Strengthening;Right;2 sets;10 reps                       PT Short Term Goals - 01/26/21 1501       PT SHORT TERM GOAL #1   Title Patient will be IND with initial HEP to improve functional outcomes    Time 2    Period Weeks    Status On-going    Target Date 01/17/21      PT SHORT TERM GOAL #2   Title Patient will report at least 25% improvement in symptoms for improved quality of life.    Baseline 8/10 right knee pain, 9/10 right hip/groin pain    Time 2    Period Weeks    Status On-going    Target Date 01/17/21      PT SHORT TERM GOAL #3   Title Demo right hip flexion to 120 degrees to improve functional mobility    Baseline 120    Time 2    Period Weeks    Status Achieved    Target Date 01/17/21               PT Long Term Goals - 01/03/21 1757       PT LONG TERM  GOAL #1   Title Ambulate with normalized gait pattern over various surfaces x 6 minutes without increase in pain    Baseline antalgic, 8-9/10 RLE pain    Time 4    Period Weeks    Status New    Target Date 01/31/21      PT LONG TERM GOAL #2   Title Manifest improved RLE strength and activity tolerance as evidenced by reciprocal stair ambulation    Baseline step-to    Time 4    Period Weeks    Status New    Target Date 01/31/21      PT LONG TERM GOAL #3   Title Patient will report at least 75% improvement in symptoms for improved quality of life.    Time 4    Period Weeks    Status New    Target Date 01/31/21                   Plan - 01/26/21 1519     Clinical Impression Statement Continues to be limited by c/o knee and right groin pain.  Demonstrating good ROM with full AROM appreciated at right knee and hip. Some crepitus at right patellofemoral noted with open chain flexion-extension but not a primary cause of pain per patient.  Pt reports limited improvement and notes no change in symptoms.  Patient reports she will have sonography to right knee on 01/29/21 and reports she has initiated obtaining an MRI for her RLE as well. Continue x 1 visit for D/C assessment and applicable referal for next level of care if needed    Personal Factors and Comorbidities Time since onset of injury/illness/exacerbation;Comorbidity 1    Comorbidities pt notes past knee injuries and orthopedic issues    Examination-Activity Limitations Bend;Carry;Lift;Stand;Stairs;Squat;Sleep;Locomotion Level;Transfers    Examination-Participation Restrictions Cleaning;Community Activity;Driving;Occupation;Meal Prep    Stability/Clinical Decision  Making Stable/Uncomplicated    Rehab Potential Good    PT Frequency 2x / week    PT Duration 4 weeks    PT Treatment/Interventions ADLs/Self Care Home Management;Aquatic Therapy;Electrical Stimulation;DME Instruction;Traction;Moist Furniture conservator/restorer;Therapeutic activities;Therapeutic exercise;Balance training;Neuromuscular re-education;Patient/family education;Passive range of motion;Dry needling;Taping;Spinal Manipulations;Joint Manipulations;Manual techniques    PT Next Visit Plan F/U wiht additional closed chain exercises, PN prior next session prior MD apt 01/29/21.  progress to open chain strength for knee, continue with hip assessment and activity as tolerated    PT Home Exercise Plan QS, hip add/abd isometric; 10/14: butterfly, FABER stretch, hip abd and extension. 10/26 hip flexion iso, bridge    Consulted and Agree with Plan of Care Patient             Patient will benefit from skilled therapeutic intervention in order to improve the following deficits and impairments:  Abnormal gait, Decreased activity tolerance, Decreased mobility, Decreased range of motion, Decreased strength, Difficulty walking, Improper body mechanics, Pain  Visit Diagnosis: Acute pain of right knee  Other abnormalities of gait and mobility  Other symptoms and signs involving the musculoskeletal system     Problem List Patient Active Problem List   Diagnosis Date Noted   Amenorrhea 05/01/2020   Plantar fasciitis 09/01/2017   History of ovarian cyst 03/23/2015   History of abnormal cervical Pap smear 03/23/2015   Fibromyalgia 03/23/2015   Hypertension, essential 10/05/2014   Vitiligo 10/05/2014    Toniann Fail, PT 01/26/2021, 3:26 PM  [facility] [address] [place], [place], [address] Phone: [phone]   Fax:  [phone]  Name: Virginia Hawkins MRN: [id number] Date of Birth: May 14, 1970

## 2021-01-30 ENCOUNTER — Ambulatory Visit (HOSPITAL_COMMUNITY): Payer: PRIVATE HEALTH INSURANCE

## 2021-01-30 ENCOUNTER — Other Ambulatory Visit: Payer: Self-pay

## 2021-01-30 ENCOUNTER — Encounter (HOSPITAL_COMMUNITY): Payer: Self-pay

## 2021-01-30 DIAGNOSIS — M25561 Pain in right knee: Secondary | ICD-10-CM | POA: Diagnosis not present

## 2021-01-30 DIAGNOSIS — R29898 Other symptoms and signs involving the musculoskeletal system: Secondary | ICD-10-CM

## 2021-01-30 DIAGNOSIS — R2689 Other abnormalities of gait and mobility: Secondary | ICD-10-CM

## 2021-01-30 NOTE — Therapy (Signed)
[facility] [address] [place], [place], [address] Phone: [phone]   Fax:  [phone]  Physical Therapy Treatment and Progress Note  Patient Details  Name: Virginia Hawkins MRN: [id number] Date of Birth: 05-Mar-1971 Referring Provider (PT): Edmonia Lynch, MD  Progress Note Reporting Period 01/03/21 to 01/30/21  See note below for Objective Data and Assessment of Progress/Goals.     Encounter Date: 01/30/2021   PT End of Session - 01/30/21 1609     Visit Number 9    Number of Visits 15    Date for PT Re-Evaluation 02/27/21    Authorization Type [facility]--This is WC (Claim# [id number]) approved for 1 eval and 8 tx (fax all progress notes to [phone])    Progress Note Due on Visit 19    PT Start Time 1609    PT Stop Time 1645    PT Time Calculation (min) 36 min    Activity Tolerance Patient limited by pain    Behavior During Therapy [facility] for tasks assessed/performed             Past Medical History:  Diagnosis Date   Fibromyalgia    Hypertension    Ovarian cyst    Vaginal Pap smear, abnormal     Past Surgical History:  Procedure Laterality Date   CYST EXCISION     FOOT SURGERY Left 2020   for plantar fasciitis   SALPINGECTOMY      There were no vitals filed for this visit.   Subjective Assessment - 01/30/21 1617     Subjective Pt reports no change in her symptoms. Pt had sonography performed on right knee but she is unsure of the results.  Pt reports that worker's comp doctor's will not examine her right hip/groin    Limitations Sitting;Lifting;Standing;Walking;House hold activities    How long can you sit comfortably? aching with prolonged sitting    How long can you stand comfortably? 15 min    How long can you walk comfortably? less than 15 minutes    Patient Stated Goals To reduce pain.    Currently in Pain? Yes    Pain Score 8     Pain Location Knee    Pain Orientation  Right;Anterior    Pain Descriptors / Indicators Aching    Pain Type Acute pain    Pain Onset 1 to 4 weeks ago    Multiple Pain Sites Yes    Pain Score 8    Pain Location Groin    Pain Orientation Right    Pain Descriptors / Indicators Aching    Pain Type Acute pain    Pain Frequency Constant                [facility] PT Assessment - 01/30/21 0001       Assessment   Medical Diagnosis rt anterior knee pain    Referring Provider (PT) Edmonia Lynch, MD    Hand Dominance Right    Next MD Visit 02/2021                           [facility] Adult PT Treatment/Exercise - 01/30/21 0001       Ambulation/Gait   Ambulation/Gait Yes    Ambulation/Gait Assistance 7: Independent    Ambulation Distance (Feet) 226 Feet    Assistive device None    Gait Pattern Decreased stride length    Stairs Yes    Stairs Assistance 6:  Modified independent (Device/Increase time)    Stair Management Technique One rail Left;Alternating pattern    Gait Comments 2MWT      Knee/Hip Exercises: Supine   Quad Sets Strengthening;Right;2 sets;10 reps    Short Arc Quad Sets 2 sets;10 reps    Bridges 2 sets;10 reps    Other Supine Knee/Hip Exercises ball squeeze 2x10      Knee/Hip Exercises: Sidelying   Hip ABduction Strengthening;Right;2 sets;10 reps                       PT Short Term Goals - 01/30/21 1627       PT SHORT TERM GOAL #1   Title Patient will be IND with initial HEP to improve functional outcomes    Baseline Independent in open chain PRE and static stretching routine for RLE    Time 2    Period Weeks    Status Achieved    Target Date 01/17/21      PT SHORT TERM GOAL #2   Title Patient will report at least 25% improvement in symptoms for improved quality of life.    Baseline 10% improvement. Continues to report 8/10 pain in RLE/groin    Time 2    Period Weeks    Status On-going    Target Date 01/17/21      PT SHORT TERM GOAL #3   Title Demo right hip  flexion to 120 degrees to improve functional mobility    Baseline 120    Time 2    Period Weeks    Status Achieved    Target Date 01/17/21               PT Long Term Goals - 01/30/21 1639       PT LONG TERM GOAL #1   Title Ambulate with normalized gait pattern over various surfaces x 6 minutes without increase in pain    Baseline antalgic, 8-9/10 RLE pain    Time 5    Period Weeks    Status On-going    Target Date 03/06/21      PT LONG TERM GOAL #2   Title Manifest improved RLE strength and activity tolerance as evidenced by reciprocal stair ambulation    Baseline reciprocal pattern with HR, persistent right knee pain    Time 5    Period Weeks    Status On-going    Target Date 03/06/21      PT LONG TERM GOAL #3   Title Patient will report at least 75% improvement in symptoms for improved quality of life.    Baseline 10% per patient report    Time 4    Period Weeks    Status On-going    Target Date 03/06/21                   Plan - 01/30/21 1643     Clinical Impression Statement Pt reports continued 8-9/10 RLE pain without much relief and report of 10% improvement overall since beginning therapy sessions. Ambulates with decreased stride length due to right knee/groin pain. Demo improved ROM overall but unfortunately not much amelioration of leg pain and dysfunction.  Overall, poor exercise and activity tolerance due to right LE pain. This therapist is unsure as to the etiology of symptoms and recommend further assessment and/or imaging by provider for differential diagnosis. Will continue with sessions per Worker's Comp and physician request    Personal Factors and Comorbidities Time since onset of injury/illness/exacerbation;Comorbidity  1    Comorbidities pt notes past knee injuries and orthopedic issues    Examination-Activity Limitations Bend;Carry;Lift;Stand;Stairs;Squat;Sleep;Locomotion Level;Transfers    Examination-Participation Restrictions  Cleaning;Community Activity;Driving;Occupation;Meal Prep    Stability/Clinical Decision Making Stable/Uncomplicated    Rehab Potential Good    PT Frequency 2x / week    PT Duration 4 weeks    PT Treatment/Interventions ADLs/Self Care Home Management;Aquatic Therapy;Electrical Stimulation;DME Instruction;Traction;Moist Solicitor;Therapeutic activities;Therapeutic exercise;Balance training;Neuromuscular re-education;Patient/family education;Passive range of motion;Dry needling;Taping;Spinal Manipulations;Joint Manipulations;Manual techniques    PT Next Visit Plan F/U wiht additional closed chain exercises, PN prior next session prior MD apt 01/29/21.  progress to open chain strength for knee, continue with hip assessment and activity as tolerated    PT Home Exercise Plan QS, hip add/abd isometric; 10/14: butterfly, FABER stretch, hip abd and extension. 10/26 hip flexion iso, bridge    Consulted and Agree with Plan of Care Patient             Patient will benefit from skilled therapeutic intervention in order to improve the following deficits and impairments:  Abnormal gait, Decreased activity tolerance, Decreased mobility, Decreased range of motion, Decreased strength, Difficulty walking, Improper body mechanics, Pain  Visit Diagnosis: Acute pain of right knee  Other abnormalities of gait and mobility  Other symptoms and signs involving the musculoskeletal system     Problem List Patient Active Problem List   Diagnosis Date Noted   Amenorrhea 05/01/2020   Plantar fasciitis 09/01/2017   History of ovarian cyst 03/23/2015   History of abnormal cervical Pap smear 03/23/2015   Fibromyalgia 03/23/2015   Hypertension, essential 10/05/2014   Vitiligo 10/05/2014    Toniann Fail, PT 01/30/2021, 4:48 PM  [facility] [address] [place], [place], [address] Phone: [phone]   Fax:  [phone]  Name: Virginia Hawkins MRN: [id number] Date of Birth: June 11, 1970

## 2021-02-02 ENCOUNTER — Encounter (HOSPITAL_COMMUNITY): Payer: 59

## 2021-02-06 ENCOUNTER — Encounter (HOSPITAL_COMMUNITY): Payer: Self-pay | Admitting: Physical Therapy

## 2021-02-06 ENCOUNTER — Other Ambulatory Visit: Payer: Self-pay

## 2021-02-06 ENCOUNTER — Ambulatory Visit (HOSPITAL_COMMUNITY): Payer: PRIVATE HEALTH INSURANCE | Attending: Orthopedic Surgery | Admitting: Physical Therapy

## 2021-02-06 DIAGNOSIS — R29898 Other symptoms and signs involving the musculoskeletal system: Secondary | ICD-10-CM | POA: Insufficient documentation

## 2021-02-06 DIAGNOSIS — R2689 Other abnormalities of gait and mobility: Secondary | ICD-10-CM | POA: Diagnosis present

## 2021-02-06 DIAGNOSIS — M25561 Pain in right knee: Secondary | ICD-10-CM | POA: Insufficient documentation

## 2021-02-06 NOTE — Therapy (Signed)
[place], [place], [address] Phone: [phone]   Fax:  [phone]  Physical Therapy Treatment  Patient Details  Name: Virginia Hawkins MRN: [id number] Date of Birth: [birth year] Referring Provider (PT): Edmonia Lynch, MD   Encounter Date: 02/06/2021   PT End of Session - 02/06/21 1532     Visit Number 10    Number of Visits 15    Date for PT Re-Evaluation 02/27/21    Authorization Type [facility]--This is WC (Claim# [id number]) approved for 1 eval and 8 tx, and then approved 6 visits that have to be used by 02/22/21 (fax all progress notes to [phone])    Authorization - Visit Number 1    Authorization - Number of Visits 6    Progress Note Due on Visit 19    PT Start Time 1532    PT Stop Time 1603    PT Time Calculation (min) 31 min    Activity Tolerance Patient limited by pain    Behavior During Therapy [facility] for tasks assessed/performed             Past Medical History:  Diagnosis Date   Fibromyalgia    Hypertension    Ovarian cyst    Vaginal Pap smear, abnormal     Past Surgical History:  Procedure Laterality Date   CYST EXCISION     FOOT SURGERY Left 2020   for plantar fasciitis   SALPINGECTOMY      There were no vitals filed for this visit.   Subjective Assessment - 02/06/21 1536     Subjective States that she has an MRI of her leg on Thursday and has had increase in knee pain. States that her pain is 8/10 States that she sometimes feel more pain with her brace on. States she has backed off on the exercise as she doesn't want to hurt things.    Limitations Sitting;Lifting;Standing;Walking;House hold activities    How long can you sit comfortably? aching with prolonged sitting    How long can you stand comfortably? 15 min    How long can you walk comfortably? less than 15 minutes    Patient Stated Goals To reduce pain.    Currently in Pain? Yes    Pain Score 8      Pain Location Knee    Pain Orientation Right;Anterior    Pain Descriptors / Indicators Aching    Pain Onset 1 to 4 weeks ago                [facility] PT Assessment - 02/06/21 0001       Assessment   Medical Diagnosis rt anterior knee pain    Referring Provider (PT) Edmonia Lynch, MD    Next MD Visit 02/21/21                           [facility] Adult PT Treatment/Exercise - 02/06/21 0001       Knee/Hip Exercises: Seated   Long Arc Quad 10 reps;Right   10" holds   Other Seated Knee/Hip Exercises hip add and the abd isometrics x30 5" holds E      Modalities   Modalities Moist Heat      Moist Heat Therapy   Number Minutes Moist Heat 10 Minutes    Moist Heat Location Knee   right  PT Education - 02/06/21 1547     Education Details on current presentation, on HEP, on POC, on plan with interventions and schedule    Person(s) Educated Patient    Methods Explanation    Comprehension Verbalized understanding              PT Short Term Goals - 01/30/21 1627       PT SHORT TERM GOAL #1   Title Patient will be IND with initial HEP to improve functional outcomes    Baseline Independent in open chain PRE and static stretching routine for RLE    Time 2    Period Weeks    Status Achieved    Target Date 01/17/21      PT SHORT TERM GOAL #2   Title Patient will report at least 25% improvement in symptoms for improved quality of life.    Baseline 10% improvement. Continues to report 8/10 pain in RLE/groin    Time 2    Period Weeks    Status On-going    Target Date 01/17/21      PT SHORT TERM GOAL #3   Title Demo right hip flexion to 120 degrees to improve functional mobility    Baseline 120    Time 2    Period Weeks    Status Achieved    Target Date 01/17/21               PT Long Term Goals - 01/30/21 1639       PT LONG TERM GOAL #1   Title Ambulate with normalized gait pattern over various surfaces x 6 minutes  without increase in pain    Baseline antalgic, 8-9/10 RLE pain    Time 5    Period Weeks    Status On-going    Target Date 03/06/21      PT LONG TERM GOAL #2   Title Manifest improved RLE strength and activity tolerance as evidenced by reciprocal stair ambulation    Baseline reciprocal pattern with HR, persistent right knee pain    Time 5    Period Weeks    Status On-going    Target Date 03/06/21      PT LONG TERM GOAL #3   Title Patient will report at least 75% improvement in symptoms for improved quality of life.    Baseline 10% per patient report    Time 4    Period Weeks    Status On-going    Target Date 03/06/21                   Plan - 02/06/21 1538     Clinical Impression Statement Session limited secondary to pain and patient wanting to get MRI prior to adding in new interventions. MRI scheduled for this Thursday. Applied heat which helped reduce symptoms and patient tolerated interventions better.    Personal Factors and Comorbidities Time since onset of injury/illness/exacerbation;Comorbidity 1    Comorbidities pt notes past knee injuries and orthopedic issues    Examination-Activity Limitations Bend;Carry;Lift;Stand;Stairs;Squat;Sleep;Locomotion Level;Transfers    Examination-Participation Restrictions Cleaning;Community Activity;Driving;Occupation;Meal Prep    Stability/Clinical Decision Making Stable/Uncomplicated    Rehab Potential Good    PT Frequency 2x / week    PT Duration 4 weeks    PT Treatment/Interventions ADLs/Self Care Home Management;Aquatic Therapy;Electrical Stimulation;DME Instruction;Traction;Moist Solicitor;Therapeutic activities;Therapeutic exercise;Balance training;Neuromuscular re-education;Patient/family education;Passive range of motion;Dry needling;Taping;Spinal Manipulations;Joint Manipulations;Manual techniques    PT Next Visit Plan F/U wiht additional closed chain exercises, PN prior  next session prior MD apt  01/29/21.  progress to open chain strength for knee, continue with hip assessment and activity as tolerated    PT Home Exercise Plan QS, hip add/abd isometric; 10/14: butterfly, FABER stretch, hip abd and extension. 10/26 hip flexion iso, bridge    Consulted and Agree with Plan of Care Patient             Patient will benefit from skilled therapeutic intervention in order to improve the following deficits and impairments:  Abnormal gait, Decreased activity tolerance, Decreased mobility, Decreased range of motion, Decreased strength, Difficulty walking, Improper body mechanics, Pain  Visit Diagnosis: Acute pain of right knee  Other abnormalities of gait and mobility  Other symptoms and signs involving the musculoskeletal system     Problem List Patient Active Problem List   Diagnosis Date Noted   Amenorrhea 05/01/2020   Plantar fasciitis 09/01/2017   History of ovarian cyst 03/23/2015   History of abnormal cervical Pap smear 03/23/2015   Fibromyalgia 03/23/2015   Hypertension, essential 10/05/2014   Vitiligo 10/05/2014   4:11 PM, 02/06/21 Jerene Pitch, DPT Physical Therapy with [facility]  [phone] office   [place], [place], [address] Phone: [phone]   Fax:  [phone]  Name: Virginia Hawkins MRN: [id number] Date of Birth: Aug 18, 1970

## 2021-02-08 DIAGNOSIS — M79604 Pain in right leg: Secondary | ICD-10-CM | POA: Diagnosis not present

## 2021-02-09 ENCOUNTER — Other Ambulatory Visit: Payer: Self-pay

## 2021-02-09 ENCOUNTER — Ambulatory Visit (HOSPITAL_COMMUNITY): Payer: PRIVATE HEALTH INSURANCE

## 2021-02-09 DIAGNOSIS — M25561 Pain in right knee: Secondary | ICD-10-CM | POA: Diagnosis not present

## 2021-02-09 DIAGNOSIS — R29898 Other symptoms and signs involving the musculoskeletal system: Secondary | ICD-10-CM

## 2021-02-09 DIAGNOSIS — R2689 Other abnormalities of gait and mobility: Secondary | ICD-10-CM

## 2021-02-09 NOTE — Therapy (Signed)
[place], [place], [address] Phone: [phone]   Fax:  [phone]  Physical Therapy Treatment  Patient Details  Name: Virginia Hawkins MRN: [id number] Date of Birth: 1970/07/07 Referring Provider (PT): Edmonia Lynch, MD   Encounter Date: 02/09/2021   PT End of Session - 02/09/21 0834     Visit Number 11    Number of Visits 15    Date for PT Re-Evaluation 02/27/21    Authorization Type [employer]--This is WC (Claim# [id number]) approved for 1 eval and 8 tx, and then approved 6 visits that have to be used by 02/22/21 (fax all progress notes to [phone])    Authorization - Visit Number 2    Authorization - Number of Visits 6    Progress Note Due on Visit 31    PT Start Time 0831   late arrival   PT Stop Time 0900    PT Time Calculation (min) 29 min    Activity Tolerance Patient limited by pain    Behavior During Therapy [facility] for tasks assessed/performed             Past Medical History:  Diagnosis Date   Fibromyalgia    Hypertension    Ovarian cyst    Vaginal Pap smear, abnormal     Past Surgical History:  Procedure Laterality Date   CYST EXCISION     FOOT SURGERY Left 2020   for plantar fasciitis   SALPINGECTOMY      There were no vitals filed for this visit.   Subjective Assessment - 02/09/21 0836     Subjective Had MRI of right hip last night and will hopefully get results    Limitations Sitting;Lifting;Standing;Walking;House hold activities    How long can you sit comfortably? aching with prolonged sitting    How long can you stand comfortably? 15 min    How long can you walk comfortably? less than 15 minutes    Patient Stated Goals To reduce pain.    Currently in Pain? Yes    Pain Score 8     Pain Location Knee    Pain Orientation Right;Anterior    Pain Descriptors / Indicators Aching    Pain Onset 1 to 4 weeks ago    Pain Score 8    Pain Location Groin    Pain  Orientation Right    Pain Descriptors / Indicators Aching    Pain Type Acute pain                [facility] PT Assessment - 02/09/21 0001       Assessment   Medical Diagnosis rt anterior knee pain    Referring Provider (PT) Edmonia Lynch, MD    Next MD Visit 02/21/21                           [facility] Adult PT Treatment/Exercise - 02/09/21 0001       Knee/Hip Exercises: Stretches   Gastroc Stretch Right;3 reps;60 seconds      Knee/Hip Exercises: Standing   Rocker Board 2 minutes   lateral, ant-post   Other Standing Knee Exercises static standing airex pad 3x30 se eyes open/closed. 3x10 sec postural perturbations                       PT Short Term Goals - 01/30/21 1627       PT SHORT TERM GOAL #  1   Title Patient will be IND with initial HEP to improve functional outcomes    Baseline Independent in open chain PRE and static stretching routine for RLE    Time 2    Period Weeks    Status Achieved    Target Date 01/17/21      PT SHORT TERM GOAL #2   Title Patient will report at least 25% improvement in symptoms for improved quality of life.    Baseline 10% improvement. Continues to report 8/10 pain in RLE/groin    Time 2    Period Weeks    Status On-going    Target Date 01/17/21      PT SHORT TERM GOAL #3   Title Demo right hip flexion to 120 degrees to improve functional mobility    Baseline 120    Time 2    Period Weeks    Status Achieved    Target Date 01/17/21               PT Long Term Goals - 01/30/21 1639       PT LONG TERM GOAL #1   Title Ambulate with normalized gait pattern over various surfaces x 6 minutes without increase in pain    Baseline antalgic, 8-9/10 RLE pain    Time 5    Period Weeks    Status On-going    Target Date 03/06/21      PT LONG TERM GOAL #2   Title Manifest improved RLE strength and activity tolerance as evidenced by reciprocal stair ambulation    Baseline reciprocal pattern with HR,  persistent right knee pain    Time 5    Period Weeks    Status On-going    Target Date 03/06/21      PT LONG TERM GOAL #3   Title Patient will report at least 75% improvement in symptoms for improved quality of life.    Baseline 10% per patient report    Time 4    Period Weeks    Status On-going    Target Date 03/06/21                   Plan - 02/09/21 0857     Clinical Impression Statement Pt reports continued right knee and groin and painful ambulation. Tolerating static standing proximal stability activities fairly well.  Pt notes increased pain by end of day with onset of fatigue.    Personal Factors and Comorbidities Time since onset of injury/illness/exacerbation;Comorbidity 1    Comorbidities pt notes past knee injuries and orthopedic issues    Examination-Activity Limitations Bend;Carry;Lift;Stand;Stairs;Squat;Sleep;Locomotion Level;Transfers    Examination-Participation Restrictions Cleaning;Community Activity;Driving;Occupation;Meal Prep    Stability/Clinical Decision Making Stable/Uncomplicated    Rehab Potential Good    PT Frequency 2x / week    PT Duration 4 weeks    PT Treatment/Interventions ADLs/Self Care Home Management;Aquatic Therapy;Electrical Stimulation;DME Instruction;Traction;Moist Solicitor;Therapeutic activities;Therapeutic exercise;Balance training;Neuromuscular re-education;Patient/family education;Passive range of motion;Dry needling;Taping;Spinal Manipulations;Joint Manipulations;Manual techniques    PT Next Visit Plan F/U wiht additional closed chain exercises, PN prior next session prior MD apt 01/29/21.  progress to open chain strength for knee, continue with hip assessment and activity as tolerated    PT Home Exercise Plan QS, hip add/abd isometric; 10/14: butterfly, FABER stretch, hip abd and extension. 10/26 hip flexion iso, bridge    Consulted and Agree with Plan of Care Patient             Patient will benefit  from skilled therapeutic intervention in order to improve the following deficits and impairments:  Abnormal gait, Decreased activity tolerance, Decreased mobility, Decreased range of motion, Decreased strength, Difficulty walking, Improper body mechanics, Pain  Visit Diagnosis: Acute pain of right knee  Other abnormalities of gait and mobility  Other symptoms and signs involving the musculoskeletal system     Problem List Patient Active Problem List   Diagnosis Date Noted   Amenorrhea 05/01/2020   Plantar fasciitis 09/01/2017   History of ovarian cyst 03/23/2015   History of abnormal cervical Pap smear 03/23/2015   Fibromyalgia 03/23/2015   Hypertension, essential 10/05/2014   Vitiligo 10/05/2014    Toniann Fail, PT 02/09/2021, 9:00 AM  [place], [place], [address] Phone: [phone]   Fax:  [phone]  Name: Virginia Hawkins MRN: [id number] Date of Birth: March 07, 1971

## 2021-02-14 ENCOUNTER — Other Ambulatory Visit (HOSPITAL_COMMUNITY): Payer: Self-pay

## 2021-02-14 DIAGNOSIS — M25561 Pain in right knee: Secondary | ICD-10-CM | POA: Diagnosis not present

## 2021-02-14 DIAGNOSIS — M79604 Pain in right leg: Secondary | ICD-10-CM | POA: Diagnosis not present

## 2021-02-14 DIAGNOSIS — Z23 Encounter for immunization: Secondary | ICD-10-CM | POA: Diagnosis not present

## 2021-02-14 DIAGNOSIS — G8929 Other chronic pain: Secondary | ICD-10-CM | POA: Diagnosis not present

## 2021-02-14 MED ORDER — GABAPENTIN 300 MG PO CAPS
300.0000 mg | ORAL_CAPSULE | Freq: Three times a day (TID) | ORAL | 0 refills | Status: DC | PRN
  Filled 2021-02-14: qty 90, 30d supply, fill #0

## 2021-02-19 ENCOUNTER — Ambulatory Visit (HOSPITAL_COMMUNITY): Payer: PRIVATE HEALTH INSURANCE

## 2021-02-19 ENCOUNTER — Telehealth (HOSPITAL_COMMUNITY): Payer: Self-pay

## 2021-02-19 NOTE — Telephone Encounter (Signed)
No show, called and spoke to pt who stated she thought apt was scheduled for tomorrow.  Reminded next apt date and time with contact information included.  Ihor Austin, LPTA/CLT; Delana Meyer [phone]

## 2021-02-20 ENCOUNTER — Other Ambulatory Visit: Payer: Self-pay

## 2021-02-20 ENCOUNTER — Encounter (HOSPITAL_COMMUNITY): Payer: Self-pay | Admitting: Physical Therapy

## 2021-02-20 ENCOUNTER — Ambulatory Visit (HOSPITAL_COMMUNITY): Payer: PRIVATE HEALTH INSURANCE | Admitting: Physical Therapy

## 2021-02-20 DIAGNOSIS — M25561 Pain in right knee: Secondary | ICD-10-CM

## 2021-02-20 DIAGNOSIS — R2689 Other abnormalities of gait and mobility: Secondary | ICD-10-CM

## 2021-02-20 DIAGNOSIS — R29898 Other symptoms and signs involving the musculoskeletal system: Secondary | ICD-10-CM

## 2021-02-20 NOTE — Therapy (Signed)
[place], [place], [address] Phone: [phone]   Fax:  [phone]  Physical Therapy Treatment  Patient Details  Name: Halo Shevlin MRN: [id number] Date of Birth: [birth year] Referring Provider (PT): Edmonia Lynch, MD   Encounter Date: 02/20/2021   PT End of Session - 02/20/21 1658     Visit Number 12    Number of Visits 15    Date for PT Re-Evaluation 02/27/21    Authorization Type [employer]--This is WC (Claim# [id number]) approved for 1 eval and 8 tx, and then approved 6 visits that have to be used by 02/22/21 (fax all progress notes to [phone])    Authorization - Visit Number 3    Authorization - Number of Visits 6    Progress Note Due on Visit 31    PT Start Time 1615    PT Stop Time 1657    PT Time Calculation (min) 42 min    Activity Tolerance Patient limited by pain    Behavior During Therapy [facility] for tasks assessed/performed             Past Medical History:  Diagnosis Date   Fibromyalgia    Hypertension    Ovarian cyst    Vaginal Pap smear, abnormal     Past Surgical History:  Procedure Laterality Date   CYST EXCISION     FOOT SURGERY Left 2020   for plantar fasciitis   SALPINGECTOMY      There were no vitals filed for this visit.   Subjective Assessment - 02/20/21 1618     Subjective Pt states that she has some information due to the fact that she has more pain following therapy;  She goes to the MD tomorrow.    Limitations Sitting;Lifting;Standing;Walking;House hold activities    How long can you sit comfortably? aching with prolonged sitting    How long can you stand comfortably? 15 min    How long can you walk comfortably? less than 15 minutes    Patient Stated Goals To reduce pain.    Pain Onset 1 to 4 weeks ago                     [facility] Adult PT Treatment/Exercise - 02/20/21 0001       Exercises   Exercises Knee/Hip      Knee/Hip  Exercises: Aerobic   Nustep x2 minutes      Knee/Hip Exercises: Standing   Heel Raises Both;10 reps    Knee Flexion Both;10 reps    Knee Flexion Limitations 3#    Hip Abduction Stengthening;Both;10 reps    Hip Extension Both;5 reps    Forward Step Up Both;10 reps;Step Height: 4"    Functional Squat 10 reps    SLS x3 B   Rt: 10" Lt 20" max   Other Standing Knee Exercises tandem stance b    Other Standing Knee Exercises side stepping 2 reps      Knee/Hip Exercises: Seated   Long Arc Quad 10 reps;Right   10" holds   Long Arc Quad Weight 3 lbs.    Other Seated Knee/Hip Exercises hip adduction with ball hold 5" x 15    Sit to Sand 10 reps                       PT Short Term Goals - 01/30/21 1627       PT SHORT TERM GOAL #1  Title Patient will be IND with initial HEP to improve functional outcomes    Baseline Independent in open chain PRE and static stretching routine for RLE    Time 2    Period Weeks    Status Achieved    Target Date 01/17/21      PT SHORT TERM GOAL #2   Title Patient will report at least 25% improvement in symptoms for improved quality of life.    Baseline 10% improvement. Continues to report 8/10 pain in RLE/groin    Time 2    Period Weeks    Status On-going    Target Date 01/17/21      PT SHORT TERM GOAL #3   Title Demo right hip flexion to 120 degrees to improve functional mobility    Baseline 120    Time 2    Period Weeks    Status Achieved    Target Date 01/17/21               PT Long Term Goals - 01/30/21 1639       PT LONG TERM GOAL #1   Title Ambulate with normalized gait pattern over various surfaces x 6 minutes without increase in pain    Baseline antalgic, 8-9/10 RLE pain    Time 5    Period Weeks    Status On-going    Target Date 03/06/21      PT LONG TERM GOAL #2   Title Manifest improved RLE strength and activity tolerance as evidenced by reciprocal stair ambulation    Baseline reciprocal pattern with HR,  persistent right knee pain    Time 5    Period Weeks    Status On-going    Target Date 03/06/21      PT LONG TERM GOAL #3   Title Patient will report at least 75% improvement in symptoms for improved quality of life.    Baseline 10% per patient report    Time 4    Period Weeks    Status On-going    Target Date 03/06/21                   Plan - 02/20/21 1659     Clinical Impression Statement PT states that therapy aggrevates her hip pain.  Therapist notes that pt should let us know if we request her to complete an exercise that increases her pain.  Pt was able to be progressed in both strengthening and balance exercises with no complaint of increased pain until session is over.    Personal Factors and Comorbidities Time since onset of injury/illness/exacerbation;Comorbidity 1    Comorbidities pt notes past knee injuries and orthopedic issues    Examination-Activity Limitations Bend;Carry;Lift;Stand;Stairs;Squat;Sleep;Locomotion Level;Transfers    Examination-Participation Restrictions Cleaning;Community Activity;Driving;Occupation;Meal Prep    Stability/Clinical Decision Making Stable/Uncomplicated    Rehab Potential Good    PT Frequency 2x / week    PT Duration 4 weeks    PT Treatment/Interventions ADLs/Self Care Home Management;Aquatic Therapy;Electrical Stimulation;DME Instruction;Traction;Moist Solicitor;Therapeutic activities;Therapeutic exercise;Balance training;Neuromuscular re-education;Patient/family education;Passive range of motion;Dry needling;Taping;Spinal Manipulations;Joint Manipulations;Manual techniques    PT Next Visit Plan F/U wiht additional closed chain exercises,.  progress to open chain strength for knee, continue with hip assessment and activity as tolerated    PT Home Exercise Plan QS, hip add/abd isometric; 10/14: butterfly, FABER stretch, hip abd and extension. 10/26 hip flexion iso, bridge    Consulted and Agree with Plan of  Care Patient  Patient will benefit from skilled therapeutic intervention in order to improve the following deficits and impairments:  Abnormal gait, Decreased activity tolerance, Decreased mobility, Decreased range of motion, Decreased strength, Difficulty walking, Improper body mechanics, Pain  Visit Diagnosis: Acute pain of right knee  Other abnormalities of gait and mobility  Other symptoms and signs involving the musculoskeletal system     Problem List Patient Active Problem List   Diagnosis Date Noted   Amenorrhea 05/01/2020   Plantar fasciitis 09/01/2017   History of ovarian cyst 03/23/2015   History of abnormal cervical Pap smear 03/23/2015   Fibromyalgia 03/23/2015   Hypertension, essential 10/05/2014   Vitiligo 10/05/2014   Rayetta Humphrey, PT CLT [phone]  02/20/2021, 5:02 PM  [place] [address] [place], [place], [address] Phone: [phone]   Fax:  [phone]  Name: Hanley Rispoli MRN: [id number] Date of Birth: 08/31/1970

## 2021-02-23 ENCOUNTER — Other Ambulatory Visit: Payer: Self-pay

## 2021-02-23 ENCOUNTER — Encounter (HOSPITAL_COMMUNITY): Payer: Self-pay | Admitting: Physical Therapy

## 2021-02-23 ENCOUNTER — Other Ambulatory Visit: Payer: Self-pay | Admitting: Orthopedic Surgery

## 2021-02-23 ENCOUNTER — Ambulatory Visit (HOSPITAL_COMMUNITY): Payer: PRIVATE HEALTH INSURANCE | Attending: Orthopedic Surgery | Admitting: Physical Therapy

## 2021-02-23 ENCOUNTER — Other Ambulatory Visit (HOSPITAL_COMMUNITY): Payer: Self-pay | Admitting: Orthopedic Surgery

## 2021-02-23 DIAGNOSIS — R29898 Other symptoms and signs involving the musculoskeletal system: Secondary | ICD-10-CM | POA: Insufficient documentation

## 2021-02-23 DIAGNOSIS — M76891 Other specified enthesopathies of right lower limb, excluding foot: Secondary | ICD-10-CM | POA: Diagnosis not present

## 2021-02-23 DIAGNOSIS — R2689 Other abnormalities of gait and mobility: Secondary | ICD-10-CM | POA: Insufficient documentation

## 2021-02-23 DIAGNOSIS — M25561 Pain in right knee: Secondary | ICD-10-CM

## 2021-02-23 DIAGNOSIS — M25559 Pain in unspecified hip: Secondary | ICD-10-CM | POA: Diagnosis not present

## 2021-02-23 DIAGNOSIS — M25551 Pain in right hip: Secondary | ICD-10-CM | POA: Diagnosis not present

## 2021-02-23 NOTE — Therapy (Addendum)
[place], [place], [address] Phone: [phone]   Fax:  [phone]  Physical Therapy Treatment  Patient Details  Name: Virginia Hawkins MRN: [id number] Date of Birth: May 10, 1970 Referring Provider (PT): Edmonia Lynch, MD   Encounter Date: 02/23/2021   PT End of Session - 02/23/21 0841     Visit Number 13    Number of Visits 15    Date for PT Re-Evaluation 02/27/21    Authorization Type [facility]--This is WC (Claim# [id number]) approved for 1 eval and 8 tx, and then approved 6 visits that have to be used by 02/22/21 (fax all progress notes to [phone]); approved 2x/week for 4 weeks up until 03/26/21    Authorization - Visit Number 1    Authorization - Number of Visits 8    Progress Note Due on Visit 66    PT Start Time 0835    PT Stop Time 0913    PT Time Calculation (min) 38 min    Activity Tolerance Patient limited by pain    Behavior During Therapy [facility] for tasks assessed/performed             Past Medical History:  Diagnosis Date   Fibromyalgia    Hypertension    Ovarian cyst    Vaginal Pap smear, abnormal     Past Surgical History:  Procedure Laterality Date   CYST EXCISION     FOOT SURGERY Left 2020   for plantar fasciitis   SALPINGECTOMY      There were no vitals filed for this visit.   Subjective Assessment - 02/23/21 0841     Subjective States that physical therapy aggravates everything a little bit. Stats that she had an MRI and is following up with her MD. States she sees ortho today at 1145 for MRI results. Reports 9/10 pain in entire right leg (hip and knee).    Limitations Sitting;Lifting;Standing;Walking;House hold activities    How long can you sit comfortably? aching with prolonged sitting    How long can you stand comfortably? 15 min    How long can you walk comfortably? less than 15 minutes    Patient Stated Goals To reduce pain.    Currently in Pain? Yes     Pain Score 9     Pain Location Knee    Pain Orientation Right    Pain Descriptors / Indicators Aching;Sore    Pain Type Acute pain    Pain Onset 1 to 4 weeks ago                [facility] PT Assessment - 02/23/21 0001       Assessment   Medical Diagnosis rt anterior knee pain    Referring Provider (PT) Edmonia Lynch, MD                           [facility] Adult PT Treatment/Exercise - 02/23/21 0001       Knee/Hip Exercises: Stretches   Gastroc Stretch Right;3 reps;30 seconds      Knee/Hip Exercises: Standing   Other Standing Knee Exercises side step ups 6" B  3x5 no UE support; heel walk in // bars x5, toe walk in bars x5; chair pose 10 seconds    Other Standing Knee Exercises step ups and dowsn on 6" stp 3x10 R; tandem on black foam x3 30" holds B  PT Short Term Goals - 01/30/21 1627       PT SHORT TERM GOAL #1   Title Patient will be IND with initial HEP to improve functional outcomes    Baseline Independent in open chain PRE and static stretching routine for RLE    Time 2    Period Weeks    Status Achieved    Target Date 01/17/21      PT SHORT TERM GOAL #2   Title Patient will report at least 25% improvement in symptoms for improved quality of life.    Baseline 10% improvement. Continues to report 8/10 pain in RLE/groin    Time 2    Period Weeks    Status On-going    Target Date 01/17/21      PT SHORT TERM GOAL #3   Title Demo right hip flexion to 120 degrees to improve functional mobility    Baseline 120    Time 2    Period Weeks    Status Achieved    Target Date 01/17/21               PT Long Term Goals - 01/30/21 1639       PT LONG TERM GOAL #1   Title Ambulate with normalized gait pattern over various surfaces x 6 minutes without increase in pain    Baseline antalgic, 8-9/10 RLE pain    Time 5    Period Weeks    Status On-going    Target Date 03/06/21      PT LONG TERM GOAL #2   Title  Manifest improved RLE strength and activity tolerance as evidenced by reciprocal stair ambulation    Baseline reciprocal pattern with HR, persistent right knee pain    Time 5    Period Weeks    Status On-going    Target Date 03/06/21      PT LONG TERM GOAL #3   Title Patient will report at least 75% improvement in symptoms for improved quality of life.    Baseline 10% per patient report    Time 4    Period Weeks    Status On-going    Target Date 03/06/21                   Plan - 02/23/21 0908     Clinical Impression Statement New authorization for visits received this morning for 2x/week for 4 weeks. Discussed this frequency with patient, and patient agrees with current treatment plan of 1x/week for 4 weeks. Continued pain noted with all exercises but patient able to perform all exercises with minimal difficult in regards to form. Patient with minimal rest breaks as she tends to push through exercises to get them completed. Added balance exercise on this date which was tolerated moderately well but continued burning pain noted in right leg. Increased discomfort noted in leg end of session but patient continued to report 9/10 pain.    Personal Factors and Comorbidities Time since onset of injury/illness/exacerbation;Comorbidity 1    Comorbidities pt notes past knee injuries and orthopedic issues    Examination-Activity Limitations Bend;Carry;Lift;Stand;Stairs;Squat;Sleep;Locomotion Level;Transfers    Examination-Participation Restrictions Cleaning;Community Activity;Driving;Occupation;Meal Prep    Stability/Clinical Decision Making Stable/Uncomplicated    Rehab Potential Good    PT Frequency 2x / week    PT Duration 4 weeks    PT Treatment/Interventions ADLs/Self Care Home Management;Aquatic Therapy;Electrical Stimulation;DME Instruction;Traction;Moist Solicitor;Therapeutic activities;Therapeutic exercise;Balance training;Neuromuscular  re-education;Patient/family education;Passive range of motion;Dry needling;Taping;Spinal Manipulations;Joint Manipulations;Manual techniques  PT Next Visit Plan F/U wiht additional closed chain exercises,.  progress to open chain strength for knee, continue with hip assessment and activity as tolerated    PT Home Exercise Plan QS, hip add/abd isometric; 10/14: butterfly, FABER stretch, hip abd and extension. 10/26 hip flexion iso, bridge    Consulted and Agree with Plan of Care Patient             Patient will benefit from skilled therapeutic intervention in order to improve the following deficits and impairments:  Abnormal gait, Decreased activity tolerance, Decreased mobility, Decreased range of motion, Decreased strength, Difficulty walking, Improper body mechanics, Pain  Visit Diagnosis: Acute pain of right knee  Other abnormalities of gait and mobility  Other symptoms and signs involving the musculoskeletal system     Problem List Patient Active Problem List   Diagnosis Date Noted   Amenorrhea 05/01/2020   Plantar fasciitis 09/01/2017   History of ovarian cyst 03/23/2015   History of abnormal cervical Pap smear 03/23/2015   Fibromyalgia 03/23/2015   Hypertension, essential 10/05/2014   Vitiligo 10/05/2014    9:30 AM, 02/23/21 Jerene Pitch, DPT Physical Therapy with [facility]  [phone] office   [place], [place], [address] Phone: [phone]   Fax:  [phone]  Name: Lita Flynn MRN: [id number] Date of Birth: 1970-10-15

## 2021-02-26 ENCOUNTER — Other Ambulatory Visit (HOSPITAL_COMMUNITY): Payer: Self-pay

## 2021-02-26 MED ORDER — DIAZEPAM 5 MG PO TABS
5.0000 mg | ORAL_TABLET | ORAL | 0 refills | Status: DC
  Filled 2021-02-26: qty 2, 2d supply, fill #0

## 2021-02-27 ENCOUNTER — Ambulatory Visit: Payer: 59 | Admitting: Podiatry

## 2021-02-28 ENCOUNTER — Ambulatory Visit (HOSPITAL_COMMUNITY): Payer: PRIVATE HEALTH INSURANCE

## 2021-02-28 ENCOUNTER — Other Ambulatory Visit: Payer: Self-pay

## 2021-02-28 DIAGNOSIS — M25561 Pain in right knee: Secondary | ICD-10-CM

## 2021-02-28 DIAGNOSIS — R2689 Other abnormalities of gait and mobility: Secondary | ICD-10-CM

## 2021-02-28 DIAGNOSIS — R29898 Other symptoms and signs involving the musculoskeletal system: Secondary | ICD-10-CM

## 2021-02-28 NOTE — Therapy (Signed)
[place], [place], [address] Phone: [phone]   Fax:  [phone]  Physical Therapy Treatment and Recertification  Patient Details  Name: Virginia Hawkins MRN: [id number] Date of Birth: 15-Jun-1970 Referring Provider (PT): Edmonia Lynch, MD   Encounter Date: 02/28/2021   PT End of Session - 02/28/21 1656     Visit Number 14    Number of Visits 21    Date for PT Re-Evaluation 03/28/21    Authorization Type [employer]--This is WC (Claim# [id number]) approved for 1 eval and 8 tx, and then approved 6 visits that have to be used by 02/22/21 (fax all progress notes to [phone]); approved 2x/week for 4 weeks up until 03/26/21    Authorization - Visit Number 2    Authorization - Number of Visits 8    Progress Note Due on Visit 31    PT Start Time 1645    PT Stop Time 1730    PT Time Calculation (min) 45 min    Activity Tolerance Patient limited by pain    Behavior During Therapy [facility] for tasks assessed/performed             Past Medical History:  Diagnosis Date   Fibromyalgia    Hypertension    Ovarian cyst    Vaginal Pap smear, abnormal     Past Surgical History:  Procedure Laterality Date   CYST EXCISION     FOOT SURGERY Left 2020   for plantar fasciitis   SALPINGECTOMY      There were no vitals filed for this visit.   Subjective Assessment - 02/28/21 1651     Subjective Pt notes not much change in symptoms and performing half work day on light duty and half regular duty    Limitations Sitting;Lifting;Standing;Walking;House hold activities    How long can you sit comfortably? aching with prolonged sitting    How long can you stand comfortably? 15 min    How long can you walk comfortably? less than 15 minutes    Patient Stated Goals To reduce pain.    Currently in Pain? Yes    Pain Score 9     Pain Location Knee    Pain Orientation Right    Pain Descriptors / Indicators  Aching;Sore    Pain Type Acute pain    Pain Onset 1 to 4 weeks ago                [facility] PT Assessment - 02/28/21 0001       Assessment   Medical Diagnosis rt anterior knee pain    Referring Provider (PT) Edmonia Lynch, MD      Ambulation/Gait   Stairs Yes    Stairs Assistance 6: Modified independent (Device/Increase time)    Stair Management Technique One rail Left;Alternating pattern                           [facility] Adult PT Treatment/Exercise - 02/28/21 0001       Knee/Hip Exercises: Stretches   Gastroc Stretch Right;3 reps;30 seconds      Knee/Hip Exercises: Aerobic   Tread Mill level surface 1-1.5 mph x 8 min to improve ambulation tolerance      Knee/Hip Exercises: Standing   Other Standing Knee Exercises sidestepping x 2 min 3 lbs ankle weights      Knee/Hip Exercises: Seated   Long Arc Quad Strengthening;Right;3 sets;10 reps  Long Arc Quad Weight 3 lbs.    Long Arc Quad Limitations 3 sec hold    Ball Squeeze 3x10 3 sec hold    Sit to [employer] 5 reps                       PT Short Term Goals - 02/28/21 1740       PT SHORT TERM GOAL #1   Title Patient will be IND with initial HEP to improve functional outcomes    Baseline Independent in open chain PRE and static stretching routine for RLE    Time 2    Period Weeks    Status Achieved    Target Date 01/17/21      PT SHORT TERM GOAL #2   Title Patient will report at least 25% improvement in symptoms for improved quality of life.    Baseline 10% improvement. Continues to report 8/10 pain in RLE/groin    Time 2    Period Weeks    Status On-going    Target Date 03/28/21      PT SHORT TERM GOAL #3   Title Demo right hip flexion to 120 degrees to improve functional mobility    Baseline 120    Time 2    Period Weeks    Status Achieved    Target Date 01/17/21               PT Long Term Goals - 02/28/21 1741       PT LONG TERM GOAL #1   Title Ambulate with  normalized gait pattern over various surfaces x 6 minutes without increase in pain    Baseline normalized pattern 9/10 pain    Time 5    Period Weeks    Status Achieved    Target Date 03/06/21      PT LONG TERM GOAL #2   Title Manifest improved RLE strength and activity tolerance as evidenced by reciprocal stair ambulation    Baseline reciprocal pattern with HR    Time 5    Period Weeks    Status Achieved    Target Date 03/06/21      PT LONG TERM GOAL #3   Title Patient will report at least 75% improvement in symptoms for improved quality of life.    Baseline 10% per patient report    Time 4    Period Weeks    Status On-going    Target Date 03/28/21                   Plan - 02/28/21 1738     Clinical Impression Statement Pt notes continued RLE discomfort with clicking and popping experienced around right knee, no instances of buckling or locking reported.  Continued right groin pain and experiencing some clicking along groin area as well per her report.  Continues to be limited in activity tolerance requiring rest periods during activities but without significant increase in pain from her baseline 9/10 level.  Able to ambulate with normalized pattern without antalgia on level surfaces.  Continued sessions to improve strength and activity tolerance to enhance functional capabilities    Personal Factors and Comorbidities Time since onset of injury/illness/exacerbation;Comorbidity 1    Comorbidities pt notes past knee injuries and orthopedic issues    Examination-Activity Limitations Bend;Carry;Lift;Stand;Stairs;Squat;Sleep;Locomotion Level;Transfers    Examination-Participation Restrictions Cleaning;Community Activity;Driving;Occupation;Meal Prep    Stability/Clinical Decision Making Stable/Uncomplicated    Rehab Potential Good    PT Frequency 2x /  week    PT Duration 4 weeks    PT Treatment/Interventions ADLs/Self Care Home Management;Aquatic Therapy;Electrical  Stimulation;DME Instruction;Traction;Moist Solicitor;Therapeutic activities;Therapeutic exercise;Balance training;Neuromuscular re-education;Patient/family education;Passive range of motion;Dry needling;Taping;Spinal Manipulations;Joint Manipulations;Manual techniques    PT Next Visit Plan F/U wiht additional closed chain exercises,.  progress to open chain strength for knee, continue with hip assessment and activity as tolerated    PT Home Exercise Plan QS, hip add/abd isometric; 10/14: butterfly, FABER stretch, hip abd and extension. 10/26 hip flexion iso, bridge    Consulted and Agree with Plan of Care Patient             Patient will benefit from skilled therapeutic intervention in order to improve the following deficits and impairments:  Abnormal gait, Decreased activity tolerance, Decreased mobility, Decreased range of motion, Decreased strength, Difficulty walking, Improper body mechanics, Pain  Visit Diagnosis: Acute pain of right knee  Other abnormalities of gait and mobility  Other symptoms and signs involving the musculoskeletal system     Problem List Patient Active Problem List   Diagnosis Date Noted   Amenorrhea 05/01/2020   Plantar fasciitis 09/01/2017   History of ovarian cyst 03/23/2015   History of abnormal cervical Pap smear 03/23/2015   Fibromyalgia 03/23/2015   Hypertension, essential 10/05/2014   Vitiligo 10/05/2014    Toniann Fail, PT 02/28/2021, 5:43 PM  [place] [address] [place], [place], [address] Phone: [phone]   Fax:  [phone]  Name: Keyla Milone MRN: [id number] Date of Birth: [birth year]

## 2021-03-02 ENCOUNTER — Ambulatory Visit (HOSPITAL_COMMUNITY)
Admission: RE | Admit: 2021-03-02 | Discharge: 2021-03-02 | Disposition: A | Discharge status: Home / Self Care | Payer: PRIVATE HEALTH INSURANCE | Source: Ambulatory Visit | Attending: Orthopedic Surgery | Admitting: Orthopedic Surgery

## 2021-03-02 ENCOUNTER — Other Ambulatory Visit: Payer: Self-pay

## 2021-03-02 DIAGNOSIS — M25561 Pain in right knee: Secondary | ICD-10-CM | POA: Insufficient documentation

## 2021-03-06 ENCOUNTER — Ambulatory Visit: Payer: 59 | Admitting: Podiatry

## 2021-03-06 DIAGNOSIS — M25551 Pain in right hip: Secondary | ICD-10-CM | POA: Diagnosis not present

## 2021-03-06 DIAGNOSIS — M76891 Other specified enthesopathies of right lower limb, excluding foot: Secondary | ICD-10-CM | POA: Diagnosis not present

## 2021-03-07 ENCOUNTER — Other Ambulatory Visit: Payer: Self-pay

## 2021-03-07 ENCOUNTER — Ambulatory Visit (HOSPITAL_COMMUNITY): Payer: PRIVATE HEALTH INSURANCE

## 2021-03-07 DIAGNOSIS — M25561 Pain in right knee: Secondary | ICD-10-CM

## 2021-03-07 DIAGNOSIS — R2689 Other abnormalities of gait and mobility: Secondary | ICD-10-CM

## 2021-03-07 DIAGNOSIS — R29898 Other symptoms and signs involving the musculoskeletal system: Secondary | ICD-10-CM

## 2021-03-07 NOTE — Therapy (Signed)
[place], [place], [address] Phone: [phone]   Fax:  [phone]  Physical Therapy Treatment  Patient Details  Name: Virginia Hawkins MRN: [id number] Date of Birth: [birth year] Referring Provider (PT): Edmonia Lynch, MD   Encounter Date: 03/07/2021   PT End of Session - 03/07/21 1726     Visit Number 15    Number of Visits 29    Date for PT Re-Evaluation 04/06/21    Authorization Type [employer]--This is WC (Claim# [id number]) approved for 1 eval and 8 tx, and then approved 6 visits that have to be used by 02/22/21 (fax all progress notes to [phone]); approved 2x/week for 4 weeks up until 03/26/21    Authorization - Visit Number 3    Authorization - Number of Visits 16    Progress Note Due on Visit 33    PT Start Time 1710   late arrival due to previous appointment   PT Stop Time 1730    PT Time Calculation (min) 20 min    Activity Tolerance Patient limited by pain    Behavior During Therapy [facility] for tasks assessed/performed             Past Medical History:  Diagnosis Date   Fibromyalgia    Hypertension    Ovarian cyst    Vaginal Pap smear, abnormal     Past Surgical History:  Procedure Laterality Date   CYST EXCISION     FOOT SURGERY Left 2020   for plantar fasciitis   SALPINGECTOMY      There were no vitals filed for this visit.   Subjective Assessment - 03/07/21 1718     Subjective Pt notes continued right knee pain and had MRI recently of right knee which reveals swelling in Hoffa's fat pad and some marrow edema. Pt reports the presiding MD has reduced her back to light duty to hopefully enable greater effort in her therapy sessions. Pt reports continued right knee and groin pain    Limitations Sitting;Lifting;Standing;Walking;House hold activities    How long can you sit comfortably? aching with prolonged sitting    How long can you stand comfortably? 15 min    How  long can you walk comfortably? less than 15 minutes    Patient Stated Goals To reduce pain.    Currently in Pain? Yes    Pain Score 7     Pain Location Knee    Pain Orientation Right    Pain Type Acute pain    Pain Onset 1 to 4 weeks ago    Pain Score 10    Pain Location Groin    Pain Orientation Right    Pain Descriptors / Indicators Aching                [facility] PT Assessment - 03/07/21 0001       Assessment   Medical Diagnosis rt anterior knee pain    Referring Provider (PT) Edmonia Lynch, MD                                    PT Education - 03/07/21 1741     Education Details discussion with pt regarding current symptoms and her report of increased pain and inflammation following tx sessions.    Person(s) Educated Patient    Methods Explanation    Comprehension Verbalized understanding  PT Short Term Goals - 02/28/21 1740       PT SHORT TERM GOAL #1   Title Patient will be IND with initial HEP to improve functional outcomes    Baseline Independent in open chain PRE and static stretching routine for RLE    Time 2    Period Weeks    Status Achieved    Target Date 01/17/21      PT SHORT TERM GOAL #2   Title Patient will report at least 25% improvement in symptoms for improved quality of life.    Baseline 10% improvement. Continues to report 8/10 pain in RLE/groin    Time 2    Period Weeks    Status On-going    Target Date 03/28/21      PT SHORT TERM GOAL #3   Title Demo right hip flexion to 120 degrees to improve functional mobility    Baseline 120    Time 2    Period Weeks    Status Achieved    Target Date 01/17/21               PT Long Term Goals - 02/28/21 1741       PT LONG TERM GOAL #1   Title Ambulate with normalized gait pattern over various surfaces x 6 minutes without increase in pain    Baseline normalized pattern 9/10 pain    Time 5    Period Weeks    Status Achieved    Target Date  03/06/21      PT LONG TERM GOAL #2   Title Manifest improved RLE strength and activity tolerance as evidenced by reciprocal stair ambulation    Baseline reciprocal pattern with HR    Time 5    Period Weeks    Status Achieved    Target Date 03/06/21      PT LONG TERM GOAL #3   Title Patient will report at least 75% improvement in symptoms for improved quality of life.    Baseline 10% per patient report    Time 4    Period Weeks    Status On-going    Target Date 03/28/21                   Plan - 03/07/21 1743     Clinical Impression Statement Pt reports recent MRI and diagnosis of right hip labral tear and MRI right knee revealing swelling in Hoffa's fat pad.  Pt reports she has not been experiencing much relief and feels more pain, discomfort, and inflammation following treatment sessions.  Overall, she has exhibited poor activity tolerance and consistently high pain ratings without much relief.  Activity modification and curtailing her HEP for ROM and strengthening has also ocurred to quite a significant degree, and yet no reduction in her symptoms.  Pt is able to ambulate with decreased antalgia and able to perform a squat to half-range, albeit with offloading/asymmetry from her affected RLE.  Patient does not feel or perceive much benefit from therapy at this time despite increased right hip and knee ROM.    Personal Factors and Comorbidities Time since onset of injury/illness/exacerbation;Comorbidity 1    Comorbidities pt notes past knee injuries and orthopedic issues    Examination-Activity Limitations Bend;Carry;Lift;Stand;Stairs;Squat;Sleep;Locomotion Level;Transfers    Examination-Participation Restrictions Cleaning;Community Activity;Driving;Occupation;Meal Prep    Stability/Clinical Decision Making Stable/Uncomplicated    Rehab Potential Good    PT Frequency 2x / week    PT Duration 4 weeks    PT Treatment/Interventions  ADLs/Self Care Home Management;Aquatic  Therapy;Electrical Stimulation;DME Instruction;Traction;Moist Solicitor;Therapeutic activities;Therapeutic exercise;Balance training;Neuromuscular re-education;Patient/family education;Passive range of motion;Dry needling;Taping;Spinal Manipulations;Joint Manipulations;Manual techniques    PT Next Visit Plan F/U wiht additional closed chain exercises,.  progress to open chain strength for knee, continue with hip assessment and activity as tolerated    PT Home Exercise Plan QS, hip add/abd isometric; 10/14: butterfly, FABER stretch, hip abd and extension. 10/26 hip flexion iso, bridge    Consulted and Agree with Plan of Care Patient             Patient will benefit from skilled therapeutic intervention in order to improve the following deficits and impairments:  Abnormal gait, Decreased activity tolerance, Decreased mobility, Decreased range of motion, Decreased strength, Difficulty walking, Improper body mechanics, Pain  Visit Diagnosis: Acute pain of right knee  Other abnormalities of gait and mobility  Other symptoms and signs involving the musculoskeletal system     Problem List Patient Active Problem List   Diagnosis Date Noted   Amenorrhea 05/01/2020   Plantar fasciitis 09/01/2017   History of ovarian cyst 03/23/2015   History of abnormal cervical Pap smear 03/23/2015   Fibromyalgia 03/23/2015   Hypertension, essential 10/05/2014   Vitiligo 10/05/2014    Toniann Fail, PT 03/07/2021, 5:59 PM  [facility] [address] [place], [place], [address] Phone: [phone]   Fax:  [phone]  Name: Virginia Hawkins MRN: [id number] Date of Birth: [birth year]

## 2021-03-08 ENCOUNTER — Ambulatory Visit: Payer: 59 | Admitting: Podiatry

## 2021-03-08 DIAGNOSIS — M722 Plantar fascial fibromatosis: Secondary | ICD-10-CM | POA: Diagnosis not present

## 2021-03-08 DIAGNOSIS — G8929 Other chronic pain: Secondary | ICD-10-CM | POA: Diagnosis not present

## 2021-03-08 DIAGNOSIS — M79672 Pain in left foot: Secondary | ICD-10-CM | POA: Diagnosis not present

## 2021-03-11 ENCOUNTER — Other Ambulatory Visit (HOSPITAL_COMMUNITY): Payer: Self-pay

## 2021-03-11 NOTE — Progress Notes (Signed)
Subjective: 50 year old female presents the office today for concerns of evaluation of left foot pain.  She states that she is doing the same.  She has good days and bad days.  She states the foot is not usually cause her to call out of work but she wants to extend the FMLA in case.    Objective: AAO x3, NAD DP/PT pulses palpable bilaterally, CRT less than 3 seconds Left foot is present.  No area of pinpoint tenderness.  No significant discomfort identified today's exam.  The majority tenderness is on the arch of the foot on the medial plantar plantar pressure.  There is no edema, erythema.  Negative Tinel sign.  No edema.  Flexor, extensor tendons appear to be intact.  MMT 5/5. No pain with calf compression, swelling, warmth, erythema  Assessment: Chronic foot pain left foot  Plan: -All treatment options discussed with the patient including all alternatives, risks, complications.  -We have attempted numerous conservative treatment without significant resolution she did not have surgery.  We will refer her back to physical therapy to also include dry needling to see if this will be helpful.  Continue shoes and good arch support.  We will extend her intermittent FMLA for 6 months.   Trula Slade DPM

## 2021-03-12 ENCOUNTER — Other Ambulatory Visit (HOSPITAL_COMMUNITY): Payer: Self-pay

## 2021-03-13 ENCOUNTER — Other Ambulatory Visit (HOSPITAL_COMMUNITY): Payer: Self-pay

## 2021-03-13 MED ORDER — METOPROLOL SUCCINATE ER 50 MG PO TB24
50.0000 mg | ORAL_TABLET | Freq: Every day | ORAL | 3 refills | Status: DC
  Filled 2021-03-13: qty 90, 90d supply, fill #0
  Filled 2021-06-17: qty 90, 90d supply, fill #1
  Filled 2021-09-22: qty 90, 90d supply, fill #2
  Filled 2021-12-23: qty 90, 90d supply, fill #3

## 2021-03-14 ENCOUNTER — Ambulatory Visit (HOSPITAL_COMMUNITY): Payer: PRIVATE HEALTH INSURANCE

## 2021-03-14 ENCOUNTER — Other Ambulatory Visit: Payer: Self-pay

## 2021-03-14 DIAGNOSIS — R2689 Other abnormalities of gait and mobility: Secondary | ICD-10-CM

## 2021-03-14 DIAGNOSIS — M25561 Pain in right knee: Secondary | ICD-10-CM | POA: Diagnosis not present

## 2021-03-14 DIAGNOSIS — R29898 Other symptoms and signs involving the musculoskeletal system: Secondary | ICD-10-CM

## 2021-03-14 NOTE — Therapy (Signed)
[place], [place], [address] Phone: [phone]   Fax:  [phone]  Physical Therapy Treatment  Patient Details  Name: Virginia Hawkins MRN: [id number] Date of Birth: 08/21/1970 Referring Provider (PT): Edmonia Lynch, MD   Encounter Date: 03/14/2021   PT End of Session - 03/14/21 1625     Visit Number 16    Number of Visits 29    Date for PT Re-Evaluation 04/06/21    Authorization Type [employer]--This is WC (Claim# [id number]) approved for 1 eval and 8 tx, and then approved 6 visits that have to be used by 02/22/21 (fax all progress notes to [phone]); approved 2x/week for 4 weeks up until 03/26/21    Authorization - Visit Number 4    Authorization - Number of Visits 16    Progress Note Due on Visit 72    PT Start Time 1630    PT Stop Time 5625    PT Time Calculation (min) 45 min    Activity Tolerance Patient limited by pain    Behavior During Therapy [facility] for tasks assessed/performed             Past Medical History:  Diagnosis Date   Fibromyalgia    Hypertension    Ovarian cyst    Vaginal Pap smear, abnormal     Past Surgical History:  Procedure Laterality Date   CYST EXCISION     FOOT SURGERY Left 2020   for plantar fasciitis   SALPINGECTOMY      There were no vitals filed for this visit.   Subjective Assessment - 03/14/21 1713     Subjective Pt reports not feeling much difference in pain level of right knee and performs work duties with ability to enact seated and standing periods ad lib.    Limitations Sitting;Lifting;Standing;Walking;House hold activities    How long can you sit comfortably? aching with prolonged sitting    How long can you stand comfortably? 15 min    How long can you walk comfortably? less than 15 minutes    Patient Stated Goals To reduce pain.    Currently in Pain? Yes    Pain Score 7     Pain Location Knee    Pain Orientation Right    Pain  Descriptors / Indicators Sore    Pain Onset 1 to 4 weeks ago                [facility] PT Assessment - 03/14/21 0001       Assessment   Medical Diagnosis rt anterior knee pain                           [facility] Adult PT Treatment/Exercise - 03/14/21 0001       Ambulation/Gait   Ambulation/Gait Yes    Ambulation/Gait Assistance 7: Independent    Ambulation Distance (Feet) 0.2 Feet    Assistive device None    Gait Pattern Within Functional Limits    Ambulation Surface Level;Indoor    Gait velocity 1.3-1.5 mph    Gait Comments Gait training with BUE support on assist rails to improve velocity and stride length x 8 min      Knee/Hip Exercises: Standing   Other Standing Knee Exercises sidestepping x 2 min green t-band      Knee/Hip Exercises: Seated   Long Arc Quad Strengthening;Right;3 sets;10 reps    Long Arc Quad Limitations 3 sec hold  Knee/Hip Exercises: Supine   Short Arc Quad Sets Strengthening;Right;3 sets;10 reps    Short Arc Quad Sets Limitations 5    Bridges with Clamshell Strengthening;Both;3 sets;10 reps    Other Supine Knee/Hip Exercises hip add 3x10 3 sec hold    Other Supine Knee/Hip Exercises supine hip flexion with green loop 2x10                       PT Short Term Goals - 02/28/21 1740       PT SHORT TERM GOAL #1   Title Patient will be IND with initial HEP to improve functional outcomes    Baseline Independent in open chain PRE and static stretching routine for RLE    Time 2    Period Weeks    Status Achieved    Target Date 01/17/21      PT SHORT TERM GOAL #2   Title Patient will report at least 25% improvement in symptoms for improved quality of life.    Baseline 10% improvement. Continues to report 8/10 pain in RLE/groin    Time 2    Period Weeks    Status On-going    Target Date 03/28/21      PT SHORT TERM GOAL #3   Title Demo right hip flexion to 120 degrees to improve functional mobility    Baseline 120     Time 2    Period Weeks    Status Achieved    Target Date 01/17/21               PT Long Term Goals - 02/28/21 1741       PT LONG TERM GOAL #1   Title Ambulate with normalized gait pattern over various surfaces x 6 minutes without increase in pain    Baseline normalized pattern 9/10 pain    Time 5    Period Weeks    Status Achieved    Target Date 03/06/21      PT LONG TERM GOAL #2   Title Manifest improved RLE strength and activity tolerance as evidenced by reciprocal stair ambulation    Baseline reciprocal pattern with HR    Time 5    Period Weeks    Status Achieved    Target Date 03/06/21      PT LONG TERM GOAL #3   Title Patient will report at least 75% improvement in symptoms for improved quality of life.    Baseline 10% per patient report    Time 4    Period Weeks    Status On-going    Target Date 03/28/21                   Plan - 03/14/21 1715     Clinical Impression Statement Continues to note right knee pain with activities and work duties.  Demonstrates right knee full AROM and progressing open chain quadriceps strengthening with 5 lbs ankle weight without fatigue or discomfort.  No edema noted right knee and ambulation WNL, no antalgic station noted and progressing with gait speed increase to 1.5 mph on treadmill. Progressing with hip stabilization activities to reduce groin pain and enhance proximal stabilization to improve activity tolerance. Pt able to tolerate activities without increase in pain appreciated    Personal Factors and Comorbidities Time since onset of injury/illness/exacerbation;Comorbidity 1    Comorbidities pt notes past knee injuries and orthopedic issues    Examination-Activity Limitations Bend;Carry;Lift;Stand;Stairs;Squat;Sleep;Locomotion Level;Transfers    Examination-Participation Restrictions Cleaning;Community Activity;Driving;Occupation;Meal  Prep    Stability/Clinical Decision Making Stable/Uncomplicated    Rehab  Potential Good    PT Frequency 2x / week    PT Duration 4 weeks    PT Treatment/Interventions ADLs/Self Care Home Management;Aquatic Therapy;Electrical Stimulation;DME Instruction;Traction;Moist Solicitor;Therapeutic activities;Therapeutic exercise;Balance training;Neuromuscular re-education;Patient/family education;Passive range of motion;Dry needling;Taping;Spinal Manipulations;Joint Manipulations;Manual techniques    PT Next Visit Plan F/U wiht additional closed chain exercises,.  progress to open chain strength for knee, continue with hip assessment and activity as tolerated    PT Home Exercise Plan SAQ, supine clam shells in hooklying, ball squeeze, sidestepping with green t-loop    Consulted and Agree with Plan of Care Patient             Patient will benefit from skilled therapeutic intervention in order to improve the following deficits and impairments:  Abnormal gait, Decreased activity tolerance, Decreased mobility, Decreased range of motion, Decreased strength, Difficulty walking, Improper body mechanics, Pain  Visit Diagnosis: Acute pain of right knee  Other abnormalities of gait and mobility  Other symptoms and signs involving the musculoskeletal system     Problem List Patient Active Problem List   Diagnosis Date Noted   Amenorrhea 05/01/2020   Plantar fasciitis 09/01/2017   History of ovarian cyst 03/23/2015   History of abnormal cervical Pap smear 03/23/2015   Fibromyalgia 03/23/2015   Hypertension, essential 10/05/2014   Vitiligo 10/05/2014    Toniann Fail, PT 03/14/2021, 5:23 PM  [facility] [address] [place], [place], [address] Phone: [phone]   Fax:  [phone]  Name: Virginia Hawkins MRN: [id number] Date of Birth: 09-Sep-1970

## 2021-03-15 DIAGNOSIS — M76891 Other specified enthesopathies of right lower limb, excluding foot: Secondary | ICD-10-CM | POA: Diagnosis not present

## 2021-03-16 ENCOUNTER — Ambulatory Visit (HOSPITAL_COMMUNITY): Payer: PRIVATE HEALTH INSURANCE

## 2021-03-21 ENCOUNTER — Ambulatory Visit (HOSPITAL_COMMUNITY): Payer: PRIVATE HEALTH INSURANCE

## 2021-03-21 ENCOUNTER — Other Ambulatory Visit: Payer: Self-pay

## 2021-03-21 DIAGNOSIS — M25561 Pain in right knee: Secondary | ICD-10-CM | POA: Diagnosis not present

## 2021-03-21 DIAGNOSIS — R2689 Other abnormalities of gait and mobility: Secondary | ICD-10-CM

## 2021-03-21 NOTE — Therapy (Signed)
[place], [place], [address] Phone: [phone]   Fax:  [phone]  Physical Therapy Treatment  Patient Details  Name: Virginia Hawkins MRN: [id number] Date of Birth: [birth year] Referring Provider (PT): Edmonia Lynch, MD   Encounter Date: 03/21/2021   PT End of Session - 03/21/21 1706     Visit Number 17    Number of Visits 29    Date for PT Re-Evaluation 04/06/21    Authorization Type [employer]--This is WC (Claim# [id number]) approved for 1 eval and 8 tx, and then approved 6 visits that have to be used by 02/22/21 (fax all progress notes to [phone]); approved 2x/week for 4 weeks up until 03/26/21    Authorization - Visit Number 4    Authorization - Number of Visits 16    Progress Note Due on Visit 72    PT Start Time 1707   late arrival   PT Stop Time 1730    PT Time Calculation (min) 23 min    Activity Tolerance Patient limited by pain    Behavior During Therapy [facility] for tasks assessed/performed             Past Medical History:  Diagnosis Date   Fibromyalgia    Hypertension    Ovarian cyst    Vaginal Pap smear, abnormal     Past Surgical History:  Procedure Laterality Date   CYST EXCISION     FOOT SURGERY Left 2020   for plantar fasciitis   SALPINGECTOMY      There were no vitals filed for this visit.   Subjective Assessment - 03/21/21 1708     Subjective Continued right knee pain after today where she was working the floor more often and pushing cart which she noticed caused increased right knee pain    Limitations Sitting;Lifting;Standing;Walking;House hold activities    How long can you sit comfortably? aching with prolonged sitting    How long can you stand comfortably? 15 min    How long can you walk comfortably? less than 15 minutes    Patient Stated Goals To reduce pain.    Currently in Pain? Yes    Pain Score 7     Pain Location Knee    Pain Orientation  Right    Pain Descriptors / Indicators Sore    Pain Type Acute pain    Pain Onset 1 to 4 weeks ago                [facility] PT Assessment - 03/21/21 0001       Assessment   Medical Diagnosis rt anterior knee pain                           [facility] Adult PT Treatment/Exercise - 03/21/21 0001       Ambulation/Gait   Ambulation/Gait Yes    Ambulation/Gait Assistance 7: Independent    Assistive device None    Gait Pattern Within Functional Limits    Ambulation Surface Level;Indoor    Gait velocity 1.3-1.5 mph    Gait Comments gait training to normalize pattern and improve activity tolerance. No UE support needed x 8 min      Knee/Hip Exercises: Standing   Knee Flexion Strengthening;Right;2 sets;10 reps    Knee Flexion Limitations 5#    Other Standing Knee Exercises sidestepping x 2 min 5# RLE      Knee/Hip Exercises: Seated   Long  Arc [employer] Strengthening;3 sets;10 reps    Long Arc [employer] 5 lbs.                       PT Short Term Goals - 02/28/21 1740       PT SHORT TERM GOAL #1   Title Patient will be IND with initial HEP to improve functional outcomes    Baseline Independent in open chain PRE and static stretching routine for RLE    Time 2    Period Weeks    Status Achieved    Target Date 01/17/21      PT SHORT TERM GOAL #2   Title Patient will report at least 25% improvement in symptoms for improved quality of life.    Baseline 10% improvement. Continues to report 8/10 pain in RLE/groin    Time 2    Period Weeks    Status On-going    Target Date 03/28/21      PT SHORT TERM GOAL #3   Title Demo right hip flexion to 120 degrees to improve functional mobility    Baseline 120    Time 2    Period Weeks    Status Achieved    Target Date 01/17/21               PT Long Term Goals - 02/28/21 1741       PT LONG TERM GOAL #1   Title Ambulate with normalized gait pattern over various surfaces x 6 minutes without increase in  pain    Baseline normalized pattern 9/10 pain    Time 5    Period Weeks    Status Achieved    Target Date 03/06/21      PT LONG TERM GOAL #2   Title Manifest improved RLE strength and activity tolerance as evidenced by reciprocal stair ambulation    Baseline reciprocal pattern with HR    Time 5    Period Weeks    Status Achieved    Target Date 03/06/21      PT LONG TERM GOAL #3   Title Patient will report at least 75% improvement in symptoms for improved quality of life.    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  Pt reports continued lmitation in activity tolerance requiring frequent rest periods during her work shift from right knee pain.  Tolerating addition of standing resistance exercises and demonstrating full right knee AROM. Normalized gait pattern appreciated with typical stride length and progressing with increased gait velocity. Patient notes main issue now is pain-limited activity tolerance during her work shift and home life. Continued session to improve body mechanics and progress to functional lifting activities    Personal Factors and Comorbidities Time since onset of injury/illness/exacerbation;Comorbidity 1    Comorbidities pt notes past knee injuries and orthopedic issues    Examination-Activity Limitations Bend;Carry;Lift;Stand;Stairs;Squat;Sleep;Locomotion Level;Transfers    Examination-Participation Restrictions Cleaning;Community Activity;Driving;Occupation;Meal Prep    Stability/Clinical Decision Making Stable/Uncomplicated    Rehab Potential Good    PT Frequency 2x / week    PT Duration 4  weeks    PT Treatment/Interventions ADLs/Self Care Home Management;Aquatic  Therapy;Electrical Stimulation;DME Instruction;Traction;Moist Solicitor;Therapeutic activities;Therapeutic exercise;Balance training;Neuromuscular re-education;Patient/family education;Passive range of motion;Dry needling;Taping;Spinal Manipulations;Joint Manipulations;Manual techniques    PT Next Visit Plan F/U wiht additional closed chain exercises,.  progress to open chain strength for knee, continue with hip assessment and activity as tolerated    PT Home Exercise Plan SAQ, supine clam shells in hooklying, ball squeeze, sidestepping with green t-loop    Consulted and Agree with Plan of Care Patient             Patient will benefit from skilled therapeutic intervention in order to improve the following deficits and impairments:  Abnormal gait, Decreased activity tolerance, Decreased mobility, Decreased range of motion, Decreased strength, Difficulty walking, Improper body mechanics, Pain  Visit Diagnosis: Acute pain of right knee  Other abnormalities of gait and mobility     Problem List Patient Active Problem List   Diagnosis Date Noted   Amenorrhea 05/01/2020   Plantar fasciitis 09/01/2017   History of ovarian cyst 03/23/2015   History of abnormal cervical Pap smear 03/23/2015   Fibromyalgia 03/23/2015   Hypertension, essential 10/05/2014   Vitiligo 10/05/2014    Toniann Fail, PT 03/21/2021, 5:42 PM  [place] [address] [place], [place], [address] Phone: [phone]   Fax:  [phone]  Name: Virginia Hawkins MRN: [id number] Date of Birth: 1970-11-23

## 2021-03-23 ENCOUNTER — Ambulatory Visit (HOSPITAL_COMMUNITY): Payer: PRIVATE HEALTH INSURANCE | Attending: Family Medicine

## 2021-03-23 ENCOUNTER — Other Ambulatory Visit: Payer: Self-pay

## 2021-03-23 DIAGNOSIS — M25561 Pain in right knee: Secondary | ICD-10-CM | POA: Insufficient documentation

## 2021-03-23 DIAGNOSIS — R2689 Other abnormalities of gait and mobility: Secondary | ICD-10-CM | POA: Diagnosis present

## 2021-03-23 NOTE — Therapy (Signed)
[place], [place], [address] Phone: [phone]   Fax:  [phone]  Physical Therapy Treatment  Patient Details  Name: Isabelle Matt MRN: [id number] Date of Birth: 04-Nov-1970 Referring Provider (PT): Edmonia Lynch, MD   Encounter Date: 03/23/2021   PT End of Session - 03/23/21 1352     Visit Number 18    Number of Visits 29    Date for PT Re-Evaluation 04/06/21    Authorization Type [employer]--This is WC (Claim# [id number]) approved for 1 eval and 8 tx, and then approved 6 visits that have to be used by 02/22/21 (fax all progress notes to [phone]); approved 2x/week for 4 weeks up until 03/26/21    Authorization - Visit Number 4    Authorization - Number of Visits 16    Progress Note Due on Visit 19    PT Start Time 2025    PT Stop Time 1430    PT Time Calculation (min) 45 min    Activity Tolerance Patient limited by pain    Behavior During Therapy [facility] for tasks assessed/performed             Past Medical History:  Diagnosis Date   Fibromyalgia    Hypertension    Ovarian cyst    Vaginal Pap smear, abnormal     Past Surgical History:  Procedure Laterality Date   CYST EXCISION     FOOT SURGERY Left 2020   for plantar fasciitis   SALPINGECTOMY      There were no vitals filed for this visit.   Subjective Assessment - 03/23/21 1354     Subjective Pt notes not much change in right knee pain symptoms and she has been working hospital floor for longer duration    Limitations Sitting;Lifting;Standing;Walking;House hold activities    How long can you sit comfortably? aching with prolonged sitting    How long can you stand comfortably? 15 min    How long can you walk comfortably? less than 15 minutes    Patient Stated Goals To reduce pain.    Currently in Pain? Yes    Pain Score 7     Pain Location Knee    Pain Orientation Right    Pain Descriptors / Indicators Sore    Pain  Type Acute pain    Pain Onset 1 to 4 weeks ago                [facility] PT Assessment - 03/23/21 0001       Assessment   Medical Diagnosis rt anterior knee pain                           [facility] Adult PT Treatment/Exercise - 03/23/21 0001       Ambulation/Gait   Ambulation/Gait Yes    Ambulation/Gait Assistance 7: Independent    Assistive device None    Gait Pattern Within Functional Limits    Ambulation Surface Level;Indoor    Gait velocity 1.5-1.8 mph    Gait Comments Gait on treadmill to improve velocity and tolerating well, normalized pat      Knee/Hip Exercises: Standing   Knee Flexion Strengthening;Both;3 sets;10 reps    Knee Flexion Limitations 5#    Terminal Knee Extension Strengthening;Right;3 sets;10 reps    Rocker Board 4 minutes    Rocker Board Limitations 2 min lateral, 2 min ant-poster    Other Standing Knee Exercises sidestepping x  2 min 5# RLE    Other Standing Knee Exercises mini-squat on airex with ball toss/bounce 2x20 reps lareg/small      Knee/Hip Exercises: Seated   Long Arc Quad Strengthening;3 sets;10 reps    Long Arc Quad Weight 5 lbs.    Long Arc Quad Limitations 3 sec hold    Ball Squeeze 3x10 3 sec hold                       PT Short Term Goals - 02/28/21 1740       PT SHORT TERM GOAL #1   Title Patient will be IND with initial HEP to improve functional outcomes    Baseline Independent in open chain PRE and static stretching routine for RLE    Time 2    Period Weeks    Status Achieved    Target Date 01/17/21      PT SHORT TERM GOAL #2   Title Patient will report at least 25% improvement in symptoms for improved quality of life.    Baseline 10% improvement. Continues to report 8/10 pain in RLE/groin    Time 2    Period Weeks    Status On-going    Target Date 03/28/21      PT SHORT TERM GOAL #3   Title Demo right hip flexion to 120 degrees to improve functional mobility    Baseline 120    Time 2     Period Weeks    Status Achieved    Target Date 01/17/21               PT Long Term Goals - 02/28/21 1741       PT LONG TERM GOAL #1   Title Ambulate with normalized gait pattern over various surfaces x 6 minutes without increase in pain    Baseline normalized pattern 9/10 pain    Time 5    Period Weeks    Status Achieved    Target Date 03/06/21      PT LONG TERM GOAL #2   Title Manifest improved RLE strength and activity tolerance as evidenced by reciprocal stair ambulation    Baseline reciprocal pattern with HR    Time 5    Period Weeks    Status Achieved    Target Date 03/06/21      PT LONG TERM GOAL #3   Title Patient will report at least 75% improvement in symptoms for improved quality of life.    Baseline 10% per patient report    Time 4    Period Weeks    Status On-going    Target Date 03/28/21                   Plan - 03/23/21 1427     Clinical Impression Statement Tolerating increased resistance exercises without adverse effects and ambulating with increased velocity and demonstrating normalized mechanics with symmetric stride length and no antalgia appreciated.  Continued difficulty in achieving deep squat position from knee pain but noted improved loading response RLE with less deviation. Continued sessions to improve RLE strength and function    Personal Factors and Comorbidities Time since onset of injury/illness/exacerbation;Comorbidity 1    Comorbidities pt notes past knee injuries and orthopedic issues    Examination-Activity Limitations Bend;Carry;Lift;Stand;Stairs;Squat;Sleep;Locomotion Level;Transfers    Examination-Participation Restrictions Cleaning;Community Activity;Driving;Occupation;Meal Prep    Stability/Clinical Decision Making Stable/Uncomplicated    Rehab Potential Good    PT Frequency 2x / week  PT Duration 4 weeks    PT Treatment/Interventions ADLs/Self Care Home Management;Aquatic Therapy;Electrical Stimulation;DME  Instruction;Traction;Moist Solicitor;Therapeutic activities;Therapeutic exercise;Balance training;Neuromuscular re-education;Patient/family education;Passive range of motion;Dry needling;Taping;Spinal Manipulations;Joint Manipulations;Manual techniques    PT Next Visit Plan F/U wiht additional closed chain exercises,.  progress to open chain strength for knee, continue with hip assessment and activity as tolerated    PT Home Exercise Plan SAQ, supine clam shells in hooklying, ball squeeze, sidestepping with green t-loop    Consulted and Agree with Plan of Care Patient             Patient will benefit from skilled therapeutic intervention in order to improve the following deficits and impairments:  Abnormal gait, Decreased activity tolerance, Decreased mobility, Decreased range of motion, Decreased strength, Difficulty walking, Improper body mechanics, Pain  Visit Diagnosis: Acute pain of right knee  Other abnormalities of gait and mobility     Problem List Patient Active Problem List   Diagnosis Date Noted   Amenorrhea 05/01/2020   Plantar fasciitis 09/01/2017   History of ovarian cyst 03/23/2015   History of abnormal cervical Pap smear 03/23/2015   Fibromyalgia 03/23/2015   Hypertension, essential 10/05/2014   Vitiligo 10/05/2014    Toniann Fail, PT 03/23/2021, 2:29 PM  [place] [address] [place], [place], [address] Phone: [phone]   Fax:  [phone]  Name: Aithana Kushner MRN: [id number] Date of Birth: Aug 04, 1970

## 2021-03-27 ENCOUNTER — Other Ambulatory Visit (HOSPITAL_COMMUNITY): Payer: Self-pay

## 2021-03-28 ENCOUNTER — Other Ambulatory Visit: Payer: Self-pay

## 2021-03-28 ENCOUNTER — Encounter (HOSPITAL_COMMUNITY): Payer: Self-pay

## 2021-03-28 ENCOUNTER — Ambulatory Visit (HOSPITAL_COMMUNITY): Payer: PRIVATE HEALTH INSURANCE | Attending: Family Medicine

## 2021-03-28 DIAGNOSIS — M25561 Pain in right knee: Secondary | ICD-10-CM | POA: Diagnosis present

## 2021-03-28 DIAGNOSIS — R29898 Other symptoms and signs involving the musculoskeletal system: Secondary | ICD-10-CM

## 2021-03-28 DIAGNOSIS — R2689 Other abnormalities of gait and mobility: Secondary | ICD-10-CM | POA: Diagnosis present

## 2021-03-28 NOTE — Therapy (Signed)
[place], [place], [address] Phone: [phone]   Fax:  [phone]  Physical Therapy Treatment  Patient Details  Name: Virginia Hawkins MRN: [id number] Date of Birth: 07-05-1970 Referring Provider (PT): Edmonia Lynch, MD   Encounter Date: 03/28/2021   PT End of Session - 03/28/21 1728     Visit Number 19    Number of Visits 29    Date for PT Re-Evaluation 04/06/21    Authorization Type [employer]--This is WC (Claim# [id number]) approved for 1 eval and 8 tx, and then approved 6 visits that have to be used by 02/22/21 (fax all progress notes to [phone]); approved 2x/week for 4 weeks up until 03/26/21    Authorization - Visit Number 7    Authorization - Number of Visits 8    Progress Note Due on Visit 19    PT Start Time 1700    PT Stop Time 1738    PT Time Calculation (min) 38 min    Activity Tolerance Patient limited by pain    Behavior During Therapy [facility] for tasks assessed/performed             Past Medical History:  Diagnosis Date   Fibromyalgia    Hypertension    Ovarian cyst    Vaginal Pap smear, abnormal     Past Surgical History:  Procedure Laterality Date   CYST EXCISION     FOOT SURGERY Left 2020   for plantar fasciitis   SALPINGECTOMY      There were no vitals filed for this visit.   Subjective Assessment - 03/28/21 1703     Subjective Pt reports she has good and bad days dependent upon what she does during the day.    Patient Stated Goals To reduce pain.    Currently in Pain? Yes    Pain Score 6     Pain Location Leg    Pain Orientation Right   knee and groin   Pain Descriptors / Indicators Sharp    Pain Type Acute pain    Pain Radiating Towards right groin/thigh    Pain Onset 1 to 4 weeks ago    Pain Frequency Intermittent    Aggravating Factors  walking, stair, bending, squatting    Pain Relieving Factors heat pad, elevation and rubbing    Effect of Pain  on Daily Activities limits                               [facility] Adult PT Treatment/Exercise - 03/28/21 0001       Ambulation/Gait   Ambulation/Gait Yes    Ambulation/Gait Assistance 7: Independent    Assistive device None    Gait Pattern Within Functional Limits    Ambulation Surface Level;Indoor    Gait velocity 1.5-1.8 mph    Gait Comments Gait on treadmill to improve velocity and tolerating well, normalized pat      Knee/Hip Exercises: Standing   Heel Raises Both;3 sets;10 reps    Heel Raises Limitations on airex    Knee Flexion Strengthening;Both;3 sets;10 reps    Knee Flexion Limitations 5#, stopped early due to c/o groin pain    Rocker Board Limitations 2 min lateral, 2 min DF/PF; 30" x 2 balance    Other Standing Knee Exercises sidestepping x 2 min 5# RLE    Other Standing Knee Exercises mini-squat on airex with ball toss/bounce 2x20 reps lareg/small  Knee/Hip Exercises: Seated   Long Arc Quad Strengthening;3 sets;10 reps    Long Arc Quad Weight 5 lbs.    Long Arc Quad Limitations 3 sec hold                       PT Short Term Goals - 03/28/21 1730       PT SHORT TERM GOAL #1   Title Patient will be IND with initial HEP to improve functional outcomes    Baseline Independent in open chain PRE and static stretching routine for RLE    Status Achieved      PT SHORT TERM GOAL #2   Title Patient will report at least 25% improvement in symptoms for improved quality of life.    Baseline 03/28/21: 40% improvements, continues to be limited by pain.  10% improvement. Continues to report 8/10 pain in RLE/groin    Status Achieved      PT SHORT TERM GOAL #3   Title Demo right hip flexion to 120 degrees to improve functional mobility    Baseline 120    Status Achieved               PT Long Term Goals - 03/28/21 1732       PT LONG TERM GOAL #1   Title Ambulate with normalized gait pattern over various surfaces x 6 minutes without  increase in pain    Baseline normalized pattern 9/10 pain    Status Partially Met      PT LONG TERM GOAL #2   Title Manifest improved RLE strength and activity tolerance as evidenced by reciprocal stair ambulation    Baseline reciprocal pattern with HR    Status Achieved      PT LONG TERM GOAL #3   Title Patient will report at least 75% improvement in symptoms for improved quality of life.    Baseline 03/28/21: reports 40% improvements; was: 10% per patient report    Status On-going                   Plan - 03/28/21 1747     Clinical Impression Statement Pt continues to be limited with pain Rt knee and groin.  Reports of increased pain with increased cadence, does present with normalized gait mechanics on treadmill though gait deficits return when off of treadmill.  Verbal cueing and demonstration required with squats to improve mechanics and appopriate loading response.  Reviewed goals with 3/3 STGs met, and 1/3 LTGs met. Reports she feels improvements by 40%.    Personal Factors and Comorbidities Time since onset of injury/illness/exacerbation;Comorbidity 1    Comorbidities pt notes past knee injuries and orthopedic issues    Examination-Activity Limitations Bend;Carry;Lift;Stand;Stairs;Squat;Sleep;Locomotion Level;Transfers    Examination-Participation Restrictions Cleaning;Community Activity;Driving;Occupation;Meal Prep    Stability/Clinical Decision Making Stable/Uncomplicated    Clinical Decision Making Low    Rehab Potential Good    PT Frequency 2x / week    PT Duration 4 weeks    PT Treatment/Interventions ADLs/Self Care Home Management;Aquatic Therapy;Electrical Stimulation;DME Instruction;Traction;Moist Solicitor;Therapeutic activities;Therapeutic exercise;Balance training;Neuromuscular re-education;Patient/family education;Passive range of motion;Dry needling;Taping;Spinal Manipulations;Joint Manipulations;Manual techniques    PT Next Visit  Plan F/U wiht additional closed chain exercises,.  progress to open chain strength for knee, continue with hip assessment and activity as tolerated    PT Home Exercise Plan SAQ, supine clam shells in hooklying, ball squeeze, sidestepping with green t-loop    Consulted and Agree with Plan of Care Patient  Patient will benefit from skilled therapeutic intervention in order to improve the following deficits and impairments:  Abnormal gait, Decreased activity tolerance, Decreased mobility, Decreased range of motion, Decreased strength, Difficulty walking, Improper body mechanics, Pain  Visit Diagnosis: Acute pain of right knee  Other abnormalities of gait and mobility  Other symptoms and signs involving the musculoskeletal system     Problem List Patient Active Problem List   Diagnosis Date Noted   Amenorrhea 05/01/2020   Plantar fasciitis 09/01/2017   History of ovarian cyst 03/23/2015   History of abnormal cervical Pap smear 03/23/2015   Fibromyalgia 03/23/2015   Hypertension, essential 10/05/2014   Vitiligo 10/05/2014   Ihor Austin, LPTA/CLT; CBIS [phone]  Aldona Lento, PTA 03/28/2021, 7:04 PM  [place], [place], [address] Phone: [phone]   Fax:  [phone]  Name: Micalah Cabezas MRN: [id number] Date of Birth: December 01, 1970

## 2021-03-30 ENCOUNTER — Encounter (HOSPITAL_COMMUNITY): Payer: Self-pay | Admitting: Physical Therapy

## 2021-03-30 ENCOUNTER — Other Ambulatory Visit: Payer: Self-pay

## 2021-03-30 ENCOUNTER — Ambulatory Visit (HOSPITAL_COMMUNITY): Payer: PRIVATE HEALTH INSURANCE | Admitting: Physical Therapy

## 2021-03-30 DIAGNOSIS — M25561 Pain in right knee: Secondary | ICD-10-CM | POA: Diagnosis not present

## 2021-03-30 DIAGNOSIS — R29898 Other symptoms and signs involving the musculoskeletal system: Secondary | ICD-10-CM

## 2021-03-30 DIAGNOSIS — R2689 Other abnormalities of gait and mobility: Secondary | ICD-10-CM

## 2021-03-30 NOTE — Therapy (Addendum)
[place], [place], [address] Phone: [phone]   Fax:  [phone]  Physical Therapy Treatment  Patient Details  Name: Virginia Hawkins MRN: [id number] Date of Birth: Apr 26, 1970 Referring Provider (PT): Edmonia Lynch, MD   Encounter Date: 03/30/2021 PHYSICAL THERAPY DISCHARGE SUMMARY  Visits from Start of Care: 20  Current functional level related to goals / functional outcomes: PT ROM is wfl; strength is functional    Remaining deficits: Pt continues to state that she has pain with exercises and functional activity.  Deficits are  More subjective at this time.   Education / Equipment: HEP   Patient agrees to discharge. Patient goals were partially met. Patient is being discharged due to did not respond to therapy.     PT End of Session - 03/30/21 1343     Visit Number 20    Number of Visits 20   Date for PT Re-Evaluation 04/06/21    Authorization Type [facility]--This is WC (Claim# [id number]) approved for 1 eval and 8 tx, and then approved 6 visits that have to be used by 02/22/21 (fax all progress notes to [phone]); approved 2x/week for 4 weeks up until 03/26/21; twice week til 04/06/21    Authorization - Visit Number 20    Authorization - Number of Visits 20   Progress Note Due on Visit 19    PT Start Time 1315    PT Stop Time 1400    PT Time Calculation (min) 45 min    Activity Tolerance Patient limited by pain    Behavior During Therapy WFL for tasks assessed/performed             Past Medical History:  Diagnosis Date   Fibromyalgia    Hypertension    Ovarian cyst    Vaginal Pap smear, abnormal     Past Surgical History:  Procedure Laterality Date   CYST EXCISION     FOOT SURGERY Left 2020   for plantar fasciitis   SALPINGECTOMY      There were no vitals filed for this visit.   Subjective Assessment - 03/30/21 1339     Subjective PT states her next MD visit is  on the 16th.  Pt states that she feels therapy has helped 40% but it is aggrevating her hip.  Pt states that she feels worse after leaving therapy.  Pt states that she has increase pain in her hip as well, she is seeing a different MD for her hip but feels that the hip needs to be addressed before the knee can feel better.  Therapist suggested it might be better for one MD to be looking at the hip and knee so the right hand would know what the left hand is doing but pt appeared to get upset with this suggestion.     Limitations Sitting;Lifting;Standing;Walking;House hold activities    How long can you sit comfortably? aching with prolonged sitting    How long can you stand comfortably? 15 min    How long can you walk comfortably? less than 15 minutes    Patient Stated Goals To reduce pain.    Pain Onset 1 to 4 weeks ago                [facility] PT Assessment - 03/30/21 0001       Assessment   Medical Diagnosis rt anterior knee pain    Referring Provider (PT) Edmonia Lynch, MD  Home Environment   Living Environment Private residence      Prior Function   Level of Independence Independent    Vocation Full time employment    Vocation Requirements Phlebotomist      AROM   Right Hip Extension 10    Right Hip Flexion 102    Right Hip External Rotation  30    Right Hip Internal Rotation  35    Right Hip ABduction 35    Right Knee Extension 0    Right Knee Flexion 128   was 120     Strength   Right Hip Flexion 4/5   was 4-   Right Hip Extension 4/5 was 4/5   Right Hip ABduction 4/5   was 3+   Right Hip ADduction 4/5    Right Knee Flexion 4/5 was 3+   Right Knee Extension 4/5   LT is 4/5     Palpation   Patella mobility no issue, no lateral tilt or J-sign appreciated      Ambulation/Gait   Ambulation/Gait Yes    Ambulation/Gait Assistance 7: Independent    Ambulation Distance (Feet) 339 Feet    Stairs Yes    Stairs Assistance 6: Modified independent (Device/Increase  time)    Stair Management Technique    Gait Comments 2 MWT                           [facility] Adult PT Treatment/Exercise - 03/30/21 0001       Knee/Hip Exercises: Standing   Functional Squat 10 reps      Knee/Hip Exercises: Seated   Long Arc Quad Strengthening;3 sets;10 reps    Long Arc Quad Weight 0 lbs.   second set 5# x 10   Sit to Sand 10 reps      Knee/Hip Exercises: Supine   Bridges 10 reps    Straight Leg Raises Right;10 reps      Knee/Hip Exercises: Sidelying   Hip ABduction Right;10 reps      Knee/Hip Exercises: Prone   Hip Extension Strengthening;Right                       PT Short Term Goals - 03/30/21 1403       PT SHORT TERM GOAL #1   Title Patient will be IND with initial HEP to improve functional outcomes    Baseline Independent in open chain PRE and static stretching routine for RLE    Status Achieved      PT SHORT TERM GOAL #2   Title Patient will report at least 25% improvement in symptoms for improved quality of life.    Baseline 03/28/21: 40% improvements, continues to be limited by pain.  10% improvement. Continues to report 8/10 pain in RLE/groin    Status Achieved      PT SHORT TERM GOAL #3   Title Demo right hip flexion to 120 degrees to improve functional mobility    Baseline 120    Status Achieved               PT Long Term Goals - 03/28/21 1732       PT LONG TERM GOAL #1   Title Ambulate with normalized gait pattern over various surfaces x 6 minutes without increase in pain    Baseline normalized pattern 9/10 pain    Status Partially Met      PT LONG TERM GOAL #2  Title Manifest improved RLE strength and activity tolerance as evidenced by reciprocal stair ambulation    Baseline reciprocal pattern with HR    Status Achieved      PT LONG TERM GOAL #3   Title Patient will report at least 75% improvement in symptoms for improved quality of life.    Baseline 03/28/21: reports 40% improvements; was:  10% per patient report    Status On-going                   Plan - 03/30/21 1358     Clinical Impression Statement Pt continues to state she is in more pain following session. Pt certification is due therefore pt reassessed.  PT mm test limited by pain, pt did not appear to give full effort seconday to pain but still shows improvement.  Pt observed walking into department at a fast pace which she was unable to perform during the 2 MWT due to increased pain following mmt.  Therapist suggested stopping therapy as pt feels this flares her pain up.  PT is approved thru next week with workman compensation. Pt seems to be upset by therapist discharging pt; therapist explained that if in 20 visits she continually been irritated by therapy next weeks visits will most likely be the same and that will give a week without therapy for the pt to report to her MD on her upcoming visit.   PT HEP updated, therapist recommended pt completing exercises on her good days as she needs to improve her strength.  PT appears frustrated by discharge but at the same time states therapy is aggrevating.  Await MD evaluation and recommendation.    Personal Factors and Comorbidities Time since onset of injury/illness/exacerbation;Comorbidity 1    Comorbidities pt notes past knee injuries and orthopedic issues    Examination-Activity Limitations Bend;Carry;Lift;Stand;Stairs;Squat;Sleep;Locomotion Level;Transfers    Examination-Participation Restrictions Cleaning;Community Activity;Driving;Occupation;Meal Prep    Stability/Clinical Decision Making Stable/Uncomplicated    Rehab Potential Good    PT Frequency 2x / week    PT Duration 4 weeks    PT Treatment/Interventions ADLs/Self Care Home Management;Aquatic Therapy;Electrical Stimulation;DME Instruction;Traction;Moist Solicitor;Therapeutic activities;Therapeutic exercise;Balance training;Neuromuscular re-education;Patient/family education;Passive  range of motion;Dry needling;Taping;Spinal Manipulations;Joint Manipulations;Manual techniques    PT Next Visit Plan F/U wiht additional closed chain exercises,.  progress to open chain strength for knee, continue with hip assessment and activity as tolerated    PT Home Exercise Plan SAQ, supine clam shells in hooklying, ball squeeze, sidestepping with green t-loop; 1/6: SLR, bridge,LAQ, sit to stand and squat    Consulted and Agree with Plan of Care Patient             Patient will benefit from skilled therapeutic intervention in order to improve the following deficits and impairments:  Abnormal gait, Decreased activity tolerance, Decreased mobility, Decreased range of motion, Decreased strength, Difficulty walking, Improper body mechanics, Pain  Visit Diagnosis: Acute pain of right knee  Other abnormalities of gait and mobility  Other symptoms and signs involving the musculoskeletal system     Problem List Patient Active Problem List   Diagnosis Date Noted   Amenorrhea 05/01/2020   Plantar fasciitis 09/01/2017   History of ovarian cyst 03/23/2015   History of abnormal cervical Pap smear 03/23/2015   Fibromyalgia 03/23/2015   Hypertension, essential 10/05/2014   Vitiligo 10/05/2014  Rayetta Humphrey, PT CLT [phone]  03/30/2021, 2:04 PM  [place], [place], [address] Phone: [phone]  Fax:  [phone]  Name: Jennavie Martinek MRN: [id number] Date of Birth: 09/15/1970

## 2021-04-04 ENCOUNTER — Ambulatory Visit (HOSPITAL_COMMUNITY): Payer: PRIVATE HEALTH INSURANCE

## 2021-04-06 ENCOUNTER — Encounter (HOSPITAL_COMMUNITY): Payer: 59

## 2021-04-17 DIAGNOSIS — M24151 Other articular cartilage disorders, right hip: Secondary | ICD-10-CM | POA: Diagnosis not present

## 2021-04-17 DIAGNOSIS — M25851 Other specified joint disorders, right hip: Secondary | ICD-10-CM | POA: Diagnosis not present

## 2021-04-23 ENCOUNTER — Other Ambulatory Visit (HOSPITAL_COMMUNITY): Payer: Self-pay

## 2021-04-27 ENCOUNTER — Other Ambulatory Visit (HOSPITAL_COMMUNITY): Payer: Self-pay

## 2021-04-27 DIAGNOSIS — L02412 Cutaneous abscess of left axilla: Secondary | ICD-10-CM | POA: Diagnosis not present

## 2021-04-27 DIAGNOSIS — G4700 Insomnia, unspecified: Secondary | ICD-10-CM | POA: Diagnosis not present

## 2021-04-27 MED ORDER — AMOXICILLIN-POT CLAVULANATE 875-125 MG PO TABS
1.0000 | ORAL_TABLET | Freq: Two times a day (BID) | ORAL | 0 refills | Status: AC
  Filled 2021-04-27: qty 20, 10d supply, fill #0

## 2021-04-27 MED ORDER — ZOLPIDEM TARTRATE 10 MG PO TABS
10.0000 mg | ORAL_TABLET | Freq: Every evening | ORAL | 1 refills | Status: DC | PRN
Start: 2021-04-27 — End: 2022-11-01
  Filled 2021-04-27: qty 30, 30d supply, fill #0

## 2021-04-27 MED ORDER — IBUPROFEN 800 MG PO TABS
800.0000 mg | ORAL_TABLET | Freq: Three times a day (TID) | ORAL | 1 refills | Status: DC | PRN
  Filled 2021-04-27: qty 30, 10d supply, fill #0

## 2021-05-09 DIAGNOSIS — M797 Fibromyalgia: Secondary | ICD-10-CM | POA: Diagnosis not present

## 2021-05-09 DIAGNOSIS — M24151 Other articular cartilage disorders, right hip: Secondary | ICD-10-CM | POA: Diagnosis not present

## 2021-05-09 DIAGNOSIS — I10 Essential (primary) hypertension: Secondary | ICD-10-CM | POA: Diagnosis not present

## 2021-05-09 DIAGNOSIS — M25851 Other specified joint disorders, right hip: Secondary | ICD-10-CM | POA: Diagnosis not present

## 2021-05-09 DIAGNOSIS — Z01818 Encounter for other preprocedural examination: Secondary | ICD-10-CM | POA: Diagnosis not present

## 2021-05-09 DIAGNOSIS — M76891 Other specified enthesopathies of right lower limb, excluding foot: Secondary | ICD-10-CM | POA: Diagnosis not present

## 2021-05-23 ENCOUNTER — Other Ambulatory Visit (HOSPITAL_COMMUNITY): Payer: Self-pay

## 2021-05-23 DIAGNOSIS — M25551 Pain in right hip: Secondary | ICD-10-CM | POA: Diagnosis not present

## 2021-05-23 DIAGNOSIS — Z79899 Other long term (current) drug therapy: Secondary | ICD-10-CM | POA: Diagnosis not present

## 2021-05-23 DIAGNOSIS — M24151 Other articular cartilage disorders, right hip: Secondary | ICD-10-CM | POA: Diagnosis not present

## 2021-05-23 DIAGNOSIS — G8918 Other acute postprocedural pain: Secondary | ICD-10-CM | POA: Diagnosis not present

## 2021-05-23 DIAGNOSIS — M94251 Chondromalacia, right hip: Secondary | ICD-10-CM | POA: Diagnosis not present

## 2021-05-23 DIAGNOSIS — I10 Essential (primary) hypertension: Secondary | ICD-10-CM | POA: Diagnosis not present

## 2021-05-23 DIAGNOSIS — Z8342 Family history of familial hypercholesterolemia: Secondary | ICD-10-CM | POA: Diagnosis not present

## 2021-05-23 DIAGNOSIS — G8929 Other chronic pain: Secondary | ICD-10-CM | POA: Diagnosis not present

## 2021-05-23 DIAGNOSIS — Z833 Family history of diabetes mellitus: Secondary | ICD-10-CM | POA: Diagnosis not present

## 2021-05-23 DIAGNOSIS — M25851 Other specified joint disorders, right hip: Secondary | ICD-10-CM | POA: Diagnosis not present

## 2021-05-23 MED ORDER — NAPROXEN 500 MG PO TABS
500.0000 mg | ORAL_TABLET | Freq: Two times a day (BID) | ORAL | 0 refills | Status: DC
  Filled 2021-05-23: qty 60, 30d supply, fill #0

## 2021-05-23 MED ORDER — ASPIRIN EC 325 MG PO TBEC: 325.0000 mg | DELAYED_RELEASE_TABLET | Freq: Every day | ORAL | 0 refills | Status: DC

## 2021-05-23 MED ORDER — PROMETHAZINE HCL 25 MG PO TABS
12.5000 mg | ORAL_TABLET | Freq: Four times a day (QID) | ORAL | 0 refills | Status: DC | PRN
  Filled 2021-05-23: qty 30, 8d supply, fill #0

## 2021-05-23 MED ORDER — OXYCODONE HCL 5 MG PO TABS
5.0000 mg | ORAL_TABLET | ORAL | 0 refills | Status: DC | PRN
Start: 2021-05-23 — End: 2022-11-01
  Filled 2021-05-23: qty 15, 3d supply, fill #0

## 2021-05-23 MED ORDER — TIZANIDINE HCL 4 MG PO TABS
4.0000 mg | ORAL_TABLET | Freq: Three times a day (TID) | ORAL | 0 refills | Status: DC | PRN
  Filled 2021-05-23: qty 30, 10d supply, fill #0

## 2021-05-23 MED ORDER — OMEPRAZOLE 20 MG PO CPDR
20.0000 mg | DELAYED_RELEASE_CAPSULE | Freq: Every day | ORAL | 0 refills | Status: DC
  Filled 2021-05-23: qty 30, 30d supply, fill #0

## 2021-06-12 ENCOUNTER — Other Ambulatory Visit (HOSPITAL_COMMUNITY): Payer: Self-pay

## 2021-06-12 DIAGNOSIS — Z1331 Encounter for screening for depression: Secondary | ICD-10-CM | POA: Diagnosis not present

## 2021-06-12 DIAGNOSIS — I10 Essential (primary) hypertension: Secondary | ICD-10-CM | POA: Diagnosis not present

## 2021-06-12 DIAGNOSIS — G4700 Insomnia, unspecified: Secondary | ICD-10-CM | POA: Diagnosis not present

## 2021-06-12 MED ORDER — TRAZODONE HCL 50 MG PO TABS
50.0000 mg | ORAL_TABLET | Freq: Every day | ORAL | 11 refills | Status: DC
Start: 2021-06-12 — End: 2022-11-01
  Filled 2021-06-12: qty 60, 30d supply, fill #0
  Filled 2021-08-29: qty 60, 30d supply, fill #1
  Filled 2022-01-27: qty 60, 30d supply, fill #2
  Filled 2022-03-26: qty 60, 30d supply, fill #3

## 2021-06-12 MED ORDER — LOSARTAN POTASSIUM 50 MG PO TABS
50.0000 mg | ORAL_TABLET | Freq: Every day | ORAL | 3 refills | Status: DC
Start: 2021-06-12 — End: 2022-01-03
  Filled 2021-06-12: qty 90, 90d supply, fill #0

## 2021-06-14 ENCOUNTER — Encounter (HOSPITAL_COMMUNITY): Payer: Self-pay | Admitting: Physical Therapy

## 2021-06-14 ENCOUNTER — Ambulatory Visit (HOSPITAL_COMMUNITY): Payer: 59 | Attending: Orthopedic Surgery | Admitting: Physical Therapy

## 2021-06-14 ENCOUNTER — Other Ambulatory Visit: Payer: Self-pay

## 2021-06-14 DIAGNOSIS — M25551 Pain in right hip: Secondary | ICD-10-CM | POA: Insufficient documentation

## 2021-06-14 DIAGNOSIS — R2689 Other abnormalities of gait and mobility: Secondary | ICD-10-CM | POA: Insufficient documentation

## 2021-06-14 DIAGNOSIS — R29898 Other symptoms and signs involving the musculoskeletal system: Secondary | ICD-10-CM | POA: Insufficient documentation

## 2021-06-14 DIAGNOSIS — M6281 Muscle weakness (generalized): Secondary | ICD-10-CM | POA: Insufficient documentation

## 2021-06-14 NOTE — Therapy (Signed)
?OUTPATIENT PHYSICAL THERAPY LOWER EXTREMITY EVALUATION ? ? ?Patient Name: Virginia Hawkins ?MRN: [id number] ?DOB:25-Aug-1970, 51 y.o., female ?Today's Date: 06/14/2021 ? ? ? ?Past Medical History:  ?Diagnosis Date  ? Fibromyalgia   ? Hypertension   ? Ovarian cyst   ? Vaginal Pap smear, abnormal   ? ?Past Surgical History:  ?Procedure Laterality Date  ? CYST EXCISION    ? FOOT SURGERY Left 2020  ? for plantar fasciitis  ? SALPINGECTOMY    ? ?Patient Active Problem List  ? Diagnosis Date Noted  ? Amenorrhea 05/01/2020  ? Plantar fasciitis 09/01/2017  ? History of ovarian cyst 03/23/2015  ? History of abnormal cervical Pap smear 03/23/2015  ? Fibromyalgia 03/23/2015  ? Hypertension, essential 10/05/2014  ? Vitiligo 10/05/2014  ? ? ?PCP: Noralee Stain, MD ? ?REFERRING PROVIDER: Rosalio Macadamia, MD ? ?REFERRING DIAG: W43.154 s/p arthroscopy of hip ? ?THERAPY DIAG:  ?No diagnosis found. ? ?ONSET DATE: DOS 05/23/21 ? ?SUBJECTIVE:  ? ?SUBJECTIVE STATEMENT: ?Hip has been doing alright. Doesn't like crutches. Has to wear brace for 6 weeks. Goes back April 24 to see MD. ? ?PERTINENT HISTORY: ?Fall Sept. 2022 ? ?PAIN:  ?Are you having pain? Yes: NPRS scale: 7/10 ?Pain location: R anterior hip ?Pain description: burning/ache ?Aggravating factors: movement ?Relieving factors: laying/sitting/changing positions ? ?PRECAUTIONS: Other: Arthroscopic hip labral repair with femoral osteoplasty - see protocol for guideline and precautions in media ? ?WEIGHT BEARING RESTRICTIONS Yes Week 1-4 50% flat foot weight bearing, WBAT >4 weeks ? ?FALLS:  ?Has patient fallen in last 6 months? Yes, Number of falls: 1 ? ?LIVING ENVIRONMENT: ?Lives with: lives with their spouse ?Lives in: House/apartment ?Stairs: Yes: Internal: 12-15 steps; on right going up ?Has following equipment at home: Single point cane and Crutches ? ?OCCUPATION: phlebotomy  ? ?PLOF: Independent ? ?PATIENT GOALS to walk better and full mobility ? ? ?OBJECTIVE:   ? ? ? ?PATIENT SURVEYS:  ?FOTO not uploaded - complete next session ? ?COGNITION: ? Overall cognitive status: Within functional limits for tasks assessed   ?  ?SENSATION: ?WFL ? ? ?POSTURE:  ?Slouched in seated ? ?PALPATION: ?TTP R hip flexors ? ?LE ROM: ? ?Passive ROM Right ?06/14/2021 Left ?06/14/2021  ?Hip flexion 90   ?Hip extension Lacking 7   ?Hip abduction    ?Hip adduction    ?Hip internal rotation    ?Hip external rotation    ?Knee flexion    ?Knee extension    ?Ankle dorsiflexion    ?Ankle plantarflexion    ?Ankle inversion    ?Ankle eversion    ? (Blank rows = not tested) ? ?LE MMT: ? ?MMT Right ?06/14/2021 Left ?06/14/2021  ?Hip flexion  5/5  ?Hip extension    ?Hip abduction    ?Hip adduction    ?Hip internal rotation    ?Hip external rotation    ?Knee flexion 5/5 5/5  ?Knee extension 4+/5 5/5  ?Ankle dorsiflexion 5/5 5/5  ?Ankle plantarflexion    ?Ankle inversion    ?Ankle eversion    ? (Blank rows = not tested) ? ? ? ?FUNCTIONAL TESTS:  ?Transfers: labored, unsteady ? ?GAIT: ?Distance walked: 100 ?Assistive device utilized: Crutches ?Level of assistance: Modified independence ?Comments: trunk flexed, antalgic on crutches, flexed at hip ? ? ? ?TODAY'S TREATMENT: ?06/14/21 ?Quad set 10 x 10 second holds ?Glute sets 10 x 10 second holds ?TRA activation 10 x 5 second holds 10 x 5 second holds ?Hip abduction and adduction isometrics with belt  and ball 10x 10 second holds each ? ? ?PATIENT EDUCATION:  ?Education details: Patient educated on exam findings, POC, scope of PT, HEP, and protocol and proper crutch height and use. ?Person educated: Patient ?Education method: Explanation, Demonstration, and Handouts ?Education comprehension: verbalized understanding and returned demonstration ? ? ?HOME EXERCISE PROGRAM: ?Quad set, glute set, TRA activation, hip abduction/adduction iso ? ?ASSESSMENT: ? ?CLINICAL IMPRESSION: ?Patient a 51 y.o. y.o. female who was seen today for physical therapy evaluation and treatment  for s/p R hip labral repair. Patient will continue to benefit from physical therapy in order to improve function and reduce impairment. ? ? ? ? ?OBJECTIVE IMPAIRMENTS Abnormal gait, decreased activity tolerance, decreased balance, decreased knowledge of use of DME, difficulty walking, decreased ROM, decreased strength, impaired perceived functional ability, increased muscle spasms, impaired flexibility, improper body mechanics, and pain.  ? ?ACTIVITY LIMITATIONS cleaning, community activity, driving, meal prep, occupation, laundry, yard work, shopping, and school.  ? ?PERSONAL FACTORS Behavior pattern, Past/current experiences, and Time since onset of injury/illness/exacerbation are also affecting patient's functional outcome.  ? ? ?REHAB POTENTIAL: Good ? ?CLINICAL DECISION MAKING: Stable/uncomplicated ? ?EVALUATION COMPLEXITY: Low ? ?GOALS: ?Goals reviewed with patient? Yes ? ?SHORT TERM GOALS: Target date: 07/12/2021 ? ?Patient will be independent with HEP in order to improve functional outcomes. ?Baseline:  ?Goal status: INITIAL ? ?2.  Patient will report at least 25% improvement in symptoms for improved quality of life. ?Baseline:  ?Goal status: INITIAL ? ?3.  Patient will ambulate with full weight bearing without pain with normalized mechanics for progression to next phase of protocol.  ?Baseline:  ?Goal status: INITIAL ? ? ?LONG TERM GOALS: Target date: 08/09/2021 ? ?Patient will report at least 75% improvement in symptoms for improved quality of life. ?Baseline:  ?Goal status: INITIAL ? ?2.  Patient will improve FOTO score by at least 20 points in order to indicate improved tolerance to activity. ?Baseline: complete next session ?Goal status: INITIAL ? ?3.  Patient will be able to navigate stairs with reciprocal pattern without compensation in order to demonstrate improved LE strength. ?Baseline:  ?Goal status: INITIAL ? ?4.  Patient will demonstrate at least 4-/5 hip flexor MMT and hip abduction,extension,  IR/ER of 4/5 per protocol to demonstrate improved strength for ADL.  ?Baseline:  ?Goal status: INITIAL ? ? ? ? ?PLAN: ?PT FREQUENCY: 2x/week ? ?PT DURATION: 8 weeks ? ?PLANNED INTERVENTIONS: Therapeutic exercises, Therapeutic activity, Neuromuscular re-education, Balance training, Gait training, Patient/Family education, Joint manipulation, Joint mobilization, Stair training, Orthotic/Fit training, DME instructions, Aquatic Therapy, Dry Needling, Electrical stimulation, Spinal manipulation, Spinal mobilization, Cryotherapy, Moist heat, Compression bandaging, scar mobilization, Splintting, Taping, Traction, Ultrasound, Ionotophoresis '4mg'$ /ml Dexamethasone, and Manual therapy ? ? ?PLAN FOR NEXT SESSION: complete FOTO, follow patient protocol and progress when able; patient 3 weeks post op, DOS 05/23/21 ? ? ?3:08 PM, 06/14/21 ?Mearl Latin PT, DPT ?Physical Therapist at [facility] ?[facility] ? ?

## 2021-06-14 NOTE — Patient Instructions (Signed)
?  Access Code: [email or web address] ?URL: [email or web address] ?Date: 06/14/2021 ?Prepared by: Mitzi Hansen Tashawna Thom ? ?Exercises ?- Supine Quadricep Sets  - 3 x daily - 7 x weekly - 10 reps - 10 second hold ?- Hooklying Gluteal Sets  - 3 x daily - 7 x weekly - 10 reps - 10 second  hold ?- Abdominal Bracing  - 3 x daily - 7 x weekly - 10 reps - 5 second  hold ?- Supine Hip Adduction Isometric with Ball  - 3 x daily - 7 x weekly - 10 reps - 10 hold ?- Hooklying Isometric Hip Abduction with Belt  - 3 x daily - 7 x weekly - 10 reps - 10 second  hold ? ?

## 2021-06-18 ENCOUNTER — Other Ambulatory Visit (HOSPITAL_COMMUNITY): Payer: Self-pay

## 2021-06-20 ENCOUNTER — Encounter (HOSPITAL_COMMUNITY): Payer: Self-pay | Admitting: Physical Therapy

## 2021-06-20 ENCOUNTER — Ambulatory Visit (HOSPITAL_COMMUNITY): Payer: 59 | Admitting: Physical Therapy

## 2021-06-20 ENCOUNTER — Other Ambulatory Visit: Payer: Self-pay

## 2021-06-20 DIAGNOSIS — M6281 Muscle weakness (generalized): Secondary | ICD-10-CM

## 2021-06-20 DIAGNOSIS — R29898 Other symptoms and signs involving the musculoskeletal system: Secondary | ICD-10-CM | POA: Diagnosis not present

## 2021-06-20 DIAGNOSIS — M25551 Pain in right hip: Secondary | ICD-10-CM | POA: Diagnosis not present

## 2021-06-20 DIAGNOSIS — R2689 Other abnormalities of gait and mobility: Secondary | ICD-10-CM

## 2021-06-20 NOTE — Therapy (Signed)
?OUTPATIENT PHYSICAL THERAPY TREATMENT NOTE ? ? ?Patient Name: Virginia Hawkins ?MRN: [id number] ?DOB:1970-04-04, 51 y.o., female ?Today's Date: 06/20/2021 ? ?PCP: Noralee Stain, MD ?REFERRING PROVIDER: Rosalio Macadamia, MD ? ? PT End of Session - 06/20/21 1141   ? ? Visit Number 2   ? Number of Visits 16   ? Date for PT Re-Evaluation 08/09/21   ? Authorization Type [employer] No auth, no VL, med necessity after 25th visit)   ? PT Start Time 1141   ? PT Stop Time 1219   ? PT Time Calculation (min) 38 min   ? Activity Tolerance Patient tolerated treatment well   ? Behavior During Therapy [facility] for tasks assessed/performed   ? ?  ?  ? ?  ? ? ?Past Medical History:  ?Diagnosis Date  ? Fibromyalgia   ? Hypertension   ? Ovarian cyst   ? Vaginal Pap smear, abnormal   ? ?Past Surgical History:  ?Procedure Laterality Date  ? CYST EXCISION    ? FOOT SURGERY Left 2020  ? for plantar fasciitis  ? SALPINGECTOMY    ? ?Patient Active Problem List  ? Diagnosis Date Noted  ? Amenorrhea 05/01/2020  ? Plantar fasciitis 09/01/2017  ? History of ovarian cyst 03/23/2015  ? History of abnormal cervical Pap smear 03/23/2015  ? Fibromyalgia 03/23/2015  ? Hypertension, essential 10/05/2014  ? Vitiligo 10/05/2014  ? ? ?REFERRING DIAG: Z98.890 s/p arthroscopy of hip ? ?THERAPY DIAG:  ?Pain in right hip ? ?Muscle weakness (generalized) ? ?Other abnormalities of gait and mobility ? ?Other symptoms and signs involving the musculoskeletal system ? ?PERTINENT HISTORY: DOS 05/23/21; Fall Sept. 2022 ? ?PRECAUTIONS: PRECAUTIONS: Other: Arthroscopic hip labral repair with femoral osteoplasty - see protocol for guideline and precautions in media ?  ?WEIGHT BEARING RESTRICTIONS Yes Week 1-4 50% flat foot weight bearing, WBAT >4 weeks ? ?SUBJECTIVE: Exercise going well. Has been working on walking.  ? ?PAIN:  ?Are you having pain? Yes: NPRS scale: 8/10 ?Pain location: R anterior hip ?Pain description: pull ?Aggravating factors:  movement ?Relieving factors: rest ? ? ?  ?PATIENT GOALS to walk better and full mobility ?  ?  ?OBJECTIVE:  ?  ?  ?  ?PATIENT SURVEYS:  ?FOTO not uploaded - complete next session ?  ? ?  ?LE ROM: ?  ?Passive ROM Right ?06/14/2021 Left ?06/14/2021  ?Hip flexion 90    ?Hip extension Lacking 7    ?Hip abduction      ?Hip adduction      ?Hip internal rotation      ?Hip external rotation      ?Knee flexion      ?Knee extension      ?Ankle dorsiflexion      ?Ankle plantarflexion      ?Ankle inversion      ?Ankle eversion      ? (Blank rows = not tested) ?  ?LE MMT: ?  ?MMT Right ?06/14/2021 Left ?06/14/2021  ?Hip flexion   5/5  ?Hip extension      ?Hip abduction      ?Hip adduction      ?Hip internal rotation      ?Hip external rotation      ?Knee flexion 5/5 5/5  ?Knee extension 4+/5 5/5  ?Ankle dorsiflexion 5/5 5/5  ?Ankle plantarflexion      ?Ankle inversion      ?Ankle eversion      ? (Blank rows = not tested) ?  ?  ?  ?  FUNCTIONAL TESTS:  ?Transfers: labored, unsteady ?  ?GAIT: ?Distance walked: 100 ?Assistive device utilized: Crutches ?Level of assistance: Modified independence ?Comments: trunk flexed slightly ?  ?  ?  ?TODAY'S TREATMENT: ?06/20/21 ?Supine bend knee fall outs 2x 10 bilateral  ?Heel slides 3x 10 in supine AROM ?Prone heel squeeze 2x 10 5 second holds ?Quadruped rocking 2x 10  ?STS 24 inch STS 1x 10  ?Gait with unilateral crutch/cane 200 feet ? ?06/14/21 ?Quad set 10 x 10 second holds ?Glute sets 10 x 10 second holds ?TRA activation 10 x 5 second holds 10 x 5 second holds ?Hip abduction and adduction isometrics with belt and ball 10x 10 second holds each ?  ?  ?PATIENT EDUCATION:  ?Education details: Patient educated on HEP, gait mechanics ?Person educated: Patient ?Education method: Explanation, Demonstration, and Handouts ?Education comprehension: verbalized understanding and returned demonstration ?  ?  ?HOME EXERCISE PROGRAM: ?Quad set, glute set, TRA activation, hip abduction/adduction iso 3/29 heel  slides, bent knee fall outs, quadruped rocking, prone heel squeeze ?  ?ASSESSMENT: ?  ?CLINICAL IMPRESSION: ?Patient ambulating with improving gait pattern Patient educated on protocol and weight bearing status. Progressed per protocol and patient performing well. Tolerates strengthening and mobility without c/o increased symptoms. Gait performed with unilateral crutch as patient WBAT with cueing for normalized mechanics. Patient performs better with holding crutch handle as a cane as she tends to lean excessively to L.  Patient will continue to benefit from physical therapy in order to improve function and reduce impairment. ? ?  ?  ?  ?  ?OBJECTIVE IMPAIRMENTS Abnormal gait, decreased activity tolerance, decreased balance, decreased knowledge of use of DME, difficulty walking, decreased ROM, decreased strength, impaired perceived functional ability, increased muscle spasms, impaired flexibility, improper body mechanics, and pain.  ?  ?ACTIVITY LIMITATIONS cleaning, community activity, driving, meal prep, occupation, laundry, yard work, shopping, and school.  ?  ?PERSONAL FACTORS Behavior pattern, Past/current experiences, and Time since onset of injury/illness/exacerbation are also affecting patient's functional outcome.  ?  ?  ?REHAB POTENTIAL: Good ?  ?CLINICAL DECISION MAKING: Stable/uncomplicated ?  ?EVALUATION COMPLEXITY: Low ?  ?GOALS: ?Goals reviewed with patient? Yes ?  ?SHORT TERM GOALS: Target date: 07/12/2021 ?  ?Patient will be independent with HEP in order to improve functional outcomes. ?Baseline:  ?Goal status: Ongoing ?  ?2.  Patient will report at least 25% improvement in symptoms for improved quality of life. ?Baseline:  ?Goal status: Ongoing ?  ?3.  Patient will ambulate with full weight bearing without pain with normalized mechanics for progression to next phase of protocol.  ?Baseline:  ?Goal status: Ongoing ?  ?  ?LONG TERM GOALS: Target date: 08/09/2021 ?  ?Patient will report at least 75%  improvement in symptoms for improved quality of life. ?Baseline:  ?Goal status: Ongoing ?  ?2.  Patient will improve FOTO score by at least 20 points in order to indicate improved tolerance to activity. ?Baseline: complete next session ?Goal status: Ongoing ?  ?3.  Patient will be able to navigate stairs with reciprocal pattern without compensation in order to demonstrate improved LE strength. ?Baseline:  ?Goal status: Ongoing ?  ?4.  Patient will demonstrate at least 4-/5 hip flexor MMT and hip abduction,extension, IR/ER of 4/5 per protocol to demonstrate improved strength for ADL.  ?Baseline:  ?Goal status: Ongoing ?  ?  ?  ?  ?PLAN: ?PT FREQUENCY: 2x/week ?  ?PT DURATION: 8 weeks ?  ?PLANNED INTERVENTIONS: Therapeutic exercises, Therapeutic activity, Neuromuscular  re-education, Balance training, Gait training, Patient/Family education, Joint manipulation, Joint mobilization, Stair training, Orthotic/Fit training, DME instructions, Aquatic Therapy, Dry Needling, Electrical stimulation, Spinal manipulation, Spinal mobilization, Cryotherapy, Moist heat, Compression bandaging, scar mobilization, Splintting, Taping, Traction, Ultrasound, Ionotophoresis '4mg'$ /ml Dexamethasone, and Manual therapy ?  ?  ?PLAN FOR NEXT SESSION: complete FOTO, follow patient protocol and progress when able; patient 4 weeks post op, DOS 05/23/21 ? ? ?11:56 AM, 06/20/21 ?Mearl Latin PT, DPT ?Physical Therapist at [facility] ?[facility] ? ? ?   ?

## 2021-06-20 NOTE — Patient Instructions (Signed)
Access Code: [email or web address] ?URL: [email or web address] ?Date: 06/20/2021 ?Prepared by: Mitzi Hansen Salle Brandle ? ?Exercises ?- Bent Knee Fallouts  - 1 x daily - 7 x weekly - 3 sets - 10 reps ?- Supine Heel Slide (Mirrored)  - 1 x daily - 7 x weekly - 3 sets - 10 reps ?- Prone Heel Squeeze  - 1 x daily - 7 x weekly - 3 sets - 10 reps - 5-10 second  hold ?- Quadruped Rocking Backward  - 1 x daily - 7 x weekly - 3 sets - 10 reps ?

## 2021-06-22 ENCOUNTER — Encounter (HOSPITAL_COMMUNITY): Payer: 59

## 2021-06-25 NOTE — Therapy (Signed)
?OUTPATIENT PHYSICAL THERAPY TREATMENT NOTE ? ? ?Patient Name: Virginia Hawkins ?MRN: [id number] ?DOB:03-21-1971, 51 y.o., female ?Today's Date: 06/26/2021 ? ?PCP: Noralee Stain, MD ?REFERRING PROVIDER: Rosalio Macadamia, MD ? ? PT End of Session - 06/26/21 0948   ? ? Visit Number 3   ? Number of Visits 16   ? Date for PT Re-Evaluation 08/09/21   ? Authorization Type [place] [employer] No auth, no VL, med necessity after 25th visit)   ? PT Start Time [phone]   ? PT Stop Time 1035   ? PT Time Calculation (min) 48 min   ? Activity Tolerance Patient tolerated treatment well   ? Behavior During Therapy [facility] for tasks assessed/performed   ? ?  ?  ? ?  ? ? ? ?Past Medical History:  ?Diagnosis Date  ? Fibromyalgia   ? Hypertension   ? Ovarian cyst   ? Vaginal Pap smear, abnormal   ? ?Past Surgical History:  ?Procedure Laterality Date  ? CYST EXCISION    ? FOOT SURGERY Left 2020  ? for plantar fasciitis  ? SALPINGECTOMY    ? ?Patient Active Problem List  ? Diagnosis Date Noted  ? Amenorrhea 05/01/2020  ? Plantar fasciitis 09/01/2017  ? History of ovarian cyst 03/23/2015  ? History of abnormal cervical Pap smear 03/23/2015  ? Fibromyalgia 03/23/2015  ? Hypertension, essential 10/05/2014  ? Vitiligo 10/05/2014  ? ? ?REFERRING DIAG: Z98.890 s/p arthroscopy of hip ? ?THERAPY DIAG:  ?Pain in right hip ? ?Acute pain of right knee ? ?Muscle weakness (generalized) ? ?Pain in left ankle and joints of left foot ? ?Other abnormalities of gait and mobility ? ?Other symptoms and signs involving the musculoskeletal system ? ?PERTINENT HISTORY: DOS 05/23/21; Fall Sept. 2022 ? ?PRECAUTIONS: PRECAUTIONS: Other: Arthroscopic hip labral repair with femoral osteoplasty - see protocol for guideline and precautions in media ?  ?WEIGHT BEARING RESTRICTIONS Yes Week 1-4 50% flat foot weight bearing, WBAT >4 weeks ? ?SUBJECTIVE: Exercise going well. Has been working on walking.  ? ?PAIN:  ?Are you having pain? Yes: NPRS scale: 4/10 ?Pain  location: R anterior hip ?Pain description: tender ?Aggravating factors: movement ?Relieving factors: rest ? ? ?  ?PATIENT GOALS to walk better and full mobility ?  ?  ?OBJECTIVE:  ?  ?  ?  ?PATIENT SURVEYS:  ?FOTO done 06/26/21 score 51 ?  ? ?  ?LE ROM: ?  ?Passive ROM Right ?06/14/2021 Left ?06/14/2021  ?Hip flexion 90    ?Hip extension Lacking 7    ?Hip abduction      ?Hip adduction      ?Hip internal rotation      ?Hip external rotation      ?Knee flexion      ?Knee extension      ?Ankle dorsiflexion      ?Ankle plantarflexion      ?Ankle inversion      ?Ankle eversion      ? (Blank rows = not tested) ?  ?LE MMT: ?  ?MMT Right ?06/14/2021 Left ?06/14/2021  ?Hip flexion   5/5  ?Hip extension      ?Hip abduction      ?Hip adduction      ?Hip internal rotation      ?Hip external rotation      ?Knee flexion 5/5 5/5  ?Knee extension 4+/5 5/5  ?Ankle dorsiflexion 5/5 5/5  ?Ankle plantarflexion      ?Ankle inversion      ?  Ankle eversion      ? (Blank rows = not tested) ?  ?  ?  ?FUNCTIONAL TESTS:  ?Transfers: labored, unsteady ?  ?GAIT: ?Distance walked: 100 ?Assistive device utilized: cane ?Level of assistance: Modified independence ?Comments: trunk flexed slightly ?  ?  ?  ?TODAY'S TREATMENT: ?06/26/21 ?Supine bend knee fall outs 2x 10 bilateral  ?Hip ADD with ball x 20 ?Hip ABD with belt x 20 ?Heel slides 3x 10 in supine AROM ?Bridge 2 x 10 ?Sidelying clam 2 x 10 ?STS 24 inch STS 1x 10  ?FOTO assessment ? ? ? ?06/20/21 ?Supine bend knee fall outs 2x 10 bilateral  ?Heel slides 3x 10 in supine AROM ?Prone heel squeeze 2x 10 5 second holds ?Quadruped rocking 2x 10  ?STS 24 inch STS 1x 10  ?Gait with unilateral crutch/cane 200 feet ? ?06/14/21 ?Quad set 10 x 10 second holds ?Glute sets 10 x 10 second holds ?TRA activation 10 x 5 second holds 10 x 5 second holds ?Hip abduction and adduction isometrics with belt and ball 10x 10 second holds each ?  ?  ?PATIENT EDUCATION:  ?Education details: Patient educated on HEP, gait  mechanics ?Person educated: Patient ?Education method: Explanation, Demonstration, and Handouts ?Education comprehension: verbalized understanding and returned demonstration ?  ?  ?HOME EXERCISE PROGRAM: ?Quad set, glute set, TRA activation, hip abduction/adduction iso 3/29 heel slides, bent knee fall outs, quadruped rocking, prone heel squeeze ? ? ? Access Code: [email or web address] ?URL: [email or web address] ?Date: 06/26/2021 ?Prepared by: [facility] ? ?Exercises ?- Supine Bridge  - 1 x daily - 7 x weekly - 2 sets - 10 reps ?- Clamshell  - 1 x daily - 7 x weekly - 2 sets - 10 reps ?ASSESSMENT: ?  ?CLINICAL IMPRESSION: ?Patient ambulating with cane on arrival to therapy today. She has declining pain levels although not pain free. Patient educated on protocol and weight bearing status; before progression to Phase 2 she should have full non painful weightbearing.  Progressed per protocol and patient performing well. Added bridges and side lying clamshells today. Tolerates strengthening and mobility without c/o increased symptoms. Gait performed with cane as patient WBAT with cueing for normalized mechanics.FOTO assessment done today.  Patient will continue to benefit from physical therapy in order to improve function and reduce impairment. ? ?  ?  ?  ?  ?OBJECTIVE IMPAIRMENTS Abnormal gait, decreased activity tolerance, decreased balance, decreased knowledge of use of DME, difficulty walking, decreased ROM, decreased strength, impaired perceived functional ability, increased muscle spasms, impaired flexibility, improper body mechanics, and pain.  ?  ?ACTIVITY LIMITATIONS cleaning, community activity, driving, meal prep, occupation, laundry, yard work, shopping, and school.  ?  ?PERSONAL FACTORS Behavior pattern, Past/current experiences, and Time since onset of injury/illness/exacerbation are also affecting patient's functional outcome.  ?  ?  ?REHAB POTENTIAL: Good ?  ?CLINICAL DECISION MAKING:  Stable/uncomplicated ?  ?EVALUATION COMPLEXITY: Low ?  ?GOALS: ?Goals reviewed with patient? Yes ?  ?SHORT TERM GOALS: Target date: 07/12/2021 ?  ?Patient will be independent with HEP in order to improve functional outcomes. ?Baseline:  ?Goal status: Ongoing ?  ?2.  Patient will report at least 25% improvement in symptoms for improved quality of life. ?Baseline:  ?Goal status: Ongoing ?  ?3.  Patient will ambulate with full weight bearing without pain with normalized mechanics for progression to next phase of protocol.  ?Baseline:  ?Goal status: Ongoing ?  ?  ?LONG TERM GOALS: Target date: 08/09/2021 ?  ?Patient  will report at least 75% improvement in symptoms for improved quality of life. ?Baseline:  ?Goal status: Ongoing ?  ?2.  Patient will improve FOTO score by at least 20 points in order to indicate improved tolerance to activity. ?Baseline: complete next session ?Goal status: Ongoing ?  ?3.  Patient will be able to navigate stairs with reciprocal pattern without compensation in order to demonstrate improved LE strength. ?Baseline:  ?Goal status: Ongoing ?  ?4.  Patient will demonstrate at least 4-/5 hip flexor MMT and hip abduction,extension, IR/ER of 4/5 per protocol to demonstrate improved strength for ADL.  ?Baseline:  ?Goal status: Ongoing ?  ?  ?  ?  ?PLAN: ?PT FREQUENCY: 2x/week ?  ?PT DURATION: 8 weeks ?  ?PLANNED INTERVENTIONS: Therapeutic exercises, Therapeutic activity, Neuromuscular re-education, Balance training, Gait training, Patient/Family education, Joint manipulation, Joint mobilization, Stair training, Orthotic/Fit training, DME instructions, Aquatic Therapy, Dry Needling, Electrical stimulation, Spinal manipulation, Spinal mobilization, Cryotherapy, Moist heat, Compression bandaging, scar mobilization, Splintting, Taping, Traction, Ultrasound, Ionotophoresis '4mg'$ /ml Dexamethasone, and Manual therapy ?  ?  ?PLAN FOR NEXT SESSION:  follow patient protocol and progress when able; patient 5 weeks  post op as of tomorrow 06/27/21, DOS 05/23/21 ? ? ?10:40 AM, 06/26/21 ?Shantana Christon Small Abigal Choung MPT ?[facility] ?[place] [phone] ?Ph:[phone] ? ? ?   ?

## 2021-06-26 ENCOUNTER — Ambulatory Visit (HOSPITAL_COMMUNITY): Payer: 59 | Attending: Orthopedic Surgery

## 2021-06-26 DIAGNOSIS — M25551 Pain in right hip: Secondary | ICD-10-CM | POA: Diagnosis not present

## 2021-06-26 DIAGNOSIS — M25572 Pain in left ankle and joints of left foot: Secondary | ICD-10-CM | POA: Diagnosis not present

## 2021-06-26 DIAGNOSIS — R29898 Other symptoms and signs involving the musculoskeletal system: Secondary | ICD-10-CM | POA: Diagnosis present

## 2021-06-26 DIAGNOSIS — M25561 Pain in right knee: Secondary | ICD-10-CM | POA: Diagnosis present

## 2021-06-26 DIAGNOSIS — R2689 Other abnormalities of gait and mobility: Secondary | ICD-10-CM | POA: Insufficient documentation

## 2021-06-26 DIAGNOSIS — M6281 Muscle weakness (generalized): Secondary | ICD-10-CM | POA: Diagnosis not present

## 2021-06-28 ENCOUNTER — Ambulatory Visit (HOSPITAL_COMMUNITY): Payer: 59

## 2021-06-28 DIAGNOSIS — M25551 Pain in right hip: Secondary | ICD-10-CM

## 2021-06-28 DIAGNOSIS — R29898 Other symptoms and signs involving the musculoskeletal system: Secondary | ICD-10-CM | POA: Diagnosis not present

## 2021-06-28 DIAGNOSIS — R2689 Other abnormalities of gait and mobility: Secondary | ICD-10-CM | POA: Diagnosis not present

## 2021-06-28 DIAGNOSIS — M25561 Pain in right knee: Secondary | ICD-10-CM

## 2021-06-28 DIAGNOSIS — M6281 Muscle weakness (generalized): Secondary | ICD-10-CM

## 2021-06-28 DIAGNOSIS — M25572 Pain in left ankle and joints of left foot: Secondary | ICD-10-CM | POA: Diagnosis not present

## 2021-06-28 NOTE — Therapy (Signed)
OUTPATIENT PHYSICAL THERAPY TREATMENT NOTE   Patient Name: Virginia Hawkins MRN: [id number] DOB:1971/01/26, 51 y.o., female Today's Date: 06/28/2021  PCP: Danae Chen, MD REFERRING PROVIDER: Lily Kocher, MD   PT End of Session - 06/28/21 0954     Visit Number 4    Number of Visits 16    Date for PT Re-Evaluation 08/09/21    Authorization Type [place] [employer] No auth, no VL, med necessity after 25th visit)    PT Start Time 0953    PT Stop Time 1032    PT Time Calculation (min) 39 min    Activity Tolerance Patient tolerated treatment well    Behavior During Therapy [facility] for tasks assessed/performed               Past Medical History:  Diagnosis Date   Fibromyalgia    Hypertension    Ovarian cyst    Vaginal Pap smear, abnormal    Past Surgical History:  Procedure Laterality Date   CYST EXCISION     FOOT SURGERY Left 2020   for plantar fasciitis   SALPINGECTOMY     Patient Active Problem List   Diagnosis Date Noted   Amenorrhea 05/01/2020   Plantar fasciitis 09/01/2017   History of ovarian cyst 03/23/2015   History of abnormal cervical Pap smear 03/23/2015   Fibromyalgia 03/23/2015   Hypertension, essential 10/05/2014   Vitiligo 10/05/2014    REFERRING DIAG: N82.956 s/p arthroscopy of hip  THERAPY DIAG:  Pain in right hip  Acute pain of right knee  Muscle weakness (generalized)  Other abnormalities of gait and mobility  PERTINENT HISTORY: DOS 05/23/21; Fall Sept. 2022  PRECAUTIONS: PRECAUTIONS: Other: Arthroscopic hip labral repair with femoral osteoplasty - see protocol for guideline and precautions in media   WEIGHT BEARING RESTRICTIONS Yes Week 1-4 50% flat foot weight bearing, WBAT >4 weeks  SUBJECTIVE: No falls, no questions about exercises. Reports she is better everyday  PAIN:  Are you having pain? Yes: NPRS scale: 3/10 Pain location: R anterior hip Pain description: tender Aggravating factors: movement Relieving  factors: rest     PATIENT GOALS to walk better and full mobility     OBJECTIVE:        PATIENT SURVEYS:  FOTO done 06/26/21 score 51      LE ROM:   Passive ROM Right 06/14/2021 Left 06/14/2021  Hip flexion 90    Hip extension Lacking 7    Hip abduction      Hip adduction      Hip internal rotation      Hip external rotation      Knee flexion      Knee extension      Ankle dorsiflexion      Ankle plantarflexion      Ankle inversion      Ankle eversion       (Blank rows = not tested)   LE MMT:   MMT Right 06/14/2021 Left 06/14/2021  Hip flexion   5/5  Hip extension      Hip abduction      Hip adduction      Hip internal rotation      Hip external rotation      Knee flexion 5/5 5/5  Knee extension 4+/5 5/5  Ankle dorsiflexion 5/5 5/5  Ankle plantarflexion      Ankle inversion      Ankle eversion       (Blank rows = not tested)  FUNCTIONAL TESTS:  Transfers: labored, unsteady   GAIT: Distance walked: 100 Assistive device utilized: cane Level of assistance: Modified independence Comments: trunk flexed slightly       TODAY'S TREATMENT: 06/28/21 Supine bend knee fall outs 2x 10 bilateral  Hip ADD with ball x 20 Hip ABD with belt x 20 Heel slides 3 x 10 in supine AROM Bridge 2 x 10 Sidelying clam 2 x 10  Bridge with marching 2 x 10 Foam squats x 10 STS to foam x 10 * bothered her knees* Step ups 4" box 2 x 10 Trial of leg press with Tband is painful so d/c Discussed scar massage with patient Manual resistance leg press x 5    06/26/21 Supine bend knee fall outs 2x 10 bilateral  Hip ADD with ball x 20 Hip ABD with belt x 20 Heel slides 3x 10 in supine AROM Bridge 2 x 10 Sidelying clam 2 x 10 STS 24 inch STS 1x 10  FOTO assessment    06/20/21 Supine bend knee fall outs 2x 10 bilateral  Heel slides 3x 10 in supine AROM Prone heel squeeze 2x 10 5 second holds Quadruped rocking 2x 10  STS 24 inch STS 1x 10  Gait with unilateral  crutch/cane 200 feet  06/14/21 Quad set 10 x 10 second holds Glute sets 10 x 10 second holds TRA activation 10 x 5 second holds 10 x 5 second holds Hip abduction and adduction isometrics with belt and ball 10x 10 second holds each     PATIENT EDUCATION:  Education details: Patient educated on HEP, Animator Person educated: Patient Education method: Programmer, multimedia, Facilities manager, and Handouts Education comprehension: verbalized understanding and returned demonstration     HOME EXERCISE PROGRAM:  06/28/21 Bridge with marching, step ups   Quad set, glute set, TRA activation, hip abduction/adduction iso 3/29 heel slides, bent knee fall outs, quadruped rocking, prone heel squeeze    Exercises - Supine Bridge  - 1 x daily - 7 x weekly - 2 sets - 10 reps - Clamshell  - 1 x daily - 7 x weekly - 2 sets - 10 reps ASSESSMENT:   CLINICAL IMPRESSION: Patient reports she took her brace off for about 30 min yesterday.  She has declining pain levels although not pain free.  Progressed per protocol and patient performing well. Added bridges with marching and shallow squats on foam; tried leg press with Theraband but this is painful so we d/c. Patient states her incisions are quite tender so therapist instructed her in scar massage to help desensitize.   Tolerates strengthening and mobility without c/o increased symptoms.   Patient will continue to benefit from physical therapy in order to improve function and reduce impairment.          OBJECTIVE IMPAIRMENTS Abnormal gait, decreased activity tolerance, decreased balance, decreased knowledge of use of DME, difficulty walking, decreased ROM, decreased strength, impaired perceived functional ability, increased muscle spasms, impaired flexibility, improper body mechanics, and pain.    ACTIVITY LIMITATIONS cleaning, community activity, driving, meal prep, occupation, laundry, yard work, shopping, and school.    PERSONAL FACTORS Behavior pattern,  Past/current experiences, and Time since onset of injury/illness/exacerbation are also affecting patient's functional outcome.      REHAB POTENTIAL: Good   CLINICAL DECISION MAKING: Stable/uncomplicated   EVALUATION COMPLEXITY: Low   GOALS: Goals reviewed with patient? Yes   SHORT TERM GOALS: Target date: 07/12/2021   Patient will be independent with HEP in order to improve functional outcomes.  Baseline:  Goal status: Ongoing   2.  Patient will report at least 25% improvement in symptoms for improved quality of life. Baseline:  Goal status: Ongoing   3.  Patient will ambulate with full weight bearing without pain with normalized mechanics for progression to next phase of protocol.  Baseline:  Goal status: Ongoing     LONG TERM GOALS: Target date: 08/09/2021   Patient will report at least 75% improvement in symptoms for improved quality of life. Baseline:  Goal status: Ongoing   2.  Patient will improve FOTO score by at least 20 points in order to indicate improved tolerance to activity. Baseline: complete next session Goal status: Ongoing   3.  Patient will be able to navigate stairs with reciprocal pattern without compensation in order to demonstrate improved LE strength. Baseline:  Goal status: Ongoing   4.  Patient will demonstrate at least 4-/5 hip flexor MMT and hip abduction,extension, IR/ER of 4/5 per protocol to demonstrate improved strength for ADL.  Baseline:  Goal status: Ongoing         PLAN: PT FREQUENCY: 2x/week   PT DURATION: 8 weeks   PLANNED INTERVENTIONS: Therapeutic exercises, Therapeutic activity, Neuromuscular re-education, Balance training, Gait training, Patient/Family education, Joint manipulation, Joint mobilization, Stair training, Orthotic/Fit training, DME instructions, Aquatic Therapy, Dry Needling, Electrical stimulation, Spinal manipulation, Spinal mobilization, Cryotherapy, Moist heat, Compression bandaging, scar mobilization,  Splintting, Taping, Traction, Ultrasound, Ionotophoresis 4mg /ml Dexamethasone, and Manual therapy     PLAN FOR NEXT SESSION:  follow patient protocol and progress when able; patient 5 weeks post op as of tomorrow 06/27/21, DOS 05/23/21   10:35 AM, 06/28/21 Clarisse Rodriges Small Kayli Beal MPT [facility] [place] [phone] Ph:[phone]

## 2021-07-02 NOTE — Therapy (Signed)
OUTPATIENT PHYSICAL THERAPY TREATMENT NOTE   Patient Name: Virginia Hawkins MRN: [id number] DOB:Mar 28, 1970, 51 y.o., female Today's Date: 07/03/2021  PCP: Danae Chen, MD REFERRING PROVIDER: Lily Kocher, MD   PT End of Session - 07/03/21 0957     Visit Number 5    Number of Visits 16    Date for PT Re-Evaluation 08/09/21    Authorization Type  [facility] No auth, no VL, med necessity after 25th visit)    PT Start Time 0956    PT Stop Time 1035    PT Time Calculation (min) 39 min    Activity Tolerance Patient tolerated treatment well    Behavior During Therapy [facility] for tasks assessed/performed                Past Medical History:  Diagnosis Date   Fibromyalgia    Hypertension    Ovarian cyst    Vaginal Pap smear, abnormal    Past Surgical History:  Procedure Laterality Date   CYST EXCISION     FOOT SURGERY Left 2020   for plantar fasciitis   SALPINGECTOMY     Patient Active Problem List   Diagnosis Date Noted   Amenorrhea 05/01/2020   Plantar fasciitis 09/01/2017   History of ovarian cyst 03/23/2015   History of abnormal cervical Pap smear 03/23/2015   Fibromyalgia 03/23/2015   Hypertension, essential 10/05/2014   Vitiligo 10/05/2014    REFERRING DIAG: W09.811 s/p arthroscopy of hip  THERAPY DIAG:  Pain in right hip  Acute pain of right knee  Muscle weakness (generalized)  Other abnormalities of gait and mobility  PERTINENT HISTORY: DOS 05/23/21; Fall Sept. 2022  PRECAUTIONS: PRECAUTIONS: Other: Arthroscopic hip labral repair with femoral osteoplasty - see protocol for guideline and precautions in media   WEIGHT BEARING RESTRICTIONS Yes Week 1-4 50% flat foot weight bearing, WBAT >4 weeks  SUBJECTIVE: She states complaint of groin tenderness today. She reports no falls, no specific reasons she knows of for increased hip pain. She reports some knee pain bilaterally especially with descending steps. Sees MD  07/16/21  PAIN:  Are you having pain? Yes: NPRS scale: 7/10 Pain location: R anterior hip Pain description: tender Aggravating factors: movement Relieving factors: rest     PATIENT GOALS to walk better and full mobility     OBJECTIVE:        PATIENT SURVEYS:  FOTO done 06/26/21 score 51      LE ROM:   Passive ROM Right 06/14/2021 Left 06/14/2021  Hip flexion 90    Hip extension Lacking 7    Hip abduction      Hip adduction      Hip internal rotation      Hip external rotation      Knee flexion      Knee extension      Ankle dorsiflexion      Ankle plantarflexion      Ankle inversion      Ankle eversion       (Blank rows = not tested)   LE MMT:   MMT Right 06/14/2021 Left 06/14/2021  Hip flexion   5/5  Hip extension      Hip abduction      Hip adduction      Hip internal rotation      Hip external rotation      Knee flexion 5/5 5/5  Knee extension 4+/5 5/5  Ankle dorsiflexion 5/5 5/5  Ankle plantarflexion  Ankle inversion      Ankle eversion       (Blank rows = not tested)       FUNCTIONAL TESTS:  Transfers: labored, unsteady   GAIT: Distance walked: 100 Assistive device utilized: cane Level of assistance: Modified independence Comments: trunk flexed slightly       TODAY'S TREATMENT: 07/03/21  Supine Supine bend knee fall outs 2 min bilat Hip ADD with ball x 2 min Hip ABD with belt x 2 min Heel slides 3 x 10 in supine AROM Bridge 2 min              Bridge with marching 2 x 1 min  Sidelying clam 2'  STS from slightly elevated table 2 x 10 ; needs Verbal cues for form  Standing Step ups 6" box 2 x 10 Heel raises 2 x 10 Slant board 3 x 20"   06/28/21 Supine bend knee fall outs 2x 10 bilateral  Hip ADD with ball x 20 Hip ABD with belt x 20 Heel slides 3 x 10 in supine AROM Bridge 2 x 10 Sidelying clam 2 x 10  Bridge with marching 2 x 10 Foam squats x 10 STS to foam x 10 * bothered her knees* Step ups 4" box 2 x 10 Trial of  leg press with Tband is painful so d/c Discussed scar massage with patient Manual resistance leg press x 5    06/26/21 Supine bend knee fall outs 2x 10 bilateral  Hip ADD with ball x 20 Hip ABD with belt x 20 Heel slides 3x 10 in supine AROM Bridge 2 x 10 Sidelying clam 2 x 10 STS 24 inch STS 1x 10  FOTO assessment    06/20/21 Supine bend knee fall outs 2x 10 bilateral  Heel slides 3x 10 in supine AROM Prone heel squeeze 2x 10 5 second holds Quadruped rocking 2x 10  STS 24 inch STS 1x 10  Gait with unilateral crutch/cane 200 feet  06/14/21 Quad set 10 x 10 second holds Glute sets 10 x 10 second holds TRA activation 10 x 5 second holds 10 x 5 second holds Hip abduction and adduction isometrics with belt and ball 10x 10 second holds each     PATIENT EDUCATION:  Education details: Patient educated on HEP, Animator Person educated: Patient Education method: Programmer, multimedia, Facilities manager, and Handouts Education comprehension: verbalized understanding and returned demonstration     HOME EXERCISE PROGRAM:  06/28/21 Bridge with marching, step ups   Quad set, glute set, TRA activation, hip abduction/adduction iso 3/29 heel slides, bent knee fall outs, quadruped rocking, prone heel squeeze    Exercises - Supine Bridge  - 1 x daily - 7 x weekly - 2 sets - 10 reps - Clamshell  - 1 x daily - 7 x weekly - 2 sets - 10 reps ASSESSMENT:   CLINICAL IMPRESSION: Today patient has some increased pain; she reports also having some knee pain limiting her activity. Increased step height today, added   but did not progress significantly due to increased pain levels today.   Patient will continue to benefit from physical therapy in order to improve function and reduce impairment.          OBJECTIVE IMPAIRMENTS Abnormal gait, decreased activity tolerance, decreased balance, decreased knowledge of use of DME, difficulty walking, decreased ROM, decreased strength, impaired perceived  functional ability, increased muscle spasms, impaired flexibility, improper body mechanics, and pain.    ACTIVITY LIMITATIONS cleaning, community activity, driving, meal  prep, occupation, laundry, yard work, shopping, and school.    PERSONAL FACTORS Behavior pattern, Past/current experiences, and Time since onset of injury/illness/exacerbation are also affecting patient's functional outcome.      REHAB POTENTIAL: Good   CLINICAL DECISION MAKING: Stable/uncomplicated   EVALUATION COMPLEXITY: Low   GOALS: Goals reviewed with patient? Yes   SHORT TERM GOALS: Target date: 07/12/2021   Patient will be independent with HEP in order to improve functional outcomes. Baseline:  Goal status: Ongoing   2.  Patient will report at least 25% improvement in symptoms for improved quality of life. Baseline:  Goal status: Ongoing   3.  Patient will ambulate with full weight bearing without pain with normalized mechanics for progression to next phase of protocol.  Baseline:  Goal status: Ongoing     LONG TERM GOALS: Target date: 08/09/2021   Patient will report at least 75% improvement in symptoms for improved quality of life. Baseline:  Goal status: Ongoing   2.  Patient will improve FOTO score by at least 20 points in order to indicate improved tolerance to activity. Baseline: complete next session Goal status: Ongoing   3.  Patient will be able to navigate stairs with reciprocal pattern without compensation in order to demonstrate improved LE strength. Baseline:  Goal status: Ongoing   4.  Patient will demonstrate at least 4-/5 hip flexor MMT and hip abduction,extension, IR/ER of 4/5 per protocol to demonstrate improved strength for ADL.  Baseline:  Goal status: Ongoing         PLAN: PT FREQUENCY: 2x/week   PT DURATION: 8 weeks   PLANNED INTERVENTIONS: Therapeutic exercises, Therapeutic activity, Neuromuscular re-education, Balance training, Gait training, Patient/Family  education, Joint manipulation, Joint mobilization, Stair training, Orthotic/Fit training, DME instructions, Aquatic Therapy, Dry Needling, Electrical stimulation, Spinal manipulation, Spinal mobilization, Cryotherapy, Moist heat, Compression bandaging, scar mobilization, Splintting, Taping, Traction, Ultrasound, Ionotophoresis 4mg /ml Dexamethasone, and Manual therapy     PLAN FOR NEXT SESSION:  follow patient protocol and progress when able; patient 6 weeks post op as of tomorrow 07/04/21, DOS 05/23/21. Try lateral step ups   10:34 AM, 07/03/21 Jaycion Treml Small Stepfon Rawles MPT [place] [facility] [place] [phone]

## 2021-07-03 ENCOUNTER — Ambulatory Visit (HOSPITAL_COMMUNITY): Payer: 59

## 2021-07-03 DIAGNOSIS — M6281 Muscle weakness (generalized): Secondary | ICD-10-CM

## 2021-07-03 DIAGNOSIS — M25551 Pain in right hip: Secondary | ICD-10-CM

## 2021-07-03 DIAGNOSIS — M25561 Pain in right knee: Secondary | ICD-10-CM | POA: Diagnosis not present

## 2021-07-03 DIAGNOSIS — R2689 Other abnormalities of gait and mobility: Secondary | ICD-10-CM | POA: Diagnosis not present

## 2021-07-03 DIAGNOSIS — R29898 Other symptoms and signs involving the musculoskeletal system: Secondary | ICD-10-CM | POA: Diagnosis not present

## 2021-07-03 DIAGNOSIS — M25572 Pain in left ankle and joints of left foot: Secondary | ICD-10-CM | POA: Diagnosis not present

## 2021-07-05 ENCOUNTER — Encounter (HOSPITAL_COMMUNITY): Payer: Self-pay

## 2021-07-05 ENCOUNTER — Ambulatory Visit (HOSPITAL_COMMUNITY): Payer: 59

## 2021-07-05 DIAGNOSIS — M6281 Muscle weakness (generalized): Secondary | ICD-10-CM | POA: Diagnosis not present

## 2021-07-05 DIAGNOSIS — M25551 Pain in right hip: Secondary | ICD-10-CM

## 2021-07-05 DIAGNOSIS — M25561 Pain in right knee: Secondary | ICD-10-CM

## 2021-07-05 DIAGNOSIS — R2689 Other abnormalities of gait and mobility: Secondary | ICD-10-CM | POA: Diagnosis not present

## 2021-07-05 DIAGNOSIS — M25572 Pain in left ankle and joints of left foot: Secondary | ICD-10-CM | POA: Diagnosis not present

## 2021-07-05 DIAGNOSIS — R29898 Other symptoms and signs involving the musculoskeletal system: Secondary | ICD-10-CM | POA: Diagnosis not present

## 2021-07-05 NOTE — Therapy (Signed)
?OUTPATIENT PHYSICAL THERAPY TREATMENT NOTE ? ? ?Patient Name: Virginia Hawkins ?MRN: [id number] ?DOB:1970-06-22, 51 y.o., female ?Today's Date: 07/05/2021 ? ?PCP: Noralee Stain, MD ?REFERRING PROVIDER: Rosalio Macadamia, MD ? ?Next Apt scheduled with MD Bobby Rumpf: 07/13/21 ? PT End of Session - 07/05/21 1017   ? ? Visit Number 6   ? Number of Visits 16   ? Date for PT Re-Evaluation 08/09/21   ? Authorization Type [place] [employer] No auth, no VL, med necessity after 25th visit)   ? Authorization - Visit Number 6   ? Authorization - Number of Visits 25   ? PT Start Time 1009   late arrival  ? PT Stop Time 1045   ? PT Time Calculation (min) 36 min   ? Activity Tolerance Patient tolerated treatment well   ? Behavior During Therapy [facility] for tasks assessed/performed   ? ?  ?  ? ?  ? ? ? ?Past Medical History:  ?Diagnosis Date  ? Fibromyalgia   ? Hypertension   ? Ovarian cyst   ? Vaginal Pap smear, abnormal   ? ?Past Surgical History:  ?Procedure Laterality Date  ? CYST EXCISION    ? FOOT SURGERY Left 2020  ? for plantar fasciitis  ? SALPINGECTOMY    ? ?Patient Active Problem List  ? Diagnosis Date Noted  ? Amenorrhea 05/01/2020  ? Plantar fasciitis 09/01/2017  ? History of ovarian cyst 03/23/2015  ? History of abnormal cervical Pap smear 03/23/2015  ? Fibromyalgia 03/23/2015  ? Hypertension, essential 10/05/2014  ? Vitiligo 10/05/2014  ? ? ?REFERRING DIAG: Z98.890 s/p arthroscopy of hip ? ?THERAPY DIAG:  ?Pain in right hip ? ?Acute pain of right knee ? ?Muscle weakness (generalized) ? ?Other abnormalities of gait and mobility ? ?PERTINENT HISTORY: DOS 05/23/21; Fall Sept. 2022 ? ?PRECAUTIONS: PRECAUTIONS: Other: Arthroscopic hip labral repair with femoral osteoplasty - see protocol for guideline and precautions in media ?  ?WEIGHT BEARING RESTRICTIONS Yes Week 1-4 50% flat foot weight bearing, WBAT >4 weeks ? ?SUBJECTIVE: Pt stated hip is sore and tender, most difficulty with bending forward.  Pain scale 5/10  today. ? ?PAIN:  ?Are you having pain? Yes: NPRS scale: 5/10 ?Pain location: R anterior hip ?Pain description: tender ?Aggravating factors: movement ?Relieving factors: rest ? ? ?  ?PATIENT GOALS to walk better and full mobility ?  ?  ?OBJECTIVE:  ?  ?  ?  ?PATIENT SURVEYS:  ?FOTO done 06/26/21 score 51 ?  ? ?  ?LE ROM: ?  ?Passive ROM Right ?06/14/2021 Left ?06/14/2021 Right  ?07/05/21  ?Hip flexion 90   93  ?Hip extension Lacking 7   0  ?Hip abduction       ?Hip adduction       ?Hip internal rotation       ?Hip external rotation       ?Knee flexion     93  ?Knee extension     0  ?Ankle dorsiflexion       ?Ankle plantarflexion       ?Ankle inversion       ?Ankle eversion       ? (Blank rows = not tested) ?  ?LE MMT: ?  ?MMT Right ?06/14/2021 Left ?06/14/2021  ?Hip flexion   5/5  ?Hip extension      ?Hip abduction      ?Hip adduction      ?Hip internal rotation      ?Hip external rotation      ?  Knee flexion 5/5 5/5  ?Knee extension 4+/5 5/5  ?Ankle dorsiflexion 5/5 5/5  ?Ankle plantarflexion      ?Ankle inversion      ?Ankle eversion      ? (Blank rows = not tested) ?  ?  ?  ?FUNCTIONAL TESTS:  ?Transfers: labored, unsteady ?  ?GAIT: ?Distance walked: 100 ?Assistive device utilized: cane ?Level of assistance: Modified independence ?Comments: trunk flexed slightly ?  ?  ?  ?TODAY'S TREATMENT: ?07/05/21: ?  Upright bike no resistance 4' ? Standing:  ?  Heel raises 15x 5" ?  Squat front of chair 2x 10 cueing for mechanics ?  Step up 6in step 2x 15 with 1 HHA ?  Step down 4in 15 1 HHA ?  Tandem stance 2x 30". ?  Tandem stance on foam with intermittent HHA ?   ? ?07/03/21 ? Supine ?Supine bend knee fall outs 2 min bilat ?Hip ADD with ball x 2 min ?Hip ABD with belt x 2 min ?Heel slides 3 x 10 in supine AROM ?Bridge 2 min ?             Bridge with marching 2 x 1 min ? ?Sidelying clam 2' ? ?STS from slightly elevated table 2 x 10 ; needs Verbal cues for form ? ?Standing ?Step ups 6" box 2 x 10 ?Heel raises 2 x 10 ?Slant board 3 x  20" ? ? ?06/28/21 ?Supine bend knee fall outs 2x 10 bilateral  ?Hip ADD with ball x 20 ?Hip ABD with belt x 20 ?Heel slides 3 x 10 in supine AROM ?Bridge 2 x 10 ?Sidelying clam 2 x 10 ? ?Bridge with marching 2 x 10 ?Foam squats x 10 ?STS to foam x 10 * bothered her knees* ?Step ups 4" box 2 x 10 ?Trial of leg press with Tband is painful so d/c ?Discussed scar massage with patient ?Manual resistance leg press x 5 ? ? ? ?06/26/21 ?Supine bend knee fall outs 2x 10 bilateral  ?Hip ADD with ball x 20 ?Hip ABD with belt x 20 ?Heel slides 3x 10 in supine AROM ?Bridge 2 x 10 ?Sidelying clam 2 x 10 ?STS 24 inch STS 1x 10  ?FOTO assessment ? ? ? ?06/20/21 ?Supine bend knee fall outs 2x 10 bilateral  ?Heel slides 3x 10 in supine AROM ?Prone heel squeeze 2x 10 5 second holds ?Quadruped rocking 2x 10  ?STS 24 inch STS 1x 10  ?Gait with unilateral crutch/cane 200 feet ? ?06/14/21 ?Quad set 10 x 10 second holds ?Glute sets 10 x 10 second holds ?TRA activation 10 x 5 second holds 10 x 5 second holds ?Hip abduction and adduction isometrics with belt and ball 10x 10 second holds each ?  ?  ?PATIENT EDUCATION:  ?Education details: Patient educated on HEP, gait mechanics ?Person educated: Patient ?Education method: Explanation, Demonstration, and Handouts ?Education comprehension: verbalized understanding and returned demonstration ?  ?  ?HOME EXERCISE PROGRAM: ? ?06/28/21 ?Bridge with marching, step ups  ? ?Quad set, glute set, TRA activation, hip abduction/adduction iso 3/29 heel slides, bent knee fall outs, quadruped rocking, prone heel squeeze ? ? ? ?Exercises ?- Supine Bridge  - 1 x daily - 7 x weekly - 2 sets - 10 reps ?- Clamshell  - 1 x daily - 7 x weekly - 2 sets - 10 reps ?ASSESSMENT: ?  ?CLINICAL IMPRESSION: ?Pt at 6 week post-op and progressing well towards POC.  This session added upright bike, squats and progress  quad strength with additional step up/down.  Pt c/o Rt knee pain/pull descending steps, decreased height for comfort.   Added balance training with cueing to improve weight bearing, intermittent HHA required on dynamic surface. ? ?  ?  ?  ?  ?OBJECTIVE IMPAIRMENTS Abnormal gait, decreased activity tolerance, decreased balance, decreased knowledge of use of DME, difficulty walking, decreased ROM, decreased strength, impaired perceived functional ability, increased muscle spasms, impaired flexibility, improper body mechanics, and pain.  ?  ?ACTIVITY LIMITATIONS cleaning, community activity, driving, meal prep, occupation, laundry, yard work, shopping, and school.  ?  ?PERSONAL FACTORS Behavior pattern, Past/current experiences, and Time since onset of injury/illness/exacerbation are also affecting patient's functional outcome.  ?  ?  ?REHAB POTENTIAL: Good ?  ?CLINICAL DECISION MAKING: Stable/uncomplicated ?  ?EVALUATION COMPLEXITY: Low ?  ?GOALS: ?Goals reviewed with patient? Yes ?  ?SHORT TERM GOALS: Target date: 07/12/2021 ?  ?Patient will be independent with HEP in order to improve functional outcomes. ?Baseline:  ?Goal status: Ongoing ?  ?2.  Patient will report at least 25% improvement in symptoms for improved quality of life. ?Baseline:  ?Goal status: Ongoing ?  ?3.  Patient will ambulate with full weight bearing without pain with normalized mechanics for progression to next phase of protocol.  ?Baseline:  ?Goal status: Ongoing ?  ?  ?LONG TERM GOALS: Target date: 08/09/2021 ?  ?Patient will report at least 75% improvement in symptoms for improved quality of life. ?Baseline:  ?Goal status: Ongoing ?  ?2.  Patient will improve FOTO score by at least 20 points in order to indicate improved tolerance to activity. ?Baseline: complete next session ?Goal status: Ongoing ?  ?3.  Patient will be able to navigate stairs with reciprocal pattern without compensation in order to demonstrate improved LE strength. ?Baseline:  ?Goal status: Ongoing ?  ?4.  Patient will demonstrate at least 4-/5 hip flexor MMT and hip abduction,extension, IR/ER  of 4/5 per protocol to demonstrate improved strength for ADL.  ?Baseline:  ?Goal status: Ongoing ?  ?  ?  ?  ?PLAN: ?PT FREQUENCY: 2x/week ?  ?PT DURATION: 8 weeks ?  ?PLANNED INTERVENTIONS: Therapeutic exe

## 2021-07-09 ENCOUNTER — Ambulatory Visit (HOSPITAL_COMMUNITY): Payer: 59

## 2021-07-09 DIAGNOSIS — M25551 Pain in right hip: Secondary | ICD-10-CM | POA: Diagnosis not present

## 2021-07-09 DIAGNOSIS — M25561 Pain in right knee: Secondary | ICD-10-CM | POA: Diagnosis not present

## 2021-07-09 DIAGNOSIS — R29898 Other symptoms and signs involving the musculoskeletal system: Secondary | ICD-10-CM | POA: Diagnosis not present

## 2021-07-09 DIAGNOSIS — M25572 Pain in left ankle and joints of left foot: Secondary | ICD-10-CM | POA: Diagnosis not present

## 2021-07-09 DIAGNOSIS — R2689 Other abnormalities of gait and mobility: Secondary | ICD-10-CM

## 2021-07-09 DIAGNOSIS — M6281 Muscle weakness (generalized): Secondary | ICD-10-CM | POA: Diagnosis not present

## 2021-07-09 NOTE — Therapy (Signed)
OUTPATIENT PHYSICAL THERAPY TREATMENT NOTE   Patient Name: Virginia Hawkins MRN: [id number] DOB:03/14/1971, 51 y.o., female Today's Date: 07/09/2021  PCP: Danae Chen, MD REFERRING PROVIDER: Lily Kocher, MD  Next Apt scheduled with MD Melvyn Neth: 07/13/21  PT End of Session - 07/09/21 1302     Visit Number 7    Number of Visits 16    Date for PT Re-Evaluation 08/09/21    Authorization Type [place] [employer] No auth, no VL, med necessity after 25th visit)    Authorization - Visit Number 6    Authorization - Number of Visits 25    PT Start Time 1300    PT Stop Time 1345    PT Time Calculation (min) 45 min    Activity Tolerance Patient tolerated treatment well    Behavior During Therapy [facility] for tasks assessed/performed               Past Medical History:  Diagnosis Date   Fibromyalgia    Hypertension    Ovarian cyst    Vaginal Pap smear, abnormal    Past Surgical History:  Procedure Laterality Date   CYST EXCISION     FOOT SURGERY Left 2020   for plantar fasciitis   SALPINGECTOMY     Patient Active Problem List   Diagnosis Date Noted   Amenorrhea 05/01/2020   Plantar fasciitis 09/01/2017   History of ovarian cyst 03/23/2015   History of abnormal cervical Pap smear 03/23/2015   Fibromyalgia 03/23/2015   Hypertension, essential 10/05/2014   Vitiligo 10/05/2014    REFERRING DIAG: W09.811 s/p arthroscopy of hip  THERAPY DIAG:  Pain in right hip  Acute pain of right knee  Other abnormalities of gait and mobility  Pain in left ankle and joints of left foot  Muscle weakness (generalized)  Other symptoms and signs involving the musculoskeletal system  PERTINENT HISTORY: DOS 05/23/21; Fall Sept. 2022  PRECAUTIONS: PRECAUTIONS: Other: Arthroscopic hip labral repair with femoral osteoplasty - see protocol for guideline and precautions in media   WEIGHT BEARING RESTRICTIONS Yes Week 1-4 50% flat foot weight bearing, WBAT >4  weeks  SUBJECTIVE: Patient reports overall decreased pain today; still sore in the Right groin area.  see Dr. Eulah Pont about her knees on Wed and hip surgeon on Friday.  PAIN:  Are you having pain? Yes: NPRS scale: 4/10 Pain location: R anterior hip Pain description: tender Aggravating factors: movement Relieving factors: rest     PATIENT GOALS to walk better and full mobility     OBJECTIVE:        PATIENT SURVEYS:  FOTO done 06/26/21 score 51      LE ROM:   Passive ROM Right 06/14/2021 Left 06/14/2021 Right  07/05/21  Hip flexion 90   93  Hip extension Lacking 7   0  Hip abduction       Hip adduction       Hip internal rotation       Hip external rotation       Knee flexion     93  Knee extension     0  Ankle dorsiflexion       Ankle plantarflexion       Ankle inversion       Ankle eversion        (Blank rows = not tested)   LE MMT:   MMT Right 06/14/2021 Left 06/14/2021 Right 07/09/21 Left  07/09/21  Hip flexion   5/5 4/5 5/5  Hip  extension        Hip abduction        Hip adduction        Hip internal rotation        Hip external rotation        Knee flexion 5/5 5/5    Knee extension 4+/5 5/5 4+/5   Ankle dorsiflexion 5/5 5/5    Ankle plantarflexion        Ankle inversion        Ankle eversion         (Blank rows = not tested)       FUNCTIONAL TESTS:  Transfers: labored, unsteady   GAIT: Distance walked: 100 Assistive device utilized: cane Level of assistance: Modified independence Comments: trunk flexed slightly       TODAY'S TREATMENT: 07/09/21 Upright bike no resistance x 5"  Standing 15 x 5" Slant board 5 x 30"  Step up 12 in step 2' with 1 HHA  Lateral step ups 8 inch 2'   Tandem stance 2x 30". Tandem stance on foam with intermittent HHA  Gait train 1 lap in gym with focus on form; arm swing.   Functional Squats 2 x 10         07/05/21:   Upright bike no resistance 4'  Standing:    Heel raises 15x 5"   Squat front of  chair 2x 10 cueing for mechanics   Step up 6in step 2x 15 with 1 HHA   Step down 4in 15 1 HHA   Tandem stance 2x 30".   Tandem stance on foam with intermittent HHA     07/03/21  Supine Supine bend knee fall outs 2 min bilat Hip ADD with ball x 2 min Hip ABD with belt x 2 min Heel slides 3 x 10 in supine AROM Bridge 2 min              Bridge with marching 2 x 1 min  Sidelying clam 2'  STS from slightly elevated table 2 x 10 ; needs Verbal cues for form  Standing Step ups 6" box 2 x 10 Heel raises 2 x 10 Slant board 3 x 20"   06/28/21 Supine bend knee fall outs 2x 10 bilateral  Hip ADD with ball x 20 Hip ABD with belt x 20 Heel slides 3 x 10 in supine AROM Bridge 2 x 10 Sidelying clam 2 x 10  Bridge with marching 2 x 10 Foam squats x 10 STS to foam x 10 * bothered her knees* Step ups 4" box 2 x 10 Trial of leg press with Tband is painful so d/c Discussed scar massage with patient Manual resistance leg press x 5        PATIENT EDUCATION:  Education details: Patient educated on HEP, Animator Person educated: Patient Education method: Programmer, multimedia, Demonstration, and Handouts Education comprehension: verbalized understanding and returned demonstration     HOME EXERCISE PROGRAM:  06/28/21 Bridge with marching, step ups   Quad set, glute set, TRA activation, hip abduction/adduction iso 3/29 heel slides, bent knee fall outs, quadruped rocking, prone heel squeeze    Exercises - Supine Bridge  - 1 x daily - 7 x weekly - 2 sets - 10 reps - Clamshell  - 1 x daily - 7 x weekly - 2 sets - 10 reps ASSESSMENT:   CLINICAL IMPRESSION:  Today's session continued to work on phase 2 of patient's protocol.  Today patient ambulates x 1 lap in the  gym without an assistive device with emphasis on improved heel strike and toe off and increased arm swing.  She demonstrates smoother gait pattern without cane than with cane so therapist encourages her to start weaning off the  cane.  Increased step up height to 12" today and added lateral step ups for improving hip strength with lateral movement. Patient will benefit from continued skilled therapy interventions to address deficits and improve functional mobility.       OBJECTIVE IMPAIRMENTS Abnormal gait, decreased activity tolerance, decreased balance, decreased knowledge of use of DME, difficulty walking, decreased ROM, decreased strength, impaired perceived functional ability, increased muscle spasms, impaired flexibility, improper body mechanics, and pain.    ACTIVITY LIMITATIONS cleaning, community activity, driving, meal prep, occupation, laundry, yard work, shopping, and school.    PERSONAL FACTORS Behavior pattern, Past/current experiences, and Time since onset of injury/illness/exacerbation are also affecting patient's functional outcome.      REHAB POTENTIAL: Good   CLINICAL DECISION MAKING: Stable/uncomplicated   EVALUATION COMPLEXITY: Low   GOALS: Goals reviewed with patient? Yes   SHORT TERM GOALS: Target date: 07/12/2021   Patient will be independent with HEP in order to improve functional outcomes. Baseline:  Goal status: Ongoing   2.  Patient will report at least 25% improvement in symptoms for improved quality of life. Baseline:  Goal status: Ongoing   3.  Patient will ambulate with full weight bearing without pain with normalized mechanics for progression to next phase of protocol.  Baseline:  Goal status: Ongoing     LONG TERM GOALS: Target date: 08/09/2021   Patient will report at least 75% improvement in symptoms for improved quality of life. Baseline:  Goal status: Ongoing   2.  Patient will improve FOTO score by at least 20 points in order to indicate improved tolerance to activity. Baseline: complete next session Goal status: Ongoing   3.  Patient will be able to navigate stairs with reciprocal pattern without compensation in order to demonstrate improved LE  strength. Baseline:  Goal status: Ongoing   4.  Patient will demonstrate at least 4-/5 hip flexor MMT and hip abduction,extension, IR/ER of 4/5 per protocol to demonstrate improved strength for ADL.  Baseline:  Goal status: Ongoing         PLAN: PT FREQUENCY: 2x/week   PT DURATION: 8 weeks   PLANNED INTERVENTIONS: Therapeutic exercises, Therapeutic activity, Neuromuscular re-education, Balance training, Gait training, Patient/Family education, Joint manipulation, Joint mobilization, Stair training, Orthotic/Fit training, DME instructions, Aquatic Therapy, Dry Needling, Electrical stimulation, Spinal manipulation, Spinal mobilization, Cryotherapy, Moist heat, Compression bandaging, scar mobilization, Splintting, Taping, Traction, Ultrasound, Ionotophoresis 4mg /ml Dexamethasone, and Manual therapy     PLAN FOR NEXT SESSION:  follow patient protocol and progress when able; patient 7 weeks post op as of  07/11/21, DOS 05/23/21.   1:46 PM, 07/09/21 Jaylenn Baiza Small Lavinia Mcneely MPT [facility] [place] [phone] [phone]

## 2021-07-10 ENCOUNTER — Other Ambulatory Visit (HOSPITAL_COMMUNITY): Payer: Self-pay

## 2021-07-10 DIAGNOSIS — Z01419 Encounter for gynecological examination (general) (routine) without abnormal findings: Secondary | ICD-10-CM | POA: Diagnosis not present

## 2021-07-10 DIAGNOSIS — Z1212 Encounter for screening for malignant neoplasm of rectum: Secondary | ICD-10-CM | POA: Diagnosis not present

## 2021-07-10 DIAGNOSIS — Z1231 Encounter for screening mammogram for malignant neoplasm of breast: Secondary | ICD-10-CM | POA: Diagnosis not present

## 2021-07-10 MED ORDER — VITAFOL ULTRA 29-0.6-0.4-200 MG PO CAPS
1.0000 | ORAL_CAPSULE | Freq: Every day | ORAL | 4 refills | Status: DC
  Filled 2021-07-10: qty 30, 30d supply, fill #0
  Filled 2021-11-30: qty 30, 30d supply, fill #1

## 2021-07-11 ENCOUNTER — Other Ambulatory Visit (HOSPITAL_COMMUNITY): Payer: Self-pay

## 2021-07-12 ENCOUNTER — Ambulatory Visit (HOSPITAL_COMMUNITY): Payer: 59

## 2021-07-12 DIAGNOSIS — M25561 Pain in right knee: Secondary | ICD-10-CM

## 2021-07-12 DIAGNOSIS — M6281 Muscle weakness (generalized): Secondary | ICD-10-CM | POA: Diagnosis not present

## 2021-07-12 DIAGNOSIS — M25572 Pain in left ankle and joints of left foot: Secondary | ICD-10-CM

## 2021-07-12 DIAGNOSIS — R2689 Other abnormalities of gait and mobility: Secondary | ICD-10-CM | POA: Diagnosis not present

## 2021-07-12 DIAGNOSIS — M25551 Pain in right hip: Secondary | ICD-10-CM

## 2021-07-12 DIAGNOSIS — R29898 Other symptoms and signs involving the musculoskeletal system: Secondary | ICD-10-CM

## 2021-07-12 NOTE — Therapy (Signed)
OUTPATIENT PHYSICAL THERAPY TREATMENT NOTE   Patient Name: Virginia Hawkins MRN: [id number] DOB:November 12, 1970, 51 y.o., female Today's Date: 07/12/2021  PCP: Danae Chen, MD REFERRING PROVIDER: Lily Kocher, MD  Next Apt scheduled with MD Melvyn Neth: 07/13/21  PT End of Session - 07/12/21 0946     Visit Number 8    Number of Visits 16    Date for PT Re-Evaluation 08/09/21    Authorization Type [place] [employer] No auth, no VL, med necessity after 25th visit)    Authorization - Visit Number 7    Authorization - Number of Visits 25    PT Start Time 0945    PT Stop Time 1025    PT Time Calculation (min) 40 min    Activity Tolerance Patient tolerated treatment well    Behavior During Therapy [facility] for tasks assessed/performed                Past Medical History:  Diagnosis Date   Fibromyalgia    Hypertension    Ovarian cyst    Vaginal Pap smear, abnormal    Past Surgical History:  Procedure Laterality Date   CYST EXCISION     FOOT SURGERY Left 2020   for plantar fasciitis   SALPINGECTOMY     Patient Active Problem List   Diagnosis Date Noted   Amenorrhea 05/01/2020   Plantar fasciitis 09/01/2017   History of ovarian cyst 03/23/2015   History of abnormal cervical Pap smear 03/23/2015   Fibromyalgia 03/23/2015   Hypertension, essential 10/05/2014   Vitiligo 10/05/2014    REFERRING DIAG: A21.308 s/p arthroscopy of hip  THERAPY DIAG:  Pain in right hip  Acute pain of right knee  Pain in left ankle and joints of left foot  Muscle weakness (generalized)  Other abnormalities of gait and mobility  Other symptoms and signs involving the musculoskeletal system  PERTINENT HISTORY: DOS 05/23/21; Fall Sept. 2022  PRECAUTIONS: PRECAUTIONS: Other: Arthroscopic hip labral repair with femoral osteoplasty - see protocol for guideline and precautions in media   WEIGHT BEARING RESTRICTIONS Yes Week 1-4 50% flat foot weight bearing, WBAT >4  weeks  SUBJECTIVE: had to reschedule appointment with Dr. Thurston Hole for 2 more weeks regarding knee; sees hip MD tomorrow.  Patient walks into PT without her cane today.  Still sore in her groin area.   PAIN:  Are you having pain? Yes: NPRS scale: 5/10 Pain location: R anterior hip Pain description: tender Aggravating factors: movement, flexing hip Relieving factors: rest     PATIENT GOALS to walk better and full mobility     OBJECTIVE:        PATIENT SURVEYS:  FOTO done 06/26/21 score 51      LE ROM:   Passive ROM Right 06/14/2021 Left 06/14/2021 Right  07/05/21  Hip flexion 90   93  Hip extension Lacking 7   0  Hip abduction       Hip adduction       Hip internal rotation       Hip external rotation       Knee flexion     93  Knee extension     0  Ankle dorsiflexion       Ankle plantarflexion       Ankle inversion       Ankle eversion        (Blank rows = not tested)   LE MMT:   MMT Right 06/14/2021 Left 06/14/2021 Right 07/09/21 Left  07/09/21  Hip flexion   5/5 4/5 5/5  Hip extension        Hip abduction        Hip adduction        Hip internal rotation        Hip external rotation        Knee flexion 5/5 5/5    Knee extension 4+/5 5/5 4+/5   Ankle dorsiflexion 5/5 5/5    Ankle plantarflexion        Ankle inversion        Ankle eversion         (Blank rows = not tested)       FUNCTIONAL TESTS:  Transfers: labored, unsteady   GAIT: Distance walked: 100 Assistive device utilized: cane Level of assistance: Modified independence Comments: trunk flexed slightly       TODAY'S TREATMENT:  07/12/21 Standing heel raises 15 x 5" Slant board 5 x 30" Step up 12 in step 2' with no HHA  Lateral step ups 12 inch 1' no HHA (painful R knee) Tandem stance 2 x 30"on foam with intermittent HHA Functional Squats 2 x 10  Upright bike x 6 min no resistance.  SLS 3 x 30" each Hip vector Right x 8               07/09/21 Upright bike no resistance  x 5" Standing  Heel raises 15 x 5" Slant board 5 x 30"  Step up 12 in step 2' with 1 HHA  Lateral step ups 8 inch 2'   Tandem stance 2x 30". Tandem stance on foam with intermittent HHA  Gait train 1 lap in gym with focus on form; arm swing.   Functional Squats 2 x 10         07/05/21:   Upright bike no resistance 4'  Standing:    Heel raises 15x 5"   Squat front of chair 2x 10 cueing for mechanics   Step up 6in step 2x 15 with 1 HHA   Step down 4in 15 1 HHA   Tandem stance 2x 30".   Tandem stance on foam with intermittent HHA          PATIENT EDUCATION:  Education details: Patient educated on HEP, Animator Person educated: Patient Education method: Programmer, multimedia, Demonstration, and Handouts Education comprehension: verbalized understanding and returned demonstration     HOME EXERCISE PROGRAM:  06/28/21 Bridge with marching, step ups   Quad set, glute set, TRA activation, hip abduction/adduction iso 3/29 heel slides, bent knee fall outs, quadruped rocking, prone heel squeeze    Exercises - Supine Bridge  - 1 x daily - 7 x weekly - 2 sets - 10 reps - Clamshell  - 1 x daily - 7 x weekly - 2 sets - 10 reps ASSESSMENT:   CLINICAL IMPRESSION:  Today's session continued to work on phase 2 of patient's protocol. Increased lateral step ups to 12" step today however had to discontinue due to increased Right knee pain.  Patient walks in the gym today without her cane. Added hip vectors and SLS to treatment today to continue to progress hip strengthening and improve balance.  Patient reports mildly decreased pain after treatment to 4/10.  Patient will benefit from continued skilled therapy interventions to address deficits and improve functional mobility.       OBJECTIVE IMPAIRMENTS Abnormal gait, decreased activity tolerance, decreased balance, decreased knowledge of use of DME, difficulty walking, decreased ROM, decreased strength, impaired perceived functional  ability, increased muscle spasms, impaired flexibility, improper body mechanics, and pain.    ACTIVITY LIMITATIONS cleaning, community activity, driving, meal prep, occupation, laundry, yard work, shopping, and school.    PERSONAL FACTORS Behavior pattern, Past/current experiences, and Time since onset of injury/illness/exacerbation are also affecting patient's functional outcome.      REHAB POTENTIAL: Good   CLINICAL DECISION MAKING: Stable/uncomplicated   EVALUATION COMPLEXITY: Low   GOALS: Goals reviewed with patient? Yes   SHORT TERM GOALS: Target date: 07/12/2021   Patient will be independent with HEP in order to improve functional outcomes. Baseline:  Goal status: Ongoing   2.  Patient will report at least 25% improvement in symptoms for improved quality of life. Baseline:  Goal status: Ongoing   3.  Patient will ambulate with full weight bearing without pain with normalized mechanics for progression to next phase of protocol.  Baseline:  Goal status: Ongoing     LONG TERM GOALS: Target date: 08/09/2021   Patient will report at least 75% improvement in symptoms for improved quality of life. Baseline:  Goal status: Ongoing   2.  Patient will improve FOTO score by at least 20 points in order to indicate improved tolerance to activity. Baseline: complete next session Goal status: Ongoing   3.  Patient will be able to navigate stairs with reciprocal pattern without compensation in order to demonstrate improved LE strength. Baseline:  Goal status: Ongoing   4.  Patient will demonstrate at least 4-/5 hip flexor MMT and hip abduction,extension, IR/ER of 4/5 per protocol to demonstrate improved strength for ADL.  Baseline:  Goal status: Ongoing         PLAN: PT FREQUENCY: 2x/week   PT DURATION: 8 weeks   PLANNED INTERVENTIONS: Therapeutic exercises, Therapeutic activity, Neuromuscular re-education, Balance training, Gait training, Patient/Family education, Joint  manipulation, Joint mobilization, Stair training, Orthotic/Fit training, DME instructions, Aquatic Therapy, Dry Needling, Electrical stimulation, Spinal manipulation, Spinal mobilization, Cryotherapy, Moist heat, Compression bandaging, scar mobilization, Splintting, Taping, Traction, Ultrasound, Ionotophoresis 4mg /ml Dexamethasone, and Manual therapy     PLAN FOR NEXT SESSION:  follow patient protocol and progress when able; patient 7 weeks post op as of  07/11/21, DOS 05/23/21. She sees her surgeon on Friday 07/13/21  10:25 AM, 07/12/21 Jermia Rigsby Small Hadessah Grennan MPT [facility] [place] [phone]

## 2021-07-16 ENCOUNTER — Encounter (HOSPITAL_COMMUNITY): Payer: 59 | Admitting: Physical Therapy

## 2021-07-18 ENCOUNTER — Encounter (HOSPITAL_COMMUNITY): Payer: Self-pay

## 2021-07-18 ENCOUNTER — Ambulatory Visit (HOSPITAL_COMMUNITY): Payer: 59

## 2021-07-18 DIAGNOSIS — R29898 Other symptoms and signs involving the musculoskeletal system: Secondary | ICD-10-CM | POA: Diagnosis not present

## 2021-07-18 DIAGNOSIS — M25561 Pain in right knee: Secondary | ICD-10-CM | POA: Diagnosis not present

## 2021-07-18 DIAGNOSIS — M25551 Pain in right hip: Secondary | ICD-10-CM

## 2021-07-18 DIAGNOSIS — R2689 Other abnormalities of gait and mobility: Secondary | ICD-10-CM | POA: Diagnosis not present

## 2021-07-18 DIAGNOSIS — M6281 Muscle weakness (generalized): Secondary | ICD-10-CM | POA: Diagnosis not present

## 2021-07-18 DIAGNOSIS — M25572 Pain in left ankle and joints of left foot: Secondary | ICD-10-CM | POA: Diagnosis not present

## 2021-07-18 NOTE — Therapy (Signed)
?OUTPATIENT PHYSICAL THERAPY TREATMENT NOTE ? ? ?Patient Name: Virginia Hawkins ?MRN: [id number] ?DOB:November 01, 1970, 51 y.o., female ?Today's Date: 07/18/2021 ? ?PCP: Noralee Stain, MD ?REFERRING PROVIDER: Rosalio Macadamia, MD ? ?Next Apt scheduled with MD Lewis: End of April ? PT End of Session - 07/18/21 1408   ? ? Visit Number 9   ? Number of Visits 16   ? Date for PT Re-Evaluation 08/09/21   ? Authorization Type [place] [employer] No auth, no VL, med necessity after 25th visit)   ? Authorization - Visit Number 8   ? Authorization - Number of Visits 25   ? PT Start Time 0350   ? PT Stop Time 0938   ? PT Time Calculation (min) 39 min   ? Activity Tolerance Patient tolerated treatment well   ? Behavior During Therapy [facility] for tasks assessed/performed   ? ?  ?  ? ?  ? ? ? ? ? ? ?Past Medical History:  ?Diagnosis Date  ? Fibromyalgia   ? Hypertension   ? Ovarian cyst   ? Vaginal Pap smear, abnormal   ? ?Past Surgical History:  ?Procedure Laterality Date  ? CYST EXCISION    ? FOOT SURGERY Left 2020  ? for plantar fasciitis  ? SALPINGECTOMY    ? ?Patient Active Problem List  ? Diagnosis Date Noted  ? Amenorrhea 05/01/2020  ? Plantar fasciitis 09/01/2017  ? History of ovarian cyst 03/23/2015  ? History of abnormal cervical Pap smear 03/23/2015  ? Fibromyalgia 03/23/2015  ? Hypertension, essential 10/05/2014  ? Vitiligo 10/05/2014  ? ? ?REFERRING DIAG: Z98.890 s/p arthroscopy of hip ? ?THERAPY DIAG:  ?Pain in right hip ? ?Acute pain of right knee ? ?PERTINENT HISTORY: DOS 05/23/21; Fall Sept. 2022 ? ?PRECAUTIONS: PRECAUTIONS: Other: Arthroscopic hip labral repair with femoral osteoplasty - see protocol for guideline and precautions in media ?  ?WEIGHT BEARING RESTRICTIONS Yes Week 1-4 50% flat foot weight bearing, WBAT >4 weeks ? ?SUBJECTIVE: Pt stated MD happy with progress.  Pt arrived without brace or AD.  Pain scale 5/10 tender achey pain that is intermittent pain Rt knee and groin.   ? ?PAIN:  ?Are you having  pain? Yes: NPRS scale: 5/10 ?Pain location: R anterior hip ?Pain description: tender ?Aggravating factors: movement, flexing hip ?Relieving factors: rest ? ? ?  ?PATIENT GOALS to walk better and full mobility ?  ?  ?OBJECTIVE:  ?  ?  ?  ?PATIENT SURVEYS:  ?FOTO done 06/26/21 score 51 ?  ? ?  ?LE ROM: ?  ?Passive ROM Right ?06/14/2021 Left ?06/14/2021 Right  ?07/05/21  ?Hip flexion 90   93  ?Hip extension Lacking 7   0  ?Hip abduction       ?Hip adduction       ?Hip internal rotation       ?Hip external rotation       ?Knee flexion     93  ?Knee extension     0  ?Ankle dorsiflexion       ?Ankle plantarflexion       ?Ankle inversion       ?Ankle eversion       ? (Blank rows = not tested) ?  ?LE MMT: ?  ?MMT Right ?06/14/2021 Left ?06/14/2021 Right ?07/09/21 Left  ?07/09/21  ?Hip flexion   5/5 4/5 5/5  ?Hip extension        ?Hip abduction        ?Hip adduction        ?  Hip internal rotation        ?Hip external rotation        ?Knee flexion 5/5 5/5    ?Knee extension 4+/5 5/5 4+/5   ?Ankle dorsiflexion 5/5 5/5    ?Ankle plantarflexion        ?Ankle inversion        ?Ankle eversion        ? (Blank rows = not tested) ?  ?  ?  ?FUNCTIONAL TESTS:  ?Transfers: labored, unsteady ?  ?GAIT: ?Distance walked: 100 ?Assistive device utilized: cane ?Level of assistance: Modified independence ?Comments: trunk flexed slightly ?  ?  ?  ?TODAY'S TREATMENT: ?07/18/21: ?Elliptical x 3 L1 ?Squat to heel raise no HHA 3" holds each 10x ?7in stairs reciprocal pattern 5RT ?Lateral step ups 6in  ?Valgus isometric abd foot on 12in step RTB resistance 10x 5" ?Vector Stance 5x 5" ?Tandem stance on foam 1x 30" ?Tandem stance on foam with head turns 10x 2 sets ? ?Leg press 3Pl 2x 10 controlled movements ?Bodycraft walkout 5RT 3PL; 2PL with sidestep ? ?07/12/21 ?Standing heel raises 15 x 5" ?Slant board 5 x 30" ?Step up 12 in step 2' with no HHA  ?Lateral step ups 12 inch 1' no HHA (painful R knee) ?Tandem stance 2 x 30"on foam with intermittent  HHA ?Functional Squats 2 x 10  ?Upright bike x 6 min no resistance.  ?SLS 3 x 30" each ?Hip vector Right x 8 ? ? ?07/09/21 ?Upright bike no resistance x 5" ?Standing  ?Heel raises 15 x 5" ?Slant board 5 x 30" ? ?Step up 12 in step 2' with 1 HHA ? ?Lateral step ups 8 inch 2'  ? ?Tandem stance 2x 30". ?Tandem stance on foam with intermittent HHA ? ?Gait train 1 lap in gym with focus on form; arm swing.  ? ?Functional Squats 2 x 10  ? ? ? ? ? ? ? ?07/05/21: ?  Upright bike no resistance 4' ? Standing:  ?  Heel raises 15x 5" ?  Squat front of chair 2x 10 cueing for mechanics ?  Step up 6in step 2x 15 with 1 HHA ?  Step down 4in 15 1 HHA ?  Tandem stance 2x 30". ?  Tandem stance on foam with intermittent HHA ?   ? ? ?  ?  ?PATIENT EDUCATION:  ?Education details: Patient educated on HEP, gait mechanics ?Person educated: Patient ?Education method: Explanation, Demonstration, and Handouts ?Education comprehension: verbalized understanding and returned demonstration ?  ?  ?HOME EXERCISE PROGRAM: ? ?06/28/21 ?Bridge with marching, step ups  ? ?Quad set, glute set, TRA activation, hip abduction/adduction iso 3/29 heel slides, bent knee fall outs, quadruped rocking, prone heel squeeze ? ? ? ?Exercises ?- Supine Bridge  - 1 x daily - 7 x weekly - 2 sets - 10 reps ?- Clamshell  - 1 x daily - 7 x weekly - 2 sets - 10 reps ?ASSESSMENT: ?  ?CLINICAL IMPRESSION: ? Pt at 8 week post-op, with session focus phase 2 of protocol.  Pt reports knee pain with squats, cueing for mechanics for increased gluteal activation.  Noted Rt knee valgus during stairs, added isometric gluteal and walkout exercises for gluteal strengthening.  Pt tolerated well to session, was limited with Rt knee pain and reports of groin stretches.  Presents with good balance, progressed to head turns with tandem stance. ? ? ?  ?OBJECTIVE IMPAIRMENTS Abnormal gait, decreased activity tolerance, decreased balance, decreased knowledge of use of  DME, difficulty walking,  decreased ROM, decreased strength, impaired perceived functional ability, increased muscle spasms, impaired flexibility, improper body mechanics, and pain.  ?  ?ACTIVITY LIMITATIONS cleaning, community activity, driving, meal prep, occupation, laundry, yard work, shopping, and school.  ?  ?PERSONAL FACTORS Behavior pattern, Past/current experiences, and Time since onset of injury/illness/exacerbation are also affecting patient's functional outcome.  ?  ?  ?REHAB POTENTIAL: Good ?  ?CLINICAL DECISION MAKING: Stable/uncomplicated ?  ?EVALUATION COMPLEXITY: Low ?  ?GOALS: ?Goals reviewed with patient? Yes ?  ?SHORT TERM GOALS: Target date: 07/12/2021 ?  ?Patient will be independent with HEP in order to improve functional outcomes. ?Baseline:  ?Goal status: Ongoing ?  ?2.  Patient will report at least 25% improvement in symptoms for improved quality of life. ?Baseline:  ?Goal status: Ongoing ?  ?3.  Patient will ambulate with full weight bearing without pain with normalized mechanics for progression to next phase of protocol.  ?Baseline:  ?Goal status: Ongoing ?  ?  ?LONG TERM GOALS: Target date: 08/09/2021 ?  ?Patient will report at least 75% improvement in symptoms for improved quality of life. ?Baseline:  ?Goal status: Ongoing ?  ?2.  Patient will improve FOTO score by at least 20 points in order to indicate improved tolerance to activity. ?Baseline: complete next session ?Goal status: Ongoing ?  ?3.  Patient will be able to navigate stairs with reciprocal pattern without compensation in order to demonstrate improved LE strength. ?Baseline:  ?Goal status: Ongoing ?  ?4.  Patient will demonstrate at least 4-/5 hip flexor MMT and hip abduction,extension, IR/ER of 4/5 per protocol to demonstrate improved strength for ADL.  ?Baseline:  ?Goal status: Ongoing ?  ?  ?  ?  ?PLAN: ?PT FREQUENCY: 2x/week ?  ?PT DURATION: 8 weeks ?  ?PLANNED INTERVENTIONS: Therapeutic exercises, Therapeutic activity, Neuromuscular re-education,  Balance training, Gait training, Patient/Family education, Joint manipulation, Joint mobilization, Stair training, Orthotic/Fit training, DME instructions, Aquatic Therapy, Dry Needling, Electrical stimulation, Spinal ma

## 2021-07-24 ENCOUNTER — Ambulatory Visit (HOSPITAL_COMMUNITY): Payer: 59 | Attending: Orthopedic Surgery

## 2021-07-24 DIAGNOSIS — M25551 Pain in right hip: Secondary | ICD-10-CM | POA: Diagnosis not present

## 2021-07-24 DIAGNOSIS — R2689 Other abnormalities of gait and mobility: Secondary | ICD-10-CM | POA: Insufficient documentation

## 2021-07-24 DIAGNOSIS — M25572 Pain in left ankle and joints of left foot: Secondary | ICD-10-CM | POA: Insufficient documentation

## 2021-07-24 DIAGNOSIS — M25561 Pain in right knee: Secondary | ICD-10-CM | POA: Diagnosis not present

## 2021-07-24 DIAGNOSIS — R29898 Other symptoms and signs involving the musculoskeletal system: Secondary | ICD-10-CM | POA: Diagnosis not present

## 2021-07-24 DIAGNOSIS — M6281 Muscle weakness (generalized): Secondary | ICD-10-CM | POA: Diagnosis not present

## 2021-07-24 NOTE — Therapy (Addendum)
OUTPATIENT PHYSICAL THERAPY TREATMENT NOTE   Patient Name: Virginia Hawkins MRN: [id number] DOB:1970/05/15, 51 y.o., female Today's Date: 07/24/2021  PCP: Danae Chen, MD REFERRING PROVIDER: Lily Kocher, MD  Next Apt scheduled with MD Melvyn Neth: End of April  PT End of Session - 07/24/21 1146     Visit Number 10    Number of Visits 16    Date for PT Re-Evaluation 08/09/21    Authorization Type [place] [facility] No auth, no VL, med necessity after 25th visit)    Authorization - Visit Number 9    Authorization - Number of Visits 25    PT Start Time 1046    PT Stop Time 1115    PT Time Calculation (min) 29 min    Activity Tolerance Patient tolerated treatment well    Behavior During Therapy [facility] for tasks assessed/performed                  Past Medical History:  Diagnosis Date   Fibromyalgia    Hypertension    Ovarian cyst    Vaginal Pap smear, abnormal    Past Surgical History:  Procedure Laterality Date   CYST EXCISION     FOOT SURGERY Left 2020   for plantar fasciitis   SALPINGECTOMY     Patient Active Problem List   Diagnosis Date Noted   Amenorrhea 05/01/2020   Plantar fasciitis 09/01/2017   History of ovarian cyst 03/23/2015   History of abnormal cervical Pap smear 03/23/2015   Fibromyalgia 03/23/2015   Hypertension, essential 10/05/2014   Vitiligo 10/05/2014    REFERRING DIAG: W09.811 s/p arthroscopy of hip  THERAPY DIAG:  Pain in right hip  Acute pain of right knee  Other symptoms and signs involving the musculoskeletal system  Other abnormalities of gait and mobility  Pain in left ankle and joints of left foot  Muscle weakness (generalized)  PERTINENT HISTORY: DOS 05/23/21; Fall Sept. 2022  PRECAUTIONS: PRECAUTIONS: Other: Arthroscopic hip labral repair with femoral osteoplasty - see protocol for guideline and precautions in media   WEIGHT BEARING RESTRICTIONS Yes Week 1-4 50% flat foot weight bearing, WBAT >4  weeks  SUBJECTIVE: Patient reports she is very sore from last treatment; her knees are also bothering her quite a bit today.   PAIN:  Are you having pain? Yes: NPRS scale: 8/10 Pain location: R anterior hip Pain description: tender Aggravating factors: movement, flexing hip Relieving factors: rest     PATIENT GOALS to walk better and full mobility     OBJECTIVE:        PATIENT SURVEYS:  FOTO done 06/26/21 score 51      LE ROM:   Passive ROM Right 06/14/2021 Left 06/14/2021 Right  07/05/21  Hip flexion 90   93  Hip extension Lacking 7   0  Hip abduction       Hip adduction       Hip internal rotation       Hip external rotation       Knee flexion     93  Knee extension     0  Ankle dorsiflexion       Ankle plantarflexion       Ankle inversion       Ankle eversion        (Blank rows = not tested)   LE MMT:   MMT Right 06/14/2021 Left 06/14/2021 Right 07/09/21 Left  07/09/21  Hip flexion   5/5 4/5 5/5  Hip  extension        Hip abduction        Hip adduction        Hip internal rotation        Hip external rotation        Knee flexion 5/5 5/5    Knee extension 4+/5 5/5 4+/5   Ankle dorsiflexion 5/5 5/5    Ankle plantarflexion        Ankle inversion        Ankle eversion         (Blank rows = not tested)       FUNCTIONAL TESTS:  Transfers: labored, unsteady   GAIT: Distance walked: 100 Assistive device utilized: cane Level of assistance: Modified independence Comments: trunk flexed slightly       TODAY'S TREATMENT: 07/24/21 Upright bike x 5' Squat 2 x 10 Heel raises x 20 no UE 12" step up 2 x 10 Side stepping with GTB x 20 ft x 4 passes Plank x 20" x 3 Side plank 20" x 2 each    07/18/21: Elliptical x 3 L1 Squat to heel raise no HHA 3" holds each 10x 7in stairs reciprocal pattern 5RT Lateral step ups 6in  Valgus isometric abd foot on 12in step RTB resistance 10x 5" Vector Stance 5x 5" Tandem stance on foam 1x 30" Tandem stance on foam  with head turns 10x 2 sets  Leg press 3Pl 2x 10 controlled movements Bodycraft walkout 5RT 3PL; 2PL with sidestep  07/12/21 Standing heel raises 15 x 5" Slant board 5 x 30" Step up 12 in step 2' with no HHA  Lateral step ups 12 inch 1' no HHA (painful R knee) Tandem stance 2 x 30"on foam with intermittent HHA Functional Squats 2 x 10  Upright bike x 6 min no resistance.  SLS 3 x 30" each Hip vector Right x 8   07/09/21 Upright bike no resistance x 5" Standing  Heel raises 15 x 5" Slant board 5 x 30"  Step up 12 in step 2' with 1 HHA  Lateral step ups 8 inch 2'   Tandem stance 2x 30". Tandem stance on foam with intermittent HHA  Gait train 1 lap in gym with focus on form; arm swing.   Functional Squats 2 x 10         07/05/21:   Upright bike no resistance 4'  Standing:    Heel raises 15x 5"   Squat front of chair 2x 10 cueing for mechanics   Step up 6in step 2x 15 with 1 HHA   Step down 4in 15 1 HHA   Tandem stance 2x 30".   Tandem stance on foam with intermittent HHA          PATIENT EDUCATION:  Education details: Patient educated on HEP, Animator Person educated: Patient Education method: Programmer, multimedia, Demonstration, and Handouts Education comprehension: verbalized understanding and returned demonstration     HOME EXERCISE PROGRAM:  06/28/21 Bridge with marching, step ups   Quad set, glute set, TRA activation, hip abduction/adduction iso 3/29 heel slides, bent knee fall outs, quadruped rocking, prone heel squeeze    Exercises - Supine Bridge  - 1 x daily - 7 x weekly - 2 sets - 10 reps - Clamshell  - 1 x daily - 7 x weekly - 2 sets - 10 reps ASSESSMENT:   CLINICAL IMPRESSION: Patient will be s/p 9 weeks tomorrow 07/25/21.  Added core stabilization exercise today; plank and side plank. Patient  fatigues quickly with planks but not painful.  Patient reports knee pain with step ups and side stepping in a semi squat position today.  No increased  pain at the completion of treatment today.  Therapist recommended continued ice and Biofreeze to help manage pain.  Patient will benefit from continued skilled therapy interventions to address deficits and improve functional mobility     OBJECTIVE IMPAIRMENTS Abnormal gait, decreased activity tolerance, decreased balance, decreased knowledge of use of DME, difficulty walking, decreased ROM, decreased strength, impaired perceived functional ability, increased muscle spasms, impaired flexibility, improper body mechanics, and pain.    ACTIVITY LIMITATIONS cleaning, community activity, driving, meal prep, occupation, laundry, yard work, shopping, and school.    PERSONAL FACTORS Behavior pattern, Past/current experiences, and Time since onset of injury/illness/exacerbation are also affecting patient's functional outcome.      REHAB POTENTIAL: Good   CLINICAL DECISION MAKING: Stable/uncomplicated   EVALUATION COMPLEXITY: Low   GOALS: Goals reviewed with patient? Yes   SHORT TERM GOALS: Target date: 07/12/2021   Patient will be independent with HEP in order to improve functional outcomes. Baseline:  Goal status: met 2.  Patient will report at least 25% improvement in symptoms for improved quality of life. Baseline:  Goal status: Ongoing   3.  Patient will ambulate with full weight bearing without pain with normalized mechanics for progression to next phase of protocol.  Baseline:  Goal status: partially met; she is ambulating without AD full weight bearing but has some continued pain and soreness.      LONG TERM GOALS: Target date: 08/09/2021   Patient will report at least 75% improvement in symptoms for improved quality of life. Baseline:  Goal status: Ongoing   2.  Patient will improve FOTO score by at least 20 points in order to indicate improved tolerance to activity. Baseline: complete next session Goal status: Ongoing   3.  Patient will be able to navigate stairs with  reciprocal pattern without compensation in order to demonstrate improved LE strength. Baseline:  Goal status: Ongoing   4.  Patient will demonstrate at least 4-/5 hip flexor MMT and hip abduction,extension, IR/ER of 4/5 per protocol to demonstrate improved strength for ADL.  Baseline:  Goal status: Ongoing         PLAN: PT FREQUENCY: 2x/week   PT DURATION: 8 weeks   PLANNED INTERVENTIONS: Therapeutic exercises, Therapeutic activity, Neuromuscular re-education, Balance training, Gait training, Patient/Family education, Joint manipulation, Joint mobilization, Stair training, Orthotic/Fit training, DME instructions, Aquatic Therapy, Dry Needling, Electrical stimulation, Spinal manipulation, Spinal mobilization, Cryotherapy, Moist heat, Compression bandaging, scar mobilization, Splintting, Taping, Traction, Ultrasound, Ionotophoresis 4mg /ml Dexamethasone, and Manual therapy     PLAN FOR NEXT SESSION: Progress note; On week 9 07/25/21: check for full AROM, pain free normalized gait, hip flexor strength at 4-/5 and 4/5 for abd, add, extension and IR/ER strength prior progressing to phase 3 week 9-12.  11:53 AM, 07/24/21 Yenny Kosa Small Fabricio Endsley MPT [facility] [place] [phone] [phone]

## 2021-07-26 ENCOUNTER — Ambulatory Visit (HOSPITAL_COMMUNITY): Payer: 59

## 2021-07-26 DIAGNOSIS — M25561 Pain in right knee: Secondary | ICD-10-CM

## 2021-07-26 DIAGNOSIS — R2689 Other abnormalities of gait and mobility: Secondary | ICD-10-CM

## 2021-07-26 DIAGNOSIS — M25551 Pain in right hip: Secondary | ICD-10-CM | POA: Diagnosis not present

## 2021-07-26 DIAGNOSIS — R29898 Other symptoms and signs involving the musculoskeletal system: Secondary | ICD-10-CM

## 2021-07-26 DIAGNOSIS — M25572 Pain in left ankle and joints of left foot: Secondary | ICD-10-CM | POA: Diagnosis not present

## 2021-07-26 DIAGNOSIS — M6281 Muscle weakness (generalized): Secondary | ICD-10-CM | POA: Diagnosis not present

## 2021-07-26 NOTE — Therapy (Signed)
OUTPATIENT PHYSICAL THERAPY TREATMENT NOTE  Progress Note Reporting Period 06/14/21 to 07/26/21  See note below for Objective Data and Assessment of Progress/Goals.       Patient Name: Virginia Hawkins MRN: [id number] DOB:Nov 09, 1970, 51 y.o., female Today's Date: 07/26/2021  PCP: Danae Chen, MD REFERRING PROVIDER: Lily Kocher, MD  Next Apt scheduled with MD Melvyn Neth: End of April  PT End of Session - 07/26/21 1159     Visit Number 11    Number of Visits 16    Date for PT Re-Evaluation 08/09/21    Authorization Type [place] [employer] No auth, no VL, med necessity after 25th visit)    Authorization - Visit Number 9    Authorization - Number of Visits 25    PT Start Time 1117    PT Stop Time 1200    PT Time Calculation (min) 43 min    Activity Tolerance Patient tolerated treatment well    Behavior During Therapy [facility] for tasks assessed/performed                   Past Medical History:  Diagnosis Date   Fibromyalgia    Hypertension    Ovarian cyst    Vaginal Pap smear, abnormal    Past Surgical History:  Procedure Laterality Date   CYST EXCISION     FOOT SURGERY Left 2020   for plantar fasciitis   SALPINGECTOMY     Patient Active Problem List   Diagnosis Date Noted   Amenorrhea 05/01/2020   Plantar fasciitis 09/01/2017   History of ovarian cyst 03/23/2015   History of abnormal cervical Pap smear 03/23/2015   Fibromyalgia 03/23/2015   Hypertension, essential 10/05/2014   Vitiligo 10/05/2014    REFERRING DIAG: W09.811 s/p arthroscopy of hip  THERAPY DIAG:  Pain in right hip  Acute pain of right knee  Other symptoms and signs involving the musculoskeletal system  Other abnormalities of gait and mobility  PERTINENT HISTORY: DOS 05/23/21; Fall Sept. 2022  PRECAUTIONS: PRECAUTIONS: Other: Arthroscopic hip labral repair with femoral osteoplasty - see protocol for guideline and precautions in media   WEIGHT BEARING RESTRICTIONS  Yes Week 1-4 50% flat foot weight bearing, WBAT >4 weeks  SUBJECTIVE: Patient reports she is still sore today but slightly less; knees are a little bit better today.  PAIN:  Are you having pain? Yes: NPRS scale: 6/10 Pain location: R anterior hip Pain description: tender Aggravating factors: movement, flexing hip Relieving factors: rest     PATIENT GOALS to walk better and full mobility     OBJECTIVE:        PATIENT SURVEYS:  FOTO done 06/26/21 score 51      LE ROM:   Passive ROM Right 06/14/2021 Left 06/14/2021 Right  07/05/21 Right 07/26/21  Hip flexion 90   93 110 (with knee flexed)  Hip extension Lacking 7   0   Hip abduction        Hip adduction        Hip internal rotation      38  Hip external rotation      24  Knee flexion     93 125  Knee extension     0   Ankle dorsiflexion        Ankle plantarflexion        Ankle inversion        Ankle eversion         (Blank rows = not tested)  LE MMT:   MMT Right 06/14/2021 Left 06/14/2021 Right 07/09/21 Left  07/09/21 Right 07/26/21  Hip flexion   5/5 4/5 5/5   Hip extension       3-  Hip abduction         Hip adduction       3-  Hip internal rotation         Hip external rotation         Knee flexion 5/5 5/5     Knee extension 4+/5 5/5 4+/5    Ankle dorsiflexion 5/5 5/5     Ankle plantarflexion         Ankle inversion         Ankle eversion          (Blank rows = not tested)       FUNCTIONAL TESTS:  Transfers: labored, unsteady   GAIT: Distance walked: 100 Assistive device utilized: cane Level of assistance: Modified independence Comments: trunk flexed slightly       TODAY'S TREATMENT: 07/26/21 Upright bike 5' - Supine Single Knee to Chest Stretch  - 1 x daily - 7 x weekly - 1 sets - 10 reps - Prone Hip Extension  - 1 x daily - 7 x weekly - 1 sets - 10 reps - Prone Press Up  - 1 x daily - 7 x weekly - 1 sets - 10 reps  Sit stand to up on toes 2 x 10 Tandem stance on foam 2 x 1' each Hip  vectors x 10 each dide  07/24/21 Upright bike x 5' Squat 2 x 10 Heel raises x 20 no UE 12" step up 2 x 10 Side stepping with GTB x 20 ft x 4 passes Plank x 20" x 3 Side plank 20" x 2 each    07/18/21: Elliptical x 3 L1 Squat to heel raise no HHA 3" holds each 10x 7in stairs reciprocal pattern 5RT Lateral step ups 6in  Valgus isometric abd foot on 12in step RTB resistance 10x 5" Vector Stance 5x 5" Tandem stance on foam 1x 30" Tandem stance on foam with head turns 10x 2 sets  Leg press 3Pl 2x 10 controlled movements [employer] walkout 5RT 3PL; 2PL with sidestep  07/12/21 Standing heel raises 15 x 5" Slant board 5 x 30" Step up 12 in step 2' with no HHA  Lateral step ups 12 inch 1' no HHA (painful R knee) Tandem stance 2 x 30"on foam with intermittent HHA Functional Squats 2 x 10  Upright bike x 6 min no resistance.  SLS 3 x 30" each Hip vector Right x 8   07/09/21 Upright bike no resistance x 5" Standing  Heel raises 15 x 5" Slant board 5 x 30"  Step up 12 in step 2' with 1 HHA  Lateral step ups 8 inch 2'   Tandem stance 2x 30". Tandem stance on foam with intermittent HHA  Gait train 1 lap in gym with focus on form; arm swing.   Functional Squats 2 x 10         07/05/21:   Upright bike no resistance 4'  Standing:    Heel raises 15x 5"   Squat front of chair 2x 10 cueing for mechanics   Step up 6in step 2x 15 with 1 HHA   Step down 4in 15 1 HHA   Tandem stance 2x 30".   Tandem stance on foam with intermittent HHA  PATIENT EDUCATION:  Education details: Patient educated on HEP, Animator Person educated: Patient Education method: Explanation, Demonstration, and Handouts Education comprehension: verbalized understanding and returned demonstration     HOME EXERCISE PROGRAM: Access Code: [email or web address] URL: [email or web address] Date: 07/26/2021 Prepared by: [facility] - Rehab  Exercises - Supine Single Knee to Chest Stretch  -  1 x daily - 7 x weekly - 1 sets - 10 reps - Prone Hip Extension  - 1 x daily - 7 x weekly - 1 sets - 10 reps - Prone Press Up  - 1 x daily - 7 x weekly - 1 sets - 10 reps   06/28/21 Bridge with marching, step ups   Quad set, glute set, TRA activation, hip abduction/adduction iso 3/29 heel slides, bent knee fall outs, quadruped rocking, prone heel squeeze    Exercises - Supine Bridge  - 1 x daily - 7 x weekly - 2 sets - 10 reps - Clamshell  - 1 x daily - 7 x weekly - 2 sets - 10 reps ASSESSMENT:   CLINICAL IMPRESSION: Patient s/p 9 weeks  07/25/21.  Progress note today. Patient with some noted continued limitations with hip mobility and strength so added some gentle stretching; also added hip vectors to increase lower extremity strength.  Patient will benefit from continued skilled therapy interventions to address deficits and improve functional mobility     OBJECTIVE IMPAIRMENTS Abnormal gait, decreased activity tolerance, decreased balance, decreased knowledge of use of DME, difficulty walking, decreased ROM, decreased strength, impaired perceived functional ability, increased muscle spasms, impaired flexibility, improper body mechanics, and pain.    ACTIVITY LIMITATIONS cleaning, community activity, driving, meal prep, occupation, laundry, yard work, shopping, and school.    PERSONAL FACTORS Behavior pattern, Past/current experiences, and Time since onset of injury/illness/exacerbation are also affecting patient's functional outcome.      REHAB POTENTIAL: Good   CLINICAL DECISION MAKING: Stable/uncomplicated   EVALUATION COMPLEXITY: Low   GOALS: Goals reviewed with patient? Yes   SHORT TERM GOALS: Target date: 07/12/2021   Patient will be independent with HEP in order to improve functional outcomes. Baseline:  Goal status: met 2.  Patient will report at least 25% improvement in symptoms for improved quality of life. Baseline:  Goal status: Ongoing   3.  Patient will  ambulate with full weight bearing without pain with normalized mechanics for progression to next phase of protocol.  Baseline:  Goal status: partially met; she is ambulating without AD full weight bearing but has some continued pain and soreness.      LONG TERM GOALS: Target date: 08/09/2021   Patient will report at least 75% improvement in symptoms for improved quality of life. Baseline:  Goal status: Ongoing   2.  Patient will improve FOTO score by at least 20 points in order to indicate improved tolerance to activity. Baseline: complete next session Goal status: Ongoing   3.  Patient will be able to navigate stairs with reciprocal pattern without compensation in order to demonstrate improved LE strength. Baseline:  Goal status: Ongoing   4.  Patient will demonstrate at least 4-/5 hip flexor MMT and hip abduction,extension, IR/ER of 4/5 per protocol to demonstrate improved strength for ADL.  Baseline:  Goal status: Ongoing         PLAN: PT FREQUENCY: 2x/week   PT DURATION: 8 weeks   PLANNED INTERVENTIONS: Therapeutic exercises, Therapeutic activity, Neuromuscular re-education, Balance training, Gait training, Patient/Family education, Joint manipulation, Joint mobilization, Stair training, Orthotic/Fit training, DME instructions,  Aquatic Therapy, Dry Needling, Electrical stimulation, Spinal manipulation, Spinal mobilization, Cryotherapy, Moist heat, Compression bandaging, scar mobilization, Splintting, Taping, Traction, Ultrasound, Ionotophoresis 4mg /ml Dexamethasone, and Manual therapy     PLAN FOR NEXT SESSION: progress towards full AROM, pain free normalized gait, hip flexor strength at 4-/5 and 4/5 for abd, add, extension and IR/ER strength to  progress to phase 3 week 9-12.  12:00 PM, 07/26/21 Edan Juday Small Adalis Gatti MPT [place] [facility] [place] [phone] Ph:[phone]

## 2021-07-31 ENCOUNTER — Ambulatory Visit (HOSPITAL_COMMUNITY): Payer: 59

## 2021-07-31 ENCOUNTER — Encounter (HOSPITAL_COMMUNITY): Payer: Self-pay

## 2021-07-31 DIAGNOSIS — M25561 Pain in right knee: Secondary | ICD-10-CM

## 2021-07-31 DIAGNOSIS — R2689 Other abnormalities of gait and mobility: Secondary | ICD-10-CM | POA: Diagnosis not present

## 2021-07-31 DIAGNOSIS — M25572 Pain in left ankle and joints of left foot: Secondary | ICD-10-CM | POA: Diagnosis not present

## 2021-07-31 DIAGNOSIS — M6281 Muscle weakness (generalized): Secondary | ICD-10-CM | POA: Diagnosis not present

## 2021-07-31 DIAGNOSIS — R29898 Other symptoms and signs involving the musculoskeletal system: Secondary | ICD-10-CM | POA: Diagnosis not present

## 2021-07-31 DIAGNOSIS — M25551 Pain in right hip: Secondary | ICD-10-CM

## 2021-07-31 NOTE — Therapy (Signed)
?OUTPATIENT PHYSICAL THERAPY TREATMENT NOTE ? ? ? ? ? ?END OF SESSION:  ? PT End of Session - 07/31/21 1111   ? ? Visit Number 12   ? Number of Visits 16   ? Date for PT Re-Evaluation 08/09/21   ? Authorization Type [place] UMR No auth, no VL, med necessity after 25th visit)   ? Authorization - Visit Number 11   ? Authorization - Number of Visits 25   ? PT Start Time 1057   late arrival  ? PT Stop Time 1127   ? PT Time Calculation (min) 30 min   ? Activity Tolerance Patient tolerated treatment well   ? Behavior During Therapy [facility] for tasks assessed/performed   ? ?  ?  ? ?  ? ? ? ?Patient Name: Virginia Hawkins ?MRN: [id number] ?DOB:January 21, 1971, 51 y.o., female ?Today's Date: 07/31/2021 ? ?PCP: Noralee Stain, MD ?REFERRING PROVIDER: Rosalio Macadamia, MD ? ?Next Apt scheduled with MD Bobby Rumpf: May 26th ? ? ?Past Medical History:  ?Diagnosis Date  ? Fibromyalgia   ? Hypertension   ? Ovarian cyst   ? Vaginal Pap smear, abnormal   ? ?Past Surgical History:  ?Procedure Laterality Date  ? CYST EXCISION    ? FOOT SURGERY Left 2020  ? for plantar fasciitis  ? SALPINGECTOMY    ? ?Patient Active Problem List  ? Diagnosis Date Noted  ? Amenorrhea 05/01/2020  ? Plantar fasciitis 09/01/2017  ? History of ovarian cyst 03/23/2015  ? History of abnormal cervical Pap smear 03/23/2015  ? Fibromyalgia 03/23/2015  ? Hypertension, essential 10/05/2014  ? Vitiligo 10/05/2014  ? ? ?REFERRING DIAG: Z98.890 s/p arthroscopy of hip ? ?THERAPY DIAG:  ?Pain in right hip ? ?Acute pain of right knee ? ?Other symptoms and signs involving the musculoskeletal system ? ?Other abnormalities of gait and mobility ? ?PERTINENT HISTORY: DOS 05/23/21; Fall Sept. 2022 ? ?PRECAUTIONS: PRECAUTIONS: Other: Arthroscopic hip labral repair with femoral osteoplasty - see protocol for guideline and precautions in media ?  ?WEIGHT BEARING RESTRICTIONS Yes Week 1-4 50% flat foot weight bearing, WBAT >4 weeks ? ?SUBJECTIVE: Patient reports she is still  sore today but slightly less; knees are a little bit better today.  Reports pain crossing Lt foot over, difficult to lay on Rt side and to bendover to tie shoes.   ? ?PAIN:  ?Are you having pain? Yes: NPRS scale: 6/10 ?Pain location: R anterior hip ?Pain description: tender soreness ?Aggravating factors: movement, flexing hip ?Relieving factors: rest ? ? ?  ?PATIENT GOALS to walk better and full mobility ?  ?  ?OBJECTIVE:  ?  ?  ?  ?PATIENT SURVEYS:  ?FOTO done 06/26/21 score 51 ?  ? ?  ?LE ROM: ?  ?Passive ROM Right ?06/14/2021 Left ?06/14/2021 Right  ?07/05/21 Right ?07/26/21  ?Hip flexion 90   93 110 (with knee flexed)  ?Hip extension Lacking 7   0   ?Hip abduction        ?Hip adduction        ?Hip internal rotation      38  ?Hip external rotation      24  ?Knee flexion     93 125  ?Knee extension     0   ?Ankle dorsiflexion        ?Ankle plantarflexion        ?Ankle inversion        ?Ankle eversion        ? (Blank rows =  not tested) ?  ?LE MMT: ?  ?MMT Right ?06/14/2021 Left ?06/14/2021 Right ?07/09/21 Left  ?07/09/21 Right ?07/26/21  ?Hip flexion   5/5 4/5 5/5   ?Hip extension       3-  ?Hip abduction         ?Hip adduction       3-  ?Hip internal rotation         ?Hip external rotation         ?Knee flexion 5/5 5/5     ?Knee extension 4+/5 5/5 4+/5    ?Ankle dorsiflexion 5/5 5/5     ?Ankle plantarflexion         ?Ankle inversion         ?Ankle eversion         ? (Blank rows = not tested) ?  ?  ?  ?FUNCTIONAL TESTS:  ?Transfers: labored, unsteady ?  ?GAIT: ?Distance walked: 100 ?Assistive device utilized: cane ?Level of assistance: Modified independence ?Comments: trunk flexed slightly ?  ?  ?  ?TODAY'S TREATMENT: ?07/31/21: ?Upright bike 5' ? ?Standing: forward then sidestep 6 and 12in hurdles ? Vector stance 5x 5" on foam intermittent HHA ? Squat front of chair ? Tandem stance on foam 2 x 30" each 2nd set with head turns. ? 3D hip excursion ? ?Quadruped: firehydrant ? Knee to chest then extend hip ?Prone hip extension 2x  10 ?  ?  ?07/26/21 ?Upright bike 5' ?- Supine Single Knee to Chest Stretch  - 1 x daily - 7 x weekly - 1 sets - 10 reps ?- Prone Hip Extension  - 1 x daily - 7 x weekly - 1 sets - 10 reps ?- Prone Press Up  - 1 x daily - 7 x weekly - 1 sets - 10 reps ? ?Sit stand to up on toes 2 x 10 ?Tandem stance on foam 2 x 1' each ?Hip vectors x 10 each dide ? ?07/24/21 ?Upright bike x 5' ?Squat 2 x 10 ?Heel raises x 20 no UE ?12" step up 2 x 10 ?Side stepping with GTB x 20 ft x 4 passes ?Plank x 20" x 3 ?Side plank 20" x 2 each ? ? ? ?07/18/21: ?Elliptical x 3 L1 ?Squat to heel raise no HHA 3" holds each 10x ?7in stairs reciprocal pattern 5RT ?Lateral step ups 6in  ?Valgus isometric abd foot on 12in step RTB resistance 10x 5" ?Vector Stance 5x 5" ?Tandem stance on foam 1x 30" ?Tandem stance on foam with head turns 10x 2 sets ? ?Leg press 3Pl 2x 10 controlled movements ?[employer] walkout 5RT 3PL; 2PL with sidestep ? ?07/12/21 ?Standing heel raises 15 x 5" ?Slant board 5 x 30" ?Step up 12 in step 2' with no HHA  ?Lateral step ups 12 inch 1' no HHA (painful R knee) ?Tandem stance 2 x 30"on foam with intermittent HHA ?Functional Squats 2 x 10  ?Upright bike x 6 min no resistance.  ?SLS 3 x 30" each ?Hip vector Right x 8 ? ? ?07/09/21 ?Upright bike no resistance x 5" ?Standing  ?Heel raises 15 x 5" ?Slant board 5 x 30" ? ?Step up 12 in step 2' with 1 HHA ? ?Lateral step ups 8 inch 2'  ? ?Tandem stance 2x 30". ?Tandem stance on foam with intermittent HHA ? ?Gait train 1 lap in gym with focus on form; arm swing.  ? ?Functional Squats 2 x 10  ? ? ? ? ? ? ? ?07/05/21: ?  Upright bike no resistance 4' ? Standing:  ?  Heel raises 15x 5" ?  Squat front of chair 2x 10 cueing for mechanics ?  Step up 6in step 2x 15 with 1 HHA ?  Step down 4in 15 1 HHA ?  Tandem stance 2x 30". ?  Tandem stance on foam with intermittent HHA ?   ? ? ?  ?  ?PATIENT EDUCATION:  ?Education details: Patient educated on HEP, gait mechanics ?Person educated:  Patient ?Education method: Explanation, Demonstration, and Handouts ?Education comprehension: verbalized understanding and returned demonstration ?  ?  ?HOME EXERCISE PROGRAM: ?Access Code: [email or web address] ?URL: [email or web address] ?Date: 07/26/2021 ?Prepared by: [facility] - Rehab ? ?Exercises ?- Supine Single Knee to Chest Stretch  - 1 x daily - 7 x weekly - 1 sets - 10 reps ?- Prone Hip Extension  - 1 x daily - 7 x weekly - 1 sets - 10 reps ?- Prone Press Up  - 1 x daily - 7 x weekly - 1 sets - 10 reps ? ? ?06/28/21 ?Bridge with marching, step ups  ? ?Quad set, glute set, TRA activation, hip abduction/adduction iso 3/29 heel slides, bent knee fall outs, quadruped rocking, prone heel squeeze ? ? ? ?Exercises ?- Supine Bridge  - 1 x daily - 7 x weekly - 2 sets - 10 reps ?- Clamshell  - 1 x daily - 7 x weekly - 2 sets - 10 reps ?ASSESSMENT: ?  ?CLINICAL IMPRESSION: ?Continued session focus with hip mobility and strengthening with phase II due to impaired hip mobility and still limited by pain.  Added exercises for mobility that was tolerated well, does report a pull/stretch in groin, monitored for pain vs stretch.  Added hurdles to improve hip flexion mobility and balance with good control.  Quadruped for core and gluteal stability.  Pt tolerated well with no reports of increased pain, did c/o knee popping during weight shifting. ? ? ?  ?OBJECTIVE IMPAIRMENTS Abnormal gait, decreased activity tolerance, decreased balance, decreased knowledge of use of DME, difficulty walking, decreased ROM, decreased strength, impaired perceived functional ability, increased muscle spasms, impaired flexibility, improper body mechanics, and pain.  ?  ?ACTIVITY LIMITATIONS cleaning, community activity, driving, meal prep, occupation, laundry, yard work, shopping, and school.  ?  ?PERSONAL FACTORS Behavior pattern, Past/current experiences, and Time since onset of injury/illness/exacerbation are also affecting patient's functional  outcome.  ?  ?  ?REHAB POTENTIAL: Good ?  ?CLINICAL DECISION MAKING: Stable/uncomplicated ?  ?EVALUATION COMPLEXITY: Low ?  ?GOALS: ?Goals reviewed with patient? Yes ?  ?SHORT TERM GOALS: Target date: 07/12/2021 ?  ?Patient wil

## 2021-08-02 ENCOUNTER — Ambulatory Visit (HOSPITAL_COMMUNITY): Payer: 59 | Admitting: Physical Therapy

## 2021-08-02 ENCOUNTER — Encounter (HOSPITAL_COMMUNITY): Payer: Self-pay | Admitting: Physical Therapy

## 2021-08-02 DIAGNOSIS — M6281 Muscle weakness (generalized): Secondary | ICD-10-CM | POA: Diagnosis not present

## 2021-08-02 DIAGNOSIS — R2689 Other abnormalities of gait and mobility: Secondary | ICD-10-CM

## 2021-08-02 DIAGNOSIS — M25561 Pain in right knee: Secondary | ICD-10-CM | POA: Diagnosis not present

## 2021-08-02 DIAGNOSIS — M25551 Pain in right hip: Secondary | ICD-10-CM | POA: Diagnosis not present

## 2021-08-02 DIAGNOSIS — R29898 Other symptoms and signs involving the musculoskeletal system: Secondary | ICD-10-CM

## 2021-08-02 DIAGNOSIS — M25572 Pain in left ankle and joints of left foot: Secondary | ICD-10-CM | POA: Diagnosis not present

## 2021-08-02 NOTE — Therapy (Signed)
?OUTPATIENT PHYSICAL THERAPY TREATMENT NOTE ? ? ? ? ? ?END OF SESSION:  ? PT End of Session - 08/02/21 1002   ? ? Visit Number 13   ? Number of Visits 16   ? Date for PT Re-Evaluation 08/09/21   ? Authorization Type [place] [employer] No auth, no VL, med necessity after 25th visit)   ? Authorization - Visit Number 12   ? Authorization - Number of Visits 25   ? PT Start Time 1003   ? PT Stop Time 1041   ? PT Time Calculation (min) 38 min   ? Activity Tolerance Patient tolerated treatment well   ? Behavior During Therapy [facility] for tasks assessed/performed   ? ?  ?  ? ?  ? ? ? ?Patient Name: Virginia Hawkins ?MRN: [id number] ?DOB:09/13/1970, 51 y.o., female ?Today's Date: 08/02/2021 ? ?PCP: Noralee Stain, MD ?REFERRING PROVIDER: Rosalio Macadamia, MD ? ?Next Apt scheduled with MD Bobby Rumpf: May 26th ? ? ?Past Medical History:  ?Diagnosis Date  ? Fibromyalgia   ? Hypertension   ? Ovarian cyst   ? Vaginal Pap smear, abnormal   ? ?Past Surgical History:  ?Procedure Laterality Date  ? CYST EXCISION    ? FOOT SURGERY Left 2020  ? for plantar fasciitis  ? SALPINGECTOMY    ? ?Patient Active Problem List  ? Diagnosis Date Noted  ? Amenorrhea 05/01/2020  ? Plantar fasciitis 09/01/2017  ? History of ovarian cyst 03/23/2015  ? History of abnormal cervical Pap smear 03/23/2015  ? Fibromyalgia 03/23/2015  ? Hypertension, essential 10/05/2014  ? Vitiligo 10/05/2014  ? ? ?REFERRING DIAG: Z98.890 s/p arthroscopy of hip ? ?THERAPY DIAG:  ?Pain in right hip ? ?Acute pain of right knee ? ?Other symptoms and signs involving the musculoskeletal system ? ?Other abnormalities of gait and mobility ? ?PERTINENT HISTORY: DOS 05/23/21; Fall Sept. 2022 ? ?PRECAUTIONS: PRECAUTIONS: Other: Arthroscopic hip labral repair with femoral osteoplasty - see protocol for guideline and precautions in media ?  ?WEIGHT BEARING RESTRICTIONS Yes Week 1-4 50% flat foot weight bearing, WBAT >4 weeks ? ?SUBJECTIVE: Patient states intermittent groin and hip  pain. Has good days and bad. Home exercises are going well. Going back to work June 1st but not until the following week.  ? ?PAIN:  ?Are you having pain? Yes: NPRS scale: 6/10 ?Pain location: R anterior hip ?Pain description: tender soreness ?Aggravating factors: movement, flexing hip ?Relieving factors: rest ? ? ?  ?PATIENT GOALS to walk better and full mobility ?  ?  ?OBJECTIVE:  ?  ?  ?  ?PATIENT SURVEYS:  ?FOTO done 06/26/21 score 51 ?  ? ?  ?LE ROM: ?  ?Passive ROM Right ?06/14/2021 Left ?06/14/2021 Right  ?07/05/21 Right ?07/26/21  ?Hip flexion 90   93 110 (with knee flexed)  ?Hip extension Lacking 7   0   ?Hip abduction        ?Hip adduction        ?Hip internal rotation      38  ?Hip external rotation      24  ?Knee flexion     93 125  ?Knee extension     0   ?Ankle dorsiflexion        ?Ankle plantarflexion        ?Ankle inversion        ?Ankle eversion        ? (Blank rows = not tested) ?  ?LE MMT: ?  ?MMT Right ?  06/14/2021 Left ?06/14/2021 Right ?07/09/21 Left  ?07/09/21 Right ?07/26/21  ?Hip flexion   5/5 4/5 5/5   ?Hip extension       3-  ?Hip abduction         ?Hip adduction       3-  ?Hip internal rotation         ?Hip external rotation         ?Knee flexion 5/5 5/5     ?Knee extension 4+/5 5/5 4+/5    ?Ankle dorsiflexion 5/5 5/5     ?Ankle plantarflexion         ?Ankle inversion         ?Ankle eversion         ? (Blank rows = not tested) ?  ?  ?  ?FUNCTIONAL TESTS:  ?Transfers: labored, unsteady ?  ?GAIT: ?Distance walked: 100 ?Assistive device utilized: cane ?Level of assistance: Modified independence ?Comments: trunk flexed slightly ?  ?  ?  ?TODAY'S TREATMENT: ?08/02/21 ?Elliptical 5 minute for dynamic warm up ?Hip slide outs lateral and retro with mini squat 2x 5 bilateral  ?Alternating hip extension in prone 2 x 10 bilateral  ?Sidelying hip abduction 2 x 15 RLE ?SLR 2x 10 RLE ?Standing hip flexor stretch at step 5x 20 second holds RLE  ?Hip hinge with dowel 3x 10  ? ? ? ?07/31/21: ?Upright bike 5' ? ?Standing:  forward then sidestep 6 and 12in hurdles ? Vector stance 5x 5" on foam intermittent HHA ? Squat front of chair ? Tandem stance on foam 2 x 30" each 2nd set with head turns. ? 3D hip excursion ? ?Quadruped: firehydrant ? Knee to chest then extend hip ?Prone hip extension 2x 10 ?  ?  ?07/26/21 ?Upright bike 5' ?- Supine Single Knee to Chest Stretch  - 1 x daily - 7 x weekly - 1 sets - 10 reps ?- Prone Hip Extension  - 1 x daily - 7 x weekly - 1 sets - 10 reps ?- Prone Press Up  - 1 x daily - 7 x weekly - 1 sets - 10 reps ? ?Sit stand to up on toes 2 x 10 ?Tandem stance on foam 2 x 1' each ?Hip vectors x 10 each dide ? ?07/24/21 ?Upright bike x 5' ?Squat 2 x 10 ?Heel raises x 20 no UE ?12" step up 2 x 10 ?Side stepping with GTB x 20 ft x 4 passes ?Plank x 20" x 3 ?Side plank 20" x 2 each ? ? ?  ?  ?PATIENT EDUCATION:  ?Education details: Patient educated on HEP, gait mechanics 5/11 continued HEP ?Person educated: Patient ?Education method: Explanation, Demonstration, and Handouts ?Education comprehension: verbalized understanding and returned demonstration ?  ?  ?HOME EXERCISE PROGRAM: ?Access Code: [email or web address] ?URL: [email or web address] ?Date: 07/26/2021 ?Prepared by: [facility] ? ?Exercises ?- Supine Single Knee to Chest Stretch  - 1 x daily - 7 x weekly - 1 sets - 10 reps ?- Prone Hip Extension  - 1 x daily - 7 x weekly - 1 sets - 10 reps ?- Prone Press Up  - 1 x daily - 7 x weekly - 1 sets - 10 reps ? ? ?06/28/21 ?Bridge with marching, step ups  ? ?Quad set, glute set, TRA activation, hip abduction/adduction iso 3/29 heel slides, bent knee fall outs, quadruped rocking, prone heel squeeze ? ? ?Exercises ?- Supine Bridge  - 1 x daily - 7 x weekly - 2 sets - 10 reps ?-  Clamshell  - 1 x daily - 7 x weekly - 2 sets - 10 reps ? ?Access Code: [email or web address] ?- 3-Way Lunge on Slider  - 1 x daily - 7 x weekly - 3 sets - 5 reps ?- Active Straight Leg Raise with Quad Set  - 1 x daily - 7 x weekly - 3 sets - 10  reps ? ?ASSESSMENT: ?  ?CLINICAL IMPRESSION: ?Began session with elliptical for dynamic warm up and conditioning. Ongoing R knee pain likely due to R dynamic LE valgus with impaired glute strength. Good mechanics with slide outs laterally and posterior but avoided anterior as patient with c/o knee pain with lateral step down due to required anterior translation and impaired motor control.  Fatigue with table hip strengthening exercises and slight extension lag with SLR. Uses dowel for postural cueing and hip hinge mechanics with intermittent cueing required for hip flexion vs knee flexion and glute activation with return to standing. Patient will continue to benefit from physical therapy in order to improve function and reduce impairment. ? ? ? ?  ?OBJECTIVE IMPAIRMENTS Abnormal gait, decreased activity tolerance, decreased balance, decreased knowledge of use of DME, difficulty walking, decreased ROM, decreased strength, impaired perceived functional ability, increased muscle spasms, impaired flexibility, improper body mechanics, and pain.  ?  ?ACTIVITY LIMITATIONS cleaning, community activity, driving, meal prep, occupation, laundry, yard work, shopping, and school.  ?  ?PERSONAL FACTORS Behavior pattern, Past/current experiences, and Time since onset of injury/illness/exacerbation are also affecting patient's functional outcome.  ?  ?  ?REHAB POTENTIAL: Good ?  ?CLINICAL DECISION MAKING: Stable/uncomplicated ?  ?EVALUATION COMPLEXITY: Low ?  ?GOALS: ?Goals reviewed with patient? Yes ?  ?SHORT TERM GOALS: Target date: 07/12/2021 ?  ?Patient will be independent with HEP in order to improve functional outcomes. ?Baseline:  ?Goal status: met ?2.  Patient will report at least 25% improvement in symptoms for improved quality of life. ?Baseline:  ?Goal status: Ongoing ?  ?3.  Patient will ambulate with full weight bearing without pain with normalized mechanics for progression to next phase of protocol.  ?Baseline:  ?Goal  status: partially met; she is ambulating without AD full weight bearing but has some continued pain and soreness.  ?  ?  ?LONG TERM GOALS: Target date: 08/09/2021 ?  ?Patient will report at least 75% improvement in symp

## 2021-08-07 ENCOUNTER — Ambulatory Visit (HOSPITAL_COMMUNITY): Payer: 59

## 2021-08-07 DIAGNOSIS — R29898 Other symptoms and signs involving the musculoskeletal system: Secondary | ICD-10-CM | POA: Diagnosis not present

## 2021-08-07 DIAGNOSIS — M25551 Pain in right hip: Secondary | ICD-10-CM

## 2021-08-07 DIAGNOSIS — M6281 Muscle weakness (generalized): Secondary | ICD-10-CM | POA: Diagnosis not present

## 2021-08-07 DIAGNOSIS — M25561 Pain in right knee: Secondary | ICD-10-CM | POA: Diagnosis not present

## 2021-08-07 DIAGNOSIS — R2689 Other abnormalities of gait and mobility: Secondary | ICD-10-CM

## 2021-08-07 DIAGNOSIS — M25572 Pain in left ankle and joints of left foot: Secondary | ICD-10-CM | POA: Diagnosis not present

## 2021-08-07 NOTE — Therapy (Signed)
OUTPATIENT PHYSICAL THERAPY TREATMENT NOTE      END OF SESSION:   PT End of Session - 08/07/21 1118     Visit Number 14    Number of Visits 16    Date for PT Re-Evaluation 08/09/21    Authorization Type [place] [employer] No auth, no VL, med necessity after 25th visit)    Authorization - Visit Number 12    Authorization - Number of Visits 25    PT Start Time 1040    PT Stop Time 1119    PT Time Calculation (min) 39 min    Activity Tolerance Patient tolerated treatment well    Behavior During Therapy [facility] for tasks assessed/performed               Patient Name: Virginia Hawkins MRN: [id number] DOB:[birth year], 51 y.o., female Today's Date: 08/07/2021  PCP: Danae Chen, MD REFERRING PROVIDER: Lily Kocher, MD  Next Apt scheduled with MD Melvyn Neth: May 26th   Past Medical History:  Diagnosis Date   Fibromyalgia    Hypertension    Ovarian cyst    Vaginal Pap smear, abnormal    Past Surgical History:  Procedure Laterality Date   CYST EXCISION     FOOT SURGERY Left 2020   for plantar fasciitis   SALPINGECTOMY     Patient Active Problem List   Diagnosis Date Noted   Amenorrhea 05/01/2020   Plantar fasciitis 09/01/2017   History of ovarian cyst 03/23/2015   History of abnormal cervical Pap smear 03/23/2015   Fibromyalgia 03/23/2015   Hypertension, essential 10/05/2014   Vitiligo 10/05/2014    REFERRING DIAG: M57.846 s/p arthroscopy of hip  THERAPY DIAG:  Pain in right hip  Other abnormalities of gait and mobility  Acute pain of right knee  Pain in left ankle and joints of left foot  Other symptoms and signs involving the musculoskeletal system  Muscle weakness (generalized)  PERTINENT HISTORY: DOS 05/23/21; Fall Sept. 2022  PRECAUTIONS: PRECAUTIONS: Other: Arthroscopic hip labral repair with femoral osteoplasty - see protocol for guideline and precautions in media   WEIGHT BEARING RESTRICTIONS Yes Week 1-4 50% flat foot weight  bearing, WBAT >4 weeks  SUBJECTIVE: Patient reports hip soreness continues 7/10; no knee pain today.  Returns to work likely 08/26/21?  PAIN:  Are you having pain? Yes: NPRS scale: 7/10 Pain location: R anterior hip Pain description: tender soreness Aggravating factors: movement, flexing hip Relieving factors: rest; no knee pain today     PATIENT GOALS to walk better and full mobility     OBJECTIVE:        PATIENT SURVEYS:  FOTO done 06/26/21 score 51      LE ROM:   Passive ROM Right 06/14/2021 Left 06/14/2021 Right  07/05/21 Right 07/26/21  Hip flexion 90   93 110 (with knee flexed)  Hip extension Lacking 7   0   Hip abduction        Hip adduction        Hip internal rotation      38  Hip external rotation      24  Knee flexion     93 125  Knee extension     0   Ankle dorsiflexion        Ankle plantarflexion        Ankle inversion        Ankle eversion         (Blank rows = not tested)   LE  MMT:   MMT Right 06/14/2021 Left 06/14/2021 Right 07/09/21 Left  07/09/21 Right 07/26/21  Hip flexion   5/5 4/5 5/5   Hip extension       3-  Hip abduction         Hip adduction       3-  Hip internal rotation         Hip external rotation         Knee flexion 5/5 5/5     Knee extension 4+/5 5/5 4+/5    Ankle dorsiflexion 5/5 5/5     Ankle plantarflexion         Ankle inversion         Ankle eversion          (Blank rows = not tested)       FUNCTIONAL TESTS:  Transfers: labored, unsteady   GAIT: Distance walked: 100 Assistive device utilized: cane Level of assistance: Modified independence Comments: trunk flexed slightly       TODAY'S TREATMENT: 08/07/21 Upright bike 5 minute for dynamic warm up Hip slide outs lateral and retro with mini squat 2x 5 bilateral  Alternating hip extension in prone 2 x 10 bilateral  Sidelying hip abduction 2 x 10 RLE Alternating hip extension in prone 2 x 10 bilateral  Hip hinge with dowel 3x 10   hip hike 2 x 10 each side  SLR  2 x 10   08/02/21 Elliptical 5 minute for dynamic warm up Hip slide outs lateral and retro with mini squat 2x 5 bilateral  Alternating hip extension in prone 2 x 10 bilateral  Sidelying hip abduction 2 x 15 RLE SLR 2x 10 RLE Standing hip flexor stretch at step 5x 20 second holds RLE  Hip hinge with dowel 3x 10     07/31/21: Upright bike 5'  Standing: forward then sidestep 6 and 12in hurdles  Vector stance 5x 5" on foam intermittent HHA  Squat front of chair  Tandem stance on foam 2 x 30" each 2nd set with head turns.  3D hip excursion  Quadruped: firehydrant  Knee to chest then extend hip Prone hip extension 2x 10         PATIENT EDUCATION:  Education details: Patient educated on HEP, gait mechanics 5/11 continued HEP Person educated: Patient Education method: Explanation, Demonstration, and Handouts Education comprehension: verbalized understanding and returned demonstration     HOME EXERCISE PROGRAM: Access Code: [email or web address] URL: [email or web address] Date: 07/26/2021 Prepared by: [facility] - Rehab  Exercises - Supine Single Knee to Chest Stretch  - 1 x daily - 7 x weekly - 1 sets - 10 reps - Prone Hip Extension  - 1 x daily - 7 x weekly - 1 sets - 10 reps - Prone Press Up  - 1 x daily - 7 x weekly - 1 sets - 10 reps   06/28/21 Bridge with marching, step ups   Quad set, glute set, TRA activation, hip abduction/adduction iso 3/29 heel slides, bent knee fall outs, quadruped rocking, prone heel squeeze   Exercises - Supine Bridge  - 1 x daily - 7 x weekly - 2 sets - 10 reps - Clamshell  - 1 x daily - 7 x weekly - 2 sets - 10 reps  Access Code: [email or web address] - 3-Way Lunge on Slider  - 1 x daily - 7 x weekly - 3 sets - 5 reps - Active Straight Leg Raise with Quad Set  - 1 x daily - 7 x  weekly - 3 sets - 10 reps  ASSESSMENT:   CLINICAL IMPRESSION: Today's session continued to focus on hip strengthening and mobility.  Added hip hikes to treatment today to  improve hip control with ambulation; patient with no report of knee pain during treatment today. Continues with mild lag with SLR but overall progressing well with therapy.  Patient will continue to benefit from physical therapy in order to improve function and reduce impairment.      OBJECTIVE IMPAIRMENTS Abnormal gait, decreased activity tolerance, decreased balance, decreased knowledge of use of DME, difficulty walking, decreased ROM, decreased strength, impaired perceived functional ability, increased muscle spasms, impaired flexibility, improper body mechanics, and pain.    ACTIVITY LIMITATIONS cleaning, community activity, driving, meal prep, occupation, laundry, yard work, shopping, and school.    PERSONAL FACTORS Behavior pattern, Past/current experiences, and Time since onset of injury/illness/exacerbation are also affecting patient's functional outcome.      REHAB POTENTIAL: Good   CLINICAL DECISION MAKING: Stable/uncomplicated   EVALUATION COMPLEXITY: Low   GOALS: Goals reviewed with patient? Yes   SHORT TERM GOALS: Target date: 07/12/2021   Patient will be independent with HEP in order to improve functional outcomes. Baseline:  Goal status: met 2.  Patient will report at least 25% improvement in symptoms for improved quality of life. Baseline:  Goal status: Ongoing   3.  Patient will ambulate with full weight bearing without pain with normalized mechanics for progression to next phase of protocol.  Baseline:  Goal status: partially met; she is ambulating without AD full weight bearing but has some continued pain and soreness.      LONG TERM GOALS: Target date: 08/09/2021   Patient will report at least 75% improvement in symptoms for improved quality of life. Baseline:  Goal status: Ongoing   2.  Patient will improve FOTO score by at least 20 points in order to indicate improved tolerance to activity. Baseline: complete next session Goal status: Ongoing   3.   Patient will be able to navigate stairs with reciprocal pattern without compensation in order to demonstrate improved LE strength. Baseline:  Goal status: Ongoing   4.  Patient will demonstrate at least 4-/5 hip flexor MMT and hip abduction,extension, IR/ER of 4/5 per protocol to demonstrate improved strength for ADL.  Baseline:  Goal status: Ongoing         PLAN: PT FREQUENCY: 2x/week   PT DURATION: 8 weeks   PLANNED INTERVENTIONS: Therapeutic exercises, Therapeutic activity, Neuromuscular re-education, Balance training, Gait training, Patient/Family education, Joint manipulation, Joint mobilization, Stair training, Orthotic/Fit training, DME instructions, Aquatic Therapy, Dry Needling, Electrical stimulation, Spinal manipulation, Spinal mobilization, Cryotherapy, Moist heat, Compression bandaging, scar mobilization, Splintting, Taping, Traction, Ultrasound, Ionotophoresis 4mg /ml Dexamethasone, and Manual therapy     PLAN FOR NEXT SESSION: progress note next treatment; patient scheduled to return to work 08/23/21  11:21 AM, 08/07/21 Josely Moffat Small Jamerica Snavely MPT [facility] [place] [phone] Ph:[phone]

## 2021-08-08 ENCOUNTER — Other Ambulatory Visit (HOSPITAL_COMMUNITY): Payer: Self-pay

## 2021-08-08 DIAGNOSIS — I10 Essential (primary) hypertension: Secondary | ICD-10-CM | POA: Diagnosis not present

## 2021-08-08 MED ORDER — LOSARTAN POTASSIUM 100 MG PO TABS
100.0000 mg | ORAL_TABLET | Freq: Every day | ORAL | 11 refills | Status: DC
  Filled 2021-08-08: qty 30, 30d supply, fill #0
  Filled 2021-09-22: qty 30, 30d supply, fill #1
  Filled 2021-09-24: qty 90, 90d supply, fill #1
  Filled 2021-12-23: qty 90, 90d supply, fill #2
  Filled 2022-03-26: qty 90, 90d supply, fill #3
  Filled 2022-06-22: qty 60, 60d supply, fill #4

## 2021-08-09 ENCOUNTER — Encounter (HOSPITAL_COMMUNITY): Payer: 59 | Admitting: Physical Therapy

## 2021-08-29 ENCOUNTER — Other Ambulatory Visit (HOSPITAL_COMMUNITY): Payer: Self-pay

## 2021-09-24 ENCOUNTER — Other Ambulatory Visit (HOSPITAL_COMMUNITY): Payer: Self-pay

## 2021-09-27 ENCOUNTER — Ambulatory Visit (HOSPITAL_COMMUNITY): Payer: 59 | Attending: Orthopedic Surgery

## 2021-09-27 DIAGNOSIS — M25551 Pain in right hip: Secondary | ICD-10-CM | POA: Diagnosis not present

## 2021-09-27 DIAGNOSIS — R2689 Other abnormalities of gait and mobility: Secondary | ICD-10-CM | POA: Diagnosis not present

## 2021-09-27 NOTE — Therapy (Signed)
OUTPATIENT PHYSICAL THERAPY LOWER EXTREMITY EVALUATION   Patient Name: Virginia Hawkins MRN: [id number] DOB:1971/02/21, 51 y.o., female Today's Date: 09/27/2021   PT End of Session - 09/27/21 1708     Visit Number 1    Number of Visits 8    Date for PT Re-Evaluation 10/25/21    Authorization Type [employer] No auth, no VL, med necessity after 25th visit)    PT Start Time 1607    PT Stop Time 1645    PT Time Calculation (min) 38 min             Past Medical History:  Diagnosis Date   Fibromyalgia    Hypertension    Ovarian cyst    Vaginal Pap smear, abnormal    Past Surgical History:  Procedure Laterality Date   CYST EXCISION     FOOT SURGERY Left 2020   for plantar fasciitis   SALPINGECTOMY     Patient Active Problem List   Diagnosis Date Noted   Amenorrhea 05/01/2020   Plantar fasciitis 09/01/2017   History of ovarian cyst 03/23/2015   History of abnormal cervical Pap smear 03/23/2015   Fibromyalgia 03/23/2015   Hypertension, essential 10/05/2014   Vitiligo 10/05/2014    PCP: Noralee Stain, MD  REFERRING PROVIDER: Rosalio Macadamia  REFERRING DIAG: Pt Eval And Tx For Status Post Arthroscopy Of Hip (Z98.890) S/P Rt Hip Scope 05/23/2021 Post hip scope hip flexor Tendonitis-Per Brain Garnette Scheuermann MD   THERAPY DIAG: Pt Eval And Tx For Status Post Arthroscopy Of Hip (V89.381) S/P Rt Hip Scope 05/23/2021 Post hip scope hip flexor Tendonitis-Per Brain Garnette Scheuermann MD  Pain in right hip - Plan: PT plan of care cert/re-cert  Other abnormalities of gait and mobility  Rationale for Evaluation and Treatment Rehabilitation  ONSET DATE: surgery 05/23/2021  SUBJECTIVE:   SUBJECTIVE STATEMENT: Patient had a fall back in September of 2022; surgery 05/23/2021; tatus Post Arthroscopy Of Hip (Z98.890) S/P Rt Hip Scope 05/23/2021 Post hip scope hip flexor Tendonitis-Per Brain Garnette Scheuermann MD  She is having pain daily; has to take something for pain every  day. She cannot lie on her right side to sleep at night. Pain is mostly laterally and posterior; her groin pain has mostly subsided. Pain ranges from 4-8/10; not ever pain free.  PERTINENT HISTORY: Status Post Arthroscopy Of Hip (O17.510) S/P Rt Hip Scope 05/23/2021 Post hip scope hip flexor Tendonitis Hx of pain in both knees.  Left foot plantar fasciitis   PAIN:  Are you having pain? Yes: NPRS scale: 6/10 Pain location: Right side hip and glute Pain description: aching Aggravating factors: twisting Relieving factors: muscle rub; lidocaine patch, ibuprophen  PRECAUTIONS: None  WEIGHT BEARING RESTRICTIONS No  FALLS:  Has patient fallen in last 6 months? No   OCCUPATION: works at [employer]: Independent  PATIENT GOALS : to be pain free   OBJECTIVE:   DIAGNOSTIC FINDINGS: no new imaging  PATIENT SURVEYS:  FOTO 47  COGNITION:  Overall cognitive status: Within functional limits for tasks assessed     SENSATION: WFL  PALPATION: General soreness lateral and posterior hip  LOWER EXTREMITY ROM:  Active ROM Right eval Left eval  Hip flexion 116 122  Hip extension 8 12  Hip abduction    Hip adduction    Hip internal rotation    Hip external rotation    Knee flexion 144 148  Knee extension    Ankle dorsiflexion    Ankle plantarflexion  Ankle inversion    Ankle eversion     (Blank rows = not tested)  LOWER EXTREMITY MMT:  MMT Right eval Left eval  Hip flexion 4+ 5  Hip extension 3+ 4+  Hip abduction 4- 4+  Hip adduction    Hip internal rotation    Hip external rotation    Knee flexion 4+ 4+  Knee extension 4+ 5  Ankle dorsiflexion 5 5  Ankle plantarflexion    Ankle inversion    Ankle eversion     (Blank rows = not tested)  LOWER EXTREMITY SPECIAL TESTS:  Hip special tests: Marcello Moores test: positive  right  FUNCTIONAL TESTS:  2 minute walk test: 404 ft  GAIT: Distance walked: 404 ft Assistive device utilized: None Level of assistance:  Modified independence Comments: slight antalgic gait    TODAY'S TREATMENT: Physical therapy evaluation and HEP instruction   PATIENT EDUCATION:  Education details: Patient educated on exam findings, POC, scope of PT, HEP Person educated: Patient Education method: Consulting civil engineer, Demonstration, and Handouts Education comprehension: verbalized understanding, returned demonstration, verbal cues required, and tactile cues required  HOME EXERCISE PROGRAM: Access Code: [email or web address] URL: [email or web address] Date: 09/27/2021 Prepared by: AP - Rehab  Exercises - Hip Flexor Stretch at Edge of Bed  - 2-3 x daily - 7 x weekly - 1 sets - 5 reps - 10 sec hold  ASSESSMENT:  CLINICAL IMPRESSION: Patient is a 51 y.o. female who was seen today for physical therapy evaluation and treatment for Status Post Arthroscopy Of Hip (I43.329) S/P Rt Hip Scope 05/23/2021 Post hip scope hip flexor Tendonitis.    OBJECTIVE IMPAIRMENTS Abnormal gait, decreased activity tolerance, decreased endurance, decreased mobility, difficulty walking, decreased ROM, decreased strength, hypomobility, increased fascial restrictions, impaired perceived functional ability, increased muscle spasms, impaired flexibility, and pain.   ACTIVITY LIMITATIONS carrying, lifting, bending, standing, squatting, sleeping, stairs, transfers, bed mobility, locomotion level, and caring for others  PARTICIPATION LIMITATIONS: cleaning, community activity, occupation, and yard work  PERSONAL FACTORS Time since onset of injury/illness/exacerbation are also affecting patient's functional outcome.   REHAB POTENTIAL: Good  CLINICAL DECISION MAKING: Stable/uncomplicated  EVALUATION COMPLEXITY: Low   GOALS: Goals reviewed with patient? No  SHORT TERM GOALS: Target date: 10/11/2021  : patient will be independent with initial HEP  Baseline: Goal status: INITIAL  2.  Patient will increase strength R hip extension to 4 to improve  ability to navigate steps reciprocal pattern.  Baseline:  Goal status: INITIAL   LONG TERM GOALS: Target date: 10/25/2021   Patient will be independent in self management strategies to improve quality of life and functional outcomes.  Baseline:  Goal status: INITIAL  2.  Patient will improve right hip and knee MMT's to 4+/5 or better throughout to  promote return to ambulation community distances with minimal deviation.  Baseline:  Goal status: INITIAL  3.   Patient will improve FOTO score to predicted value to demonstrate improved functional mobility. Baseline:  Goal status: INITIAL  4.  Patient will report at least 50% improvement in overall symptoms and/or function to demonstrate improved functional mobility   Baseline:  Goal status: INITIAL  5.  Patient will increase distance on 2MWT to 450 ft to improve functional gait and community mobility.  Baseline:  Goal status: INITIAL    PLAN: PT FREQUENCY: 2x/week  PT DURATION: 4 weeks   PLANNED INTERVENTIONS: Therapeutic exercises, Therapeutic activity, Neuromuscular re-education, Balance training, Gait training, Patient/Family education, Joint manipulation, Joint mobilization, Stair training, Orthotic/Fit training, DME instructions, Aquatic  Therapy, Dry Needling, Electrical stimulation, Spinal manipulation, Spinal mobilization, Cryotherapy, Moist heat, Compression bandaging, scar mobilization, Splintting, Taping, Traction, Ultrasound, Ionotophoresis '4mg'$ /ml Dexamethasone, and Manual therapy  PLAN FOR NEXT SESSION: review of goals and HEP; progress hip strengthening and mobility as able   5:09 PM, 09/27/21 Daequan Kozma Small Janann Boeve MPT [facility] [place] [phone]

## 2021-10-03 ENCOUNTER — Encounter (HOSPITAL_COMMUNITY): Payer: 59

## 2021-10-05 IMAGING — DX DG ANKLE COMPLETE 3+V*L*
3 series · 3 of 3 positions shown · non-contrast
Comparison: None.

CLINICAL DATA: Left ankle pain laterally.  Fall.

EXAM:
LEFT ANKLE COMPLETE - 3+ VIEW

[ankle ap]
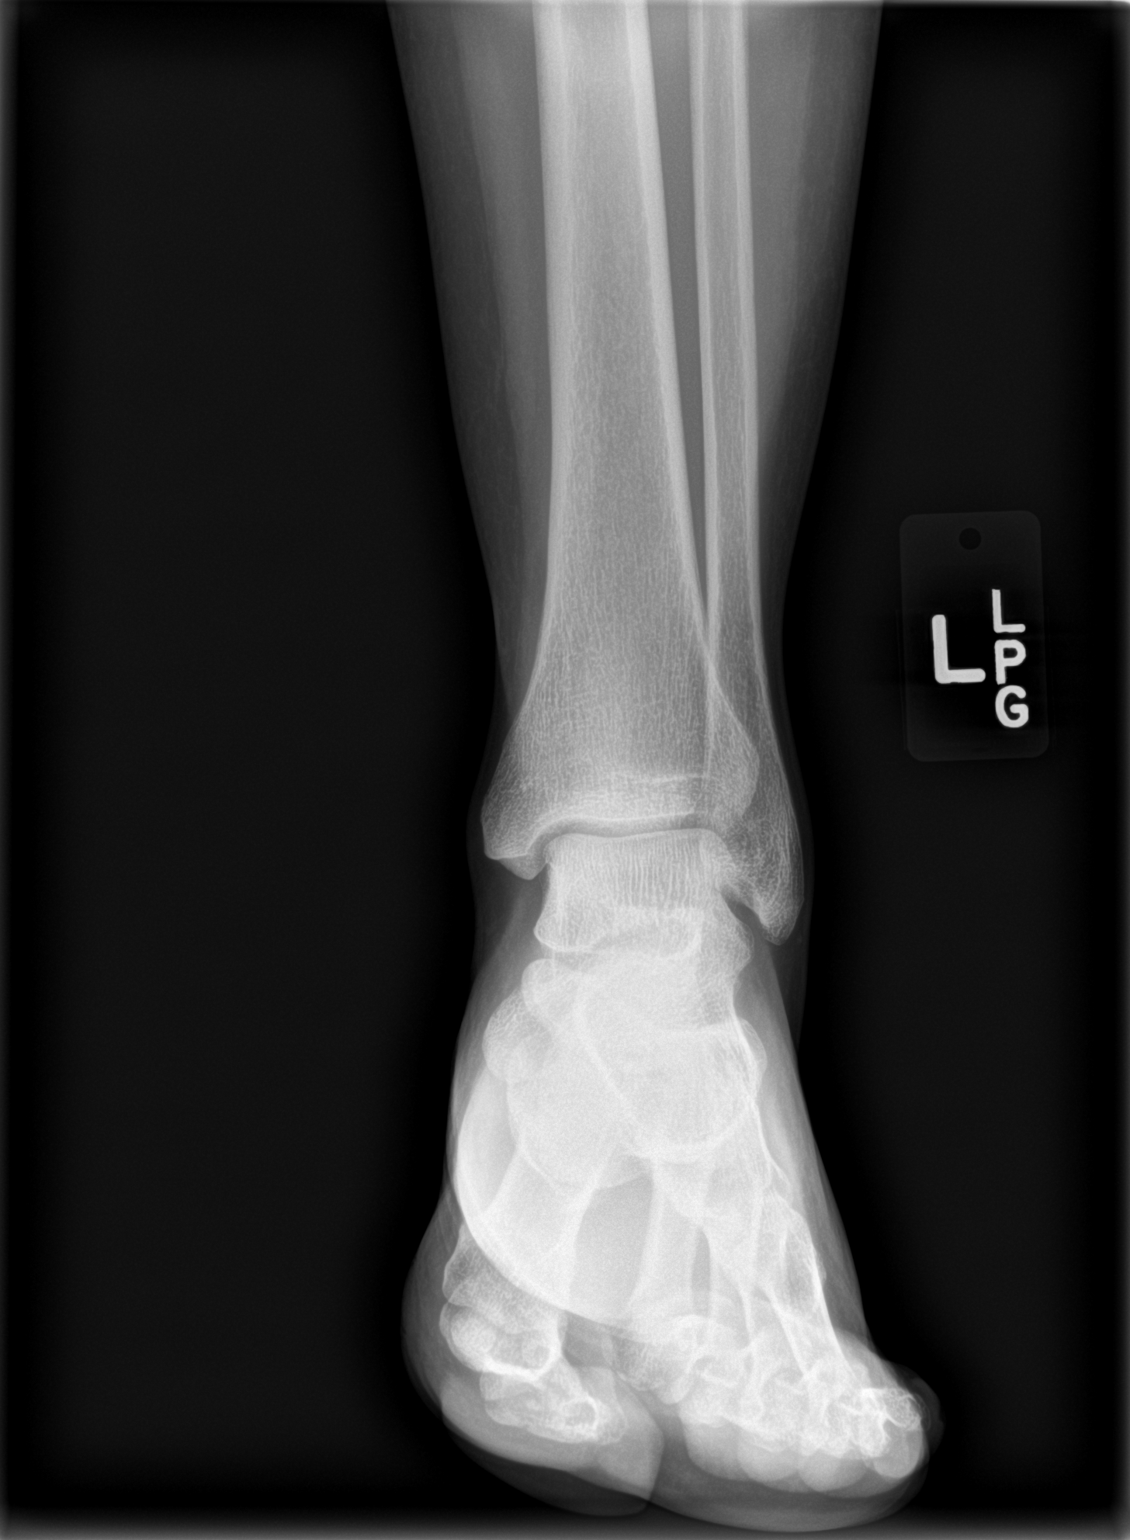

[ankle mortise]
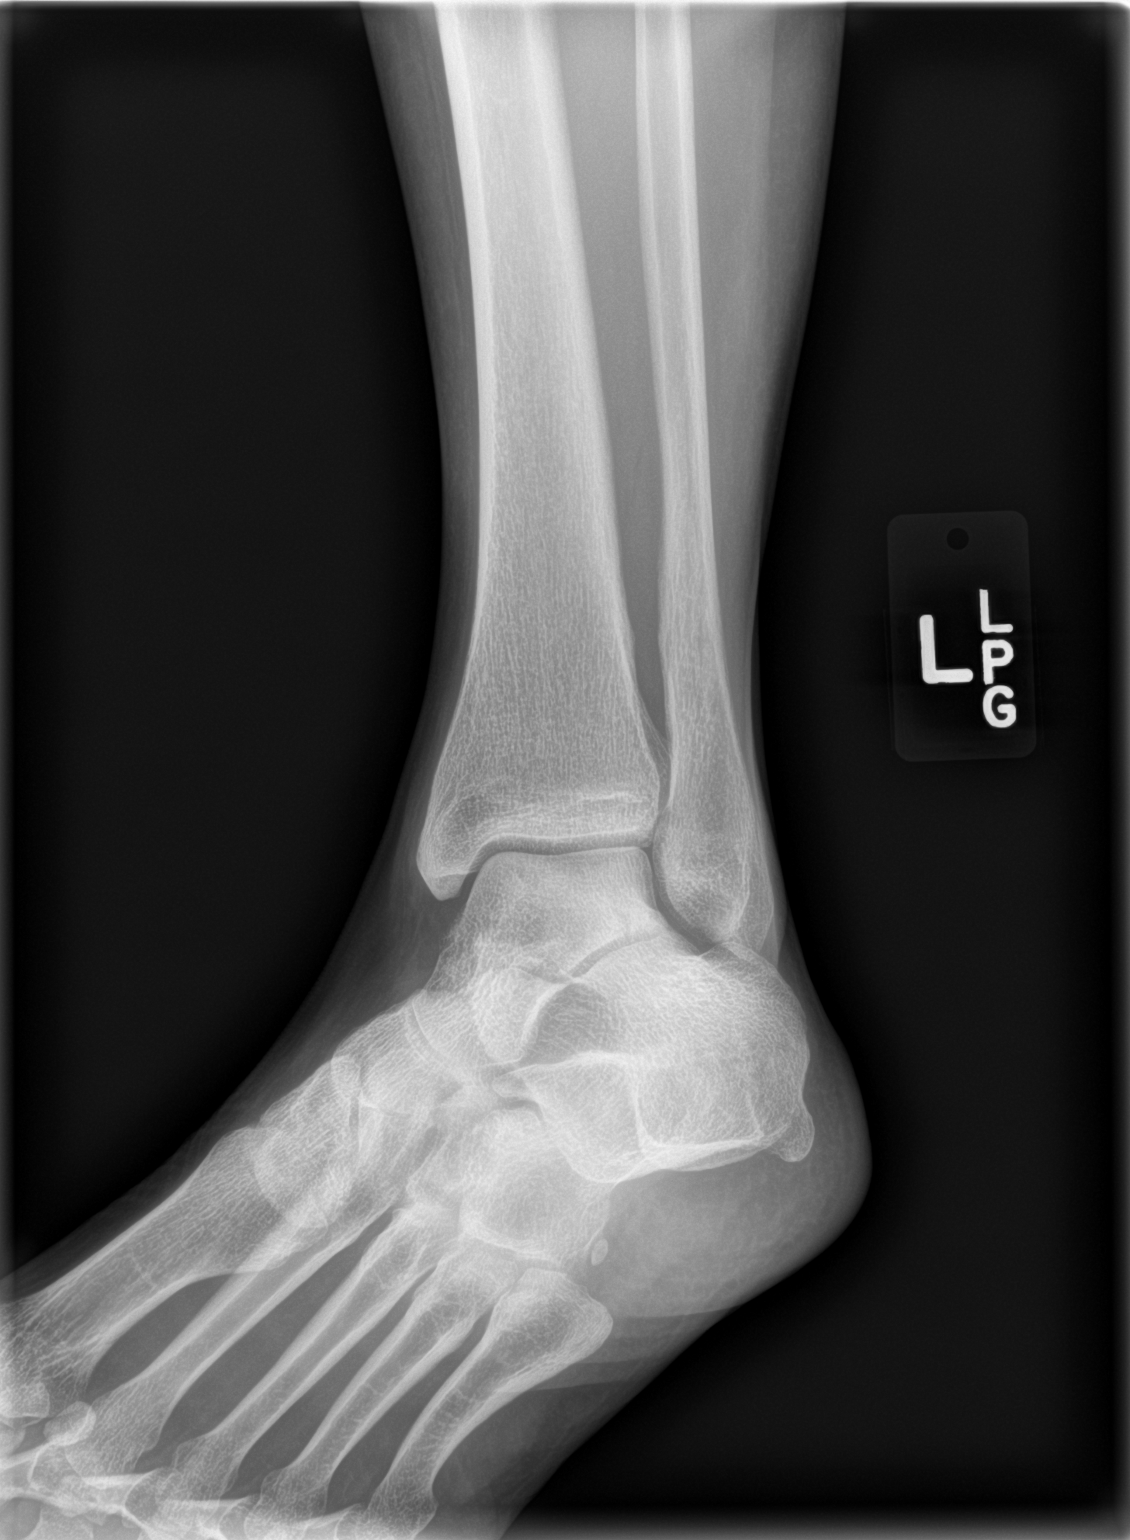

[ankle lat]
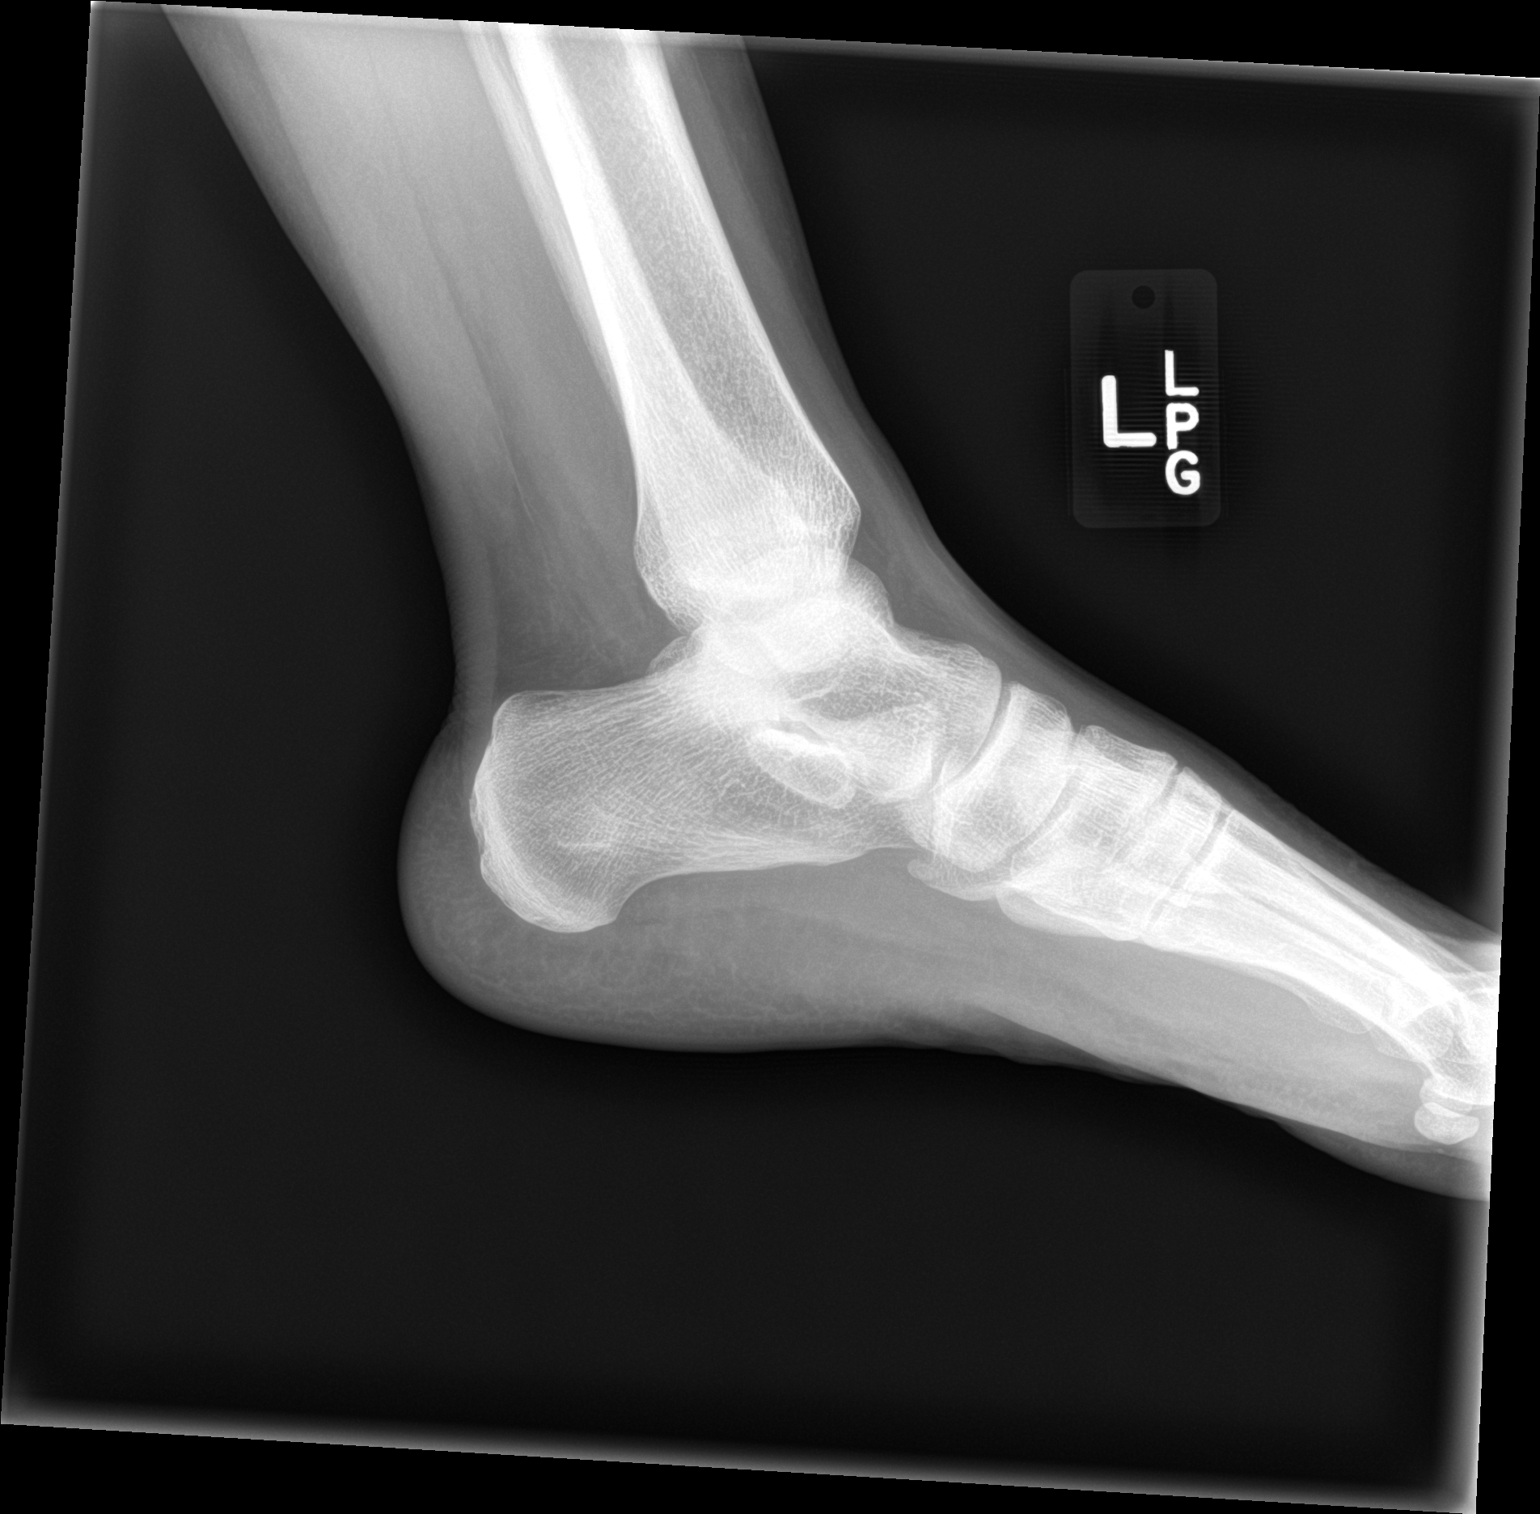

[3 of 3 positions shown; findings below may reference images not displayed]

FINDINGS: There is no evidence of fracture, dislocation, or joint effusion.
There is no evidence of arthropathy or other focal bone abnormality.
Soft tissues are unremarkable.
IMPRESSION: Negative.

## 2021-10-10 ENCOUNTER — Ambulatory Visit (HOSPITAL_COMMUNITY): Payer: 59 | Admitting: Physical Therapy

## 2021-10-10 DIAGNOSIS — M25551 Pain in right hip: Secondary | ICD-10-CM | POA: Diagnosis not present

## 2021-10-10 DIAGNOSIS — R2689 Other abnormalities of gait and mobility: Secondary | ICD-10-CM

## 2021-10-10 NOTE — Therapy (Signed)
OUTPATIENT PHYSICAL THERAPY LOWER EXTREMITY EVALUATION   Patient Name: Virginia Hawkins MRN: [id number] DOB:1970/11/01, 51 y.o., female Today's Date: 10/10/2021   PT End of Session - 10/10/21 1640     Visit Number 2    Number of Visits 8    Date for PT Re-Evaluation 10/25/21    Authorization Type [place] [employer] No auth, no VL, med necessity after 25th visit)    PT Start Time 1602    PT Stop Time 1640    PT Time Calculation (min) 38 min    Activity Tolerance Patient tolerated treatment well    Behavior During Therapy [facility] for tasks assessed/performed              Past Medical History:  Diagnosis Date   Fibromyalgia    Hypertension    Ovarian cyst    Vaginal Pap smear, abnormal    Past Surgical History:  Procedure Laterality Date   CYST EXCISION     FOOT SURGERY Left 2020   for plantar fasciitis   SALPINGECTOMY     Patient Active Problem List   Diagnosis Date Noted   Amenorrhea 05/01/2020   Plantar fasciitis 09/01/2017   History of ovarian cyst 03/23/2015   History of abnormal cervical Pap smear 03/23/2015   Fibromyalgia 03/23/2015   Hypertension, essential 10/05/2014   Vitiligo 10/05/2014    PCP: Noralee Stain, MD  REFERRING PROVIDER: Rosalio Macadamia  REFERRING DIAG: Pt Eval And Tx For Status Post Arthroscopy Of Hip (Z98.890) S/P Rt Hip Scope 05/23/2021 Post hip scope hip flexor Tendonitis-Per Brain Garnette Scheuermann MD   THERAPY DIAG: Pt Eval And Tx For Status Post Arthroscopy Of Hip (G62.694) S/P Rt Hip Scope 05/23/2021 Post hip scope hip flexor Tendonitis-Per Brain Garnette Scheuermann MD  Pain in right hip  Other abnormalities of gait and mobility  Rationale for Evaluation and Treatment Rehabilitation  ONSET DATE: surgery 05/23/2021  SUBJECTIVE:   SUBJECTIVE STATEMENT:  Pt states that she has been doing her stretches. She states that it has seemed to help her pain some.  PERTINENT HISTORY: Status Post Arthroscopy Of Hip (W54.627) S/P Rt Hip  Scope 05/23/2021 Post hip scope hip flexor Tendonitis Hx of pain in both knees.  Left foot plantar fasciitis   PAIN:  Are you having pain? Yes: NPRS scale: 6/10 Pain location: Right side hip and glute Pain description: aching Aggravating factors: twisting Relieving factors: muscle rub; lidocaine patch, ibuprophen   PLOF: Independent  PATIENT GOALS : to be pain free   OBJECTIVE:   DIAGNOSTIC FINDINGS: no new imaging  PATIENT SURVEYS:  FOTO 47  PALPATION: General soreness lateral and posterior hip  LOWER EXTREMITY ROM:  Active ROM Right eval Left eval  Hip flexion 116 122  Hip extension 8 12  Hip abduction    Hip adduction    Hip internal rotation    Hip external rotation    Knee flexion 144 148  Knee extension    Ankle dorsiflexion    Ankle plantarflexion    Ankle inversion    Ankle eversion     (Blank rows = not tested)  LOWER EXTREMITY MMT:  MMT Right eval Left eval  Hip flexion 4+ 5  Hip extension 3+ 4+  Hip abduction 4- 4+  Hip adduction    Hip internal rotation    Hip external rotation    Knee flexion 4+ 4+  Knee extension 4+ 5  Ankle dorsiflexion 5 5  Ankle plantarflexion    Ankle inversion  Ankle eversion     (Blank rows = not tested)  LOWER EXTREMITY SPECIAL TESTS:  Hip special tests: Marcello Moores test: positive  right  FUNCTIONAL TESTS:  2 minute walk test: 404 ft  GAIT: Distance walked: 404 ft Assistive device utilized: None Level of assistance: Modified independence Comments: slight antalgic gait    TODAY'S TREATMENT:             10/10/21:  Reviewed evaluation and goals             Supine:  glut set x 10, hip adductor isometric x 10, clam with red t-band x 10; hip flexion isometric x 10, bridge x 10. Hip flexor stretch 20" x 3              Side lying:   hip abduction x10             Prone:  hip extension.             Standing: heel raises x 10             Mini-squat x 10             Leg press:  2 pl x 10 x 2               Nustep Level 2 hills 2 for 5 minutes          09/27/2021 Physical therapy evaluation and HEP instruction   PATIENT EDUCATION:  Education details: Patient educated on exam findings, POC, scope of PT, HEP Person educated: Patient Education method: Consulting civil engineer, Demonstration, and Handouts Education comprehension: verbalized understanding, returned demonstration, verbal cues required, and tactile cues required  HOME EXERCISE PROGRAM:              7/19:  hip isometric for adduction and flexion, clam with red theraband.  Access Code: [email or web address] URL: [email or web address] Date: 09/27/2021 Prepared by: AP - Rehab  Exercises - Hip Flexor Stretch at Edge of Bed  - 2-3 x daily - 7 x weekly - 1 sets - 5 reps - 10 sec hold  ASSESSMENT:  CLINICAL IMPRESSION: Evaluation and goals reviewed with pt. Therapist began strengthening exercises with Rt him to improve stability and decrease pain.  Updated HEP.  Pt continues to have significant pain in Rt hip and will continue to benefit from skilled PT to decrease pain and improve strength.  OBJECTIVE IMPAIRMENTS Abnormal gait, decreased activity tolerance, decreased endurance, decreased mobility, difficulty walking, decreased ROM, decreased strength, hypomobility, increased fascial restrictions, impaired perceived functional ability, increased muscle spasms, impaired flexibility, and pain.   ACTIVITY LIMITATIONS carrying, lifting, bending, standing, squatting, sleeping, stairs, transfers, bed mobility, locomotion level, and caring for others  PARTICIPATION LIMITATIONS: cleaning, community activity, occupation, and yard work  PERSONAL FACTORS Time since onset of injury/illness/exacerbation are also affecting patient's functional outcome.   REHAB POTENTIAL: Good  CLINICAL DECISION MAKING: Stable/uncomplicated  EVALUATION COMPLEXITY: Low   GOALS: Goals reviewed with patient? No  SHORT TERM GOALS: Target date: 10/11/2021  : patient will be  independent with initial HEP  Baseline: Goal status: IN PROGRESS  2.  Patient will increase strength R hip extension to 4 to improve ability to navigate steps reciprocal pattern.  Baseline:  Goal status: IN PROGRESS   LONG TERM GOALS: Target date: 10/25/2021   Patient will be independent in self management strategies to improve quality of life and functional outcomes.  Baseline:  Goal status: IN PROGRESS  2.  Patient will improve right hip and knee  MMT's to 4+/5 or better throughout to  promote return to ambulation community distances with minimal deviation.  Baseline:  Goal status: IN PROGRESS  3.   Patient will improve FOTO score to predicted value to demonstrate improved functional mobility. Baseline:  Goal status: IN PROGRESS  4.  Patient will report at least 50% improvement in overall symptoms and/or function to demonstrate improved functional mobility   Baseline:  Goal status: IN PROGRESS  5.  Patient will increase distance on 2MWT to 450 ft to improve functional gait and community mobility.  Baseline:  Goal status: IN PROGRESS    PLAN: PT FREQUENCY: 2x/week  PT DURATION: 4 weeks   PLANNED INTERVENTIONS: Therapeutic exercises, Therapeutic activity, Neuromuscular re-education, Balance training, Gait training, Patient/Family education, Joint manipulation, Joint mobilization, Stair training, Orthotic/Fit training, DME instructions, Aquatic Therapy, Dry Needling, Electrical stimulation, Spinal manipulation, Spinal mobilization, Cryotherapy, Moist heat, Compression bandaging, scar mobilization, Splintting, Taping, Traction, Ultrasound, Ionotophoresis '4mg'$ /ml Dexamethasone, and Manual therapy  PLAN FOR NEXT SESSION:  progress hip strengthening and mobility as able  Rayetta Humphrey, PT CLT [phone]  1640

## 2021-10-12 ENCOUNTER — Ambulatory Visit (INDEPENDENT_AMBULATORY_CARE_PROVIDER_SITE_OTHER): Payer: 59 | Admitting: Podiatry

## 2021-10-12 DIAGNOSIS — M722 Plantar fascial fibromatosis: Secondary | ICD-10-CM

## 2021-10-12 DIAGNOSIS — M7672 Peroneal tendinitis, left leg: Secondary | ICD-10-CM

## 2021-10-12 NOTE — Progress Notes (Unsigned)
Subjective: Chief Complaint  Patient presents with   Plantar Fasciitis    Pt came in today for Left foot and ankle pain, pt is doing worse than last visit, Rate of pain 8 out of 10. Ache and burning, TX: doing P.[facility] out pt Rehab but only for hip pt needs referral  for the foot.    States she had hip surgery March 1st and is still in therapy for it. She states she fell which started. Now that she is back at work the foot is starting to hurt but when she was out of work the foot was better as she was not up on it as much. She is still on reduced hours at work for her hip. She states she has been getting some pain to the ankle and it feels like it soing ot give way. She points to the posterior aspect of the ankle along the peroneal tendon and along the dorsal lateral foot. No injury to the foot.   Denies any systemic complaints such as fevers, chills, nausea, vomiting. No acute changes since last appointment, and no other complaints at this time.   Objective: AAO x3, NAD DP/PT pulses palpable bilaterally, CRT less than 3 seconds Protective sensation intact with Simms Weinstein monofilament, vibratory sensation intact, Achilles tendon reflex intact No areas of pinpoint bony tenderness or pain with vibratory sensation. MMT 5/5, ROM WNL. No edema, erythema, increase in warmth to bilateral lower extremities.  No open lesions or pre-ulcerative lesions.  No pain with calf compression, swelling, warmth, erythema  Assessment:  Plan: -All treatment options discussed with the patient including all alternatives, risks, complications.  -PT -[place] -Patient encouraged to call the office with any questions, concerns, change in symptoms.

## 2021-10-12 NOTE — Patient Instructions (Signed)
For instructions on how to put on your Tri-Lock Ankle Brace, please visit [email or web address] 

## 2021-10-17 ENCOUNTER — Encounter (HOSPITAL_COMMUNITY): Payer: Self-pay

## 2021-10-17 ENCOUNTER — Ambulatory Visit (HOSPITAL_COMMUNITY): Payer: 59

## 2021-10-17 DIAGNOSIS — R2689 Other abnormalities of gait and mobility: Secondary | ICD-10-CM

## 2021-10-17 DIAGNOSIS — M25551 Pain in right hip: Secondary | ICD-10-CM | POA: Diagnosis not present

## 2021-10-17 NOTE — Therapy (Signed)
OUTPATIENT PHYSICAL THERAPY LOWER EXTREMITY EVALUATION   Patient Name: Virginia Hawkins MRN: [id number] DOB:May 26, 1970, 51 y.o., female Today's Date: 10/17/2021   PT End of Session - 10/17/21 1609     Visit Number 3    Number of Visits 8    Date for PT Re-Evaluation 10/25/21    Authorization Type [place] [employer] No auth, no VL, med necessity after 25th visit)    Authorization - Visit Number --    Authorization - Number of Visits --    Progress Note Due on Visit 8    PT Start Time 1610   late sign in   PT Stop Time 1641    PT Time Calculation (min) 31 min    Activity Tolerance Patient tolerated treatment well    Behavior During Therapy [facility] for tasks assessed/performed              Past Medical History:  Diagnosis Date   Fibromyalgia    Hypertension    Ovarian cyst    Vaginal Pap smear, abnormal    Past Surgical History:  Procedure Laterality Date   CYST EXCISION     FOOT SURGERY Left 2020   for plantar fasciitis   SALPINGECTOMY     Patient Active Problem List   Diagnosis Date Noted   Amenorrhea 05/01/2020   Plantar fasciitis 09/01/2017   History of ovarian cyst 03/23/2015   History of abnormal cervical Pap smear 03/23/2015   Fibromyalgia 03/23/2015   Hypertension, essential 10/05/2014   Vitiligo 10/05/2014    PCP: Noralee Stain, MD  REFERRING PROVIDER: Rosalio Macadamia  REFERRING DIAG: Pt Eval And Tx For Status Post Arthroscopy Of Hip (Z98.890) S/P Rt Hip Scope 05/23/2021 Post hip scope hip flexor Tendonitis-Per Brain Garnette Scheuermann MD   THERAPY DIAG: Pt Eval And Tx For Status Post Arthroscopy Of Hip (B09.628) S/P Rt Hip Scope 05/23/2021 Post hip scope hip flexor Tendonitis-Per Brain Garnette Scheuermann MD  Pain in right hip  Other abnormalities of gait and mobility  Rationale for Evaluation and Treatment Rehabilitation  ONSET DATE: surgery 05/23/2021  SUBJECTIVE:   SUBJECTIVE STATEMENT:  Pt reports increased pain and soreness, pain scale  8.5/10 today.  Reports she has been compliant with HEP daily.  Stretches do help with pain.     PERTINENT HISTORY: Status Post Arthroscopy Of Hip (Z66.294) S/P Rt Hip Scope 05/23/2021 Post hip scope hip flexor Tendonitis Hx of pain in both knees.  Left foot plantar fasciitis   PAIN:  Are you having pain? Yes: NPRS scale: 6/10 Pain location: Right side hip and glute Pain description: aching Aggravating factors: twisting Relieving factors: muscle rub; lidocaine patch, ibuprophen   PLOF: Independent  PATIENT GOALS : to be pain free   OBJECTIVE:   DIAGNOSTIC FINDINGS: no new imaging  PATIENT SURVEYS:  FOTO 47  PALPATION: General soreness lateral and posterior hip  LOWER EXTREMITY ROM:  Active ROM Right eval Left eval  Hip flexion 116 122  Hip extension 8 12  Hip abduction    Hip adduction    Hip internal rotation    Hip external rotation    Knee flexion 144 148  Knee extension    Ankle dorsiflexion    Ankle plantarflexion    Ankle inversion    Ankle eversion     (Blank rows = not tested)  LOWER EXTREMITY MMT:  MMT Right eval Left eval  Hip flexion 4+ 5  Hip extension 3+ 4+  Hip abduction 4- 4+  Hip adduction  Hip internal rotation    Hip external rotation    Knee flexion 4+ 4+  Knee extension 4+ 5  Ankle dorsiflexion 5 5  Ankle plantarflexion    Ankle inversion    Ankle eversion     (Blank rows = not tested)  LOWER EXTREMITY SPECIAL TESTS:  Hip special tests: Marcello Moores test: positive  right  FUNCTIONAL TESTS:  2 minute walk test: 404 ft  GAIT: Distance walked: 404 ft Assistive device utilized: None Level of assistance: Modified independence Comments: slight antalgic gait    TODAY'S TREATMENT:             10/17/21:  Nustep Level 3 hills 2 for 5 minutes dynamic warm up, SPM >80  3D hip excursion (lateral weight shift, rotation, squat, extend)  Lunge onto 6in step height 10x each, no HHA   Leg press:  2 pl x 10 x 2 Cueing to reduce  valgus for Lt knee pain/popping  Prone: hip extension 10x   Sidelying: abduction   Supine bridge 2x 10   Hip flexion isometric 10x 5"   Thomas stretch on edge of bed Rt only 2x 30"   10/10/21:  Reviewed evaluation and goals             Supine:  glut set x 10, hip adductor isometric x 10, clam with red t-band x 10; hip flexion isometric x 10, bridge x 10. Hip flexor stretch 20" x 3              Side lying:   hip abduction x10             Prone:  hip extension.             Standing: heel raises x 10             Mini-squat x 10             Leg press:  2 pl x 10 x 2              Nustep Level 2 hills 2 for 5 minutes          09/27/2021 Physical therapy evaluation and HEP instruction   PATIENT EDUCATION:  Education details: Patient educated on exam findings, POC, scope of PT, HEP Person educated: Patient Education method: Consulting civil engineer, Demonstration, and Handouts Education comprehension: verbalized understanding, returned demonstration, verbal cues required, and tactile cues required  HOME EXERCISE PROGRAM:              7/19:  hip isometric for adduction and flexion, clam with red theraband.  Access Code: [email or web address] URL: [email or web address] Date: 09/27/2021 Prepared by: [facility] - Rehab  Exercises - Hip Flexor Stretch at Edge of Bed  - 2-3 x daily - 7 x weekly - 1 sets - 5 reps - 10 sec hold  ASSESSMENT:  CLINICAL IMPRESSION: Pt limited with increased soreness and pain initial this session, began session with dynamic warm up and additional mobility exercises to assist with pain.  Progressed functional strength with additional full ROM squat and lunges, pt continues to be limited by pain.  Decreased tolerance for laying on Rt side during abduction exercise.    OBJECTIVE IMPAIRMENTS Abnormal gait, decreased activity tolerance, decreased endurance, decreased mobility, difficulty walking, decreased ROM, decreased strength, hypomobility, increased fascial restrictions, impaired  perceived functional ability, increased muscle spasms, impaired flexibility, and pain.   ACTIVITY LIMITATIONS carrying, lifting, bending, standing, squatting, sleeping, stairs, transfers, bed mobility, locomotion level, and caring for others  PARTICIPATION  LIMITATIONS: cleaning, community activity, occupation, and yard work  PERSONAL FACTORS Time since onset of injury/illness/exacerbation are also affecting patient's functional outcome.   REHAB POTENTIAL: Good  CLINICAL DECISION MAKING: Stable/uncomplicated  EVALUATION COMPLEXITY: Low   GOALS: Goals reviewed with patient? No  SHORT TERM GOALS: Target date: 10/11/2021  : patient will be independent with initial HEP  Baseline: Goal status: IN PROGRESS  2.  Patient will increase strength R hip extension to 4 to improve ability to navigate steps reciprocal pattern.  Baseline:  Goal status: IN PROGRESS   LONG TERM GOALS: Target date: 10/25/2021   Patient will be independent in self management strategies to improve quality of life and functional outcomes.  Baseline:  Goal status: IN PROGRESS  2.  Patient will improve right hip and knee MMT's to 4+/5 or better throughout to  promote return to ambulation community distances with minimal deviation.  Baseline:  Goal status: IN PROGRESS  3.   Patient will improve FOTO score to predicted value to demonstrate improved functional mobility. Baseline:  Goal status: IN PROGRESS  4.  Patient will report at least 50% improvement in overall symptoms and/or function to demonstrate improved functional mobility   Baseline:  Goal status: IN PROGRESS  5.  Patient will increase distance on 2MWT to 450 ft to improve functional gait and community mobility.  Baseline:  Goal status: IN PROGRESS    PLAN: PT FREQUENCY: 2x/week  PT DURATION: 4 weeks   PLANNED INTERVENTIONS: Therapeutic exercises, Therapeutic activity, Neuromuscular re-education, Balance training, Gait training,  Patient/Family education, Joint manipulation, Joint mobilization, Stair training, Orthotic/Fit training, DME instructions, Aquatic Therapy, Dry Needling, Electrical stimulation, Spinal manipulation, Spinal mobilization, Cryotherapy, Moist heat, Compression bandaging, scar mobilization, Splintting, Taping, Traction, Ultrasound, Ionotophoresis '4mg'$ /ml Dexamethasone, and Manual therapy  PLAN FOR NEXT SESSION:  progress hip strengthening and mobility as able  Ihor Austin, LPTA/CLT; Delana Meyer [phone] 10/17/21 1645

## 2021-10-22 ENCOUNTER — Encounter (HOSPITAL_COMMUNITY): Payer: 59 | Admitting: Physical Therapy

## 2021-10-29 ENCOUNTER — Other Ambulatory Visit (HOSPITAL_COMMUNITY): Payer: Self-pay

## 2021-10-29 ENCOUNTER — Encounter (HOSPITAL_COMMUNITY): Payer: 59 | Admitting: Physical Therapy

## 2021-10-30 ENCOUNTER — Ambulatory Visit (HOSPITAL_COMMUNITY)
Admission: RE | Admit: 2021-10-30 | Discharge: 2021-10-30 | Disposition: A | Discharge status: Home / Self Care | Payer: PRIVATE HEALTH INSURANCE | Source: Ambulatory Visit | Attending: Nurse Practitioner | Admitting: Nurse Practitioner

## 2021-10-30 ENCOUNTER — Other Ambulatory Visit (HOSPITAL_COMMUNITY): Payer: Self-pay | Admitting: Nurse Practitioner

## 2021-10-30 DIAGNOSIS — M25551 Pain in right hip: Secondary | ICD-10-CM | POA: Diagnosis not present

## 2021-10-30 DIAGNOSIS — M25511 Pain in right shoulder: Secondary | ICD-10-CM | POA: Diagnosis not present

## 2021-10-30 DIAGNOSIS — S8002XA Contusion of left knee, initial encounter: Secondary | ICD-10-CM | POA: Diagnosis not present

## 2021-10-30 DIAGNOSIS — W19XXXA Unspecified fall, initial encounter: Secondary | ICD-10-CM | POA: Diagnosis not present

## 2021-10-30 DIAGNOSIS — S40011A Contusion of right shoulder, initial encounter: Secondary | ICD-10-CM | POA: Diagnosis not present

## 2021-10-30 DIAGNOSIS — Z9181 History of falling: Secondary | ICD-10-CM | POA: Diagnosis not present

## 2021-10-30 DIAGNOSIS — R102 Pelvic and perineal pain: Secondary | ICD-10-CM | POA: Diagnosis not present

## 2021-11-06 ENCOUNTER — Ambulatory Visit: Payer: 59 | Admitting: Podiatry

## 2021-11-06 ENCOUNTER — Other Ambulatory Visit (HOSPITAL_COMMUNITY): Payer: Self-pay

## 2021-11-06 MED ORDER — TRAMADOL HCL 50 MG PO TABS
50.0000 mg | ORAL_TABLET | Freq: Four times a day (QID) | ORAL | 0 refills | Status: DC | PRN
  Filled 2021-11-06: qty 20, 5d supply, fill #0

## 2021-11-07 ENCOUNTER — Other Ambulatory Visit (HOSPITAL_COMMUNITY): Payer: Self-pay | Admitting: Orthopedic Surgery

## 2021-11-07 ENCOUNTER — Other Ambulatory Visit: Payer: Self-pay | Admitting: Orthopedic Surgery

## 2021-11-07 DIAGNOSIS — M25511 Pain in right shoulder: Secondary | ICD-10-CM

## 2021-11-07 DIAGNOSIS — M25551 Pain in right hip: Secondary | ICD-10-CM

## 2021-11-13 ENCOUNTER — Other Ambulatory Visit (HOSPITAL_COMMUNITY): Payer: Self-pay

## 2021-11-20 ENCOUNTER — Other Ambulatory Visit (HOSPITAL_COMMUNITY): Payer: Self-pay

## 2021-11-23 ENCOUNTER — Other Ambulatory Visit (HOSPITAL_COMMUNITY): Payer: Self-pay | Admitting: Orthopedic Surgery

## 2021-11-23 ENCOUNTER — Ambulatory Visit (HOSPITAL_COMMUNITY)
Admission: RE | Admit: 2021-11-23 | Discharge: 2021-11-23 | Disposition: A | Discharge status: Home / Self Care | Payer: PRIVATE HEALTH INSURANCE | Source: Ambulatory Visit | Attending: Orthopedic Surgery | Admitting: Orthopedic Surgery

## 2021-11-23 DIAGNOSIS — M24151 Other articular cartilage disorders, right hip: Secondary | ICD-10-CM | POA: Diagnosis not present

## 2021-11-23 DIAGNOSIS — M25511 Pain in right shoulder: Secondary | ICD-10-CM | POA: Insufficient documentation

## 2021-11-23 DIAGNOSIS — M25551 Pain in right hip: Secondary | ICD-10-CM | POA: Diagnosis not present

## 2021-11-29 ENCOUNTER — Other Ambulatory Visit (HOSPITAL_COMMUNITY): Payer: Self-pay

## 2021-11-29 MED ORDER — MELOXICAM 15 MG PO TABS
15.0000 mg | ORAL_TABLET | Freq: Every day | ORAL | 2 refills | Status: DC
  Filled 2021-11-29: qty 30, 30d supply, fill #0

## 2021-11-30 ENCOUNTER — Other Ambulatory Visit (HOSPITAL_COMMUNITY): Payer: Self-pay

## 2021-12-03 ENCOUNTER — Other Ambulatory Visit (HOSPITAL_COMMUNITY): Payer: Self-pay

## 2021-12-11 ENCOUNTER — Other Ambulatory Visit (HOSPITAL_COMMUNITY): Payer: Self-pay

## 2021-12-17 ENCOUNTER — Ambulatory Visit: Payer: 59 | Admitting: Podiatry

## 2021-12-24 ENCOUNTER — Other Ambulatory Visit (HOSPITAL_COMMUNITY): Payer: Self-pay

## 2021-12-25 ENCOUNTER — Ambulatory Visit: Payer: 59 | Admitting: Podiatry

## 2021-12-31 ENCOUNTER — Ambulatory Visit (HOSPITAL_COMMUNITY): Payer: 59 | Admitting: Physical Therapy

## 2022-01-01 ENCOUNTER — Ambulatory Visit: Payer: 59 | Admitting: Podiatry

## 2022-01-03 ENCOUNTER — Ambulatory Visit: Payer: 59 | Admitting: Podiatry

## 2022-01-03 DIAGNOSIS — M7672 Peroneal tendinitis, left leg: Secondary | ICD-10-CM

## 2022-01-03 DIAGNOSIS — M7752 Other enthesopathy of left foot: Secondary | ICD-10-CM

## 2022-01-03 NOTE — Progress Notes (Signed)
Subjective: Chief Complaint  Patient presents with   Foot Pain    Patient came in for a follow-up, patient is still having pain on th lateral side of the foot, rates the pain a 6 1/2 out of 21, TX: Brace patient is unable to wear the brace     51 year old female presents for above concerns.  She is still doing PT for the hip so she has not been able to do any physical therapy for her foot.  She is continue with the other treatments including home rehab, supportive shoes, bracing.  She thinks that the brace makes it worse. No recent injury or changes.  Objective: AAO x3, NAD DP/PT pulses palpable bilaterally, CRT less than 3 seconds Decreased medial arch upon weightbearing left foot.  There is purulent sinus tarsi and just inferior to the distal fibula on the left foot.  Tenderness along the course of the peroneal tendon as well but clinically appears to be intact.  Trace edema.  No erythema or warmth.  MMT 5/5. No pain with calf compression, swelling, warmth, erythema  Assessment: 51 year old female with left foot capsulitis, tendinitis  Plan: -All treatment options discussed with the patient including all alternatives, risks, complications.  -I like for her to do physical therapy when able I think it would beneficial for her.  Continue with the home stretching exercises.  Continue supportive shoe gear.  As she is continue to have symptoms we discussed an MRI she was to have this done.  I ordered an MRI of the left ankle to further evaluate the integrity of the peroneal tendon as well as the sinus tarsi. -Patient encouraged to call the office with any questions, concerns, change in symptoms.   Trula Slade DPM

## 2022-01-20 ENCOUNTER — Ambulatory Visit (HOSPITAL_COMMUNITY)
Admission: RE | Admit: 2022-01-20 | Discharge: 2022-01-20 | Disposition: A | Discharge status: Home / Self Care | Payer: 59 | Source: Ambulatory Visit | Attending: Podiatry | Admitting: Podiatry

## 2022-01-20 DIAGNOSIS — M7752 Other enthesopathy of left foot: Secondary | ICD-10-CM | POA: Insufficient documentation

## 2022-01-20 DIAGNOSIS — M25572 Pain in left ankle and joints of left foot: Secondary | ICD-10-CM | POA: Diagnosis not present

## 2022-01-20 DIAGNOSIS — R600 Localized edema: Secondary | ICD-10-CM | POA: Diagnosis not present

## 2022-01-20 DIAGNOSIS — M7672 Peroneal tendinitis, left leg: Secondary | ICD-10-CM | POA: Diagnosis not present

## 2022-01-23 ENCOUNTER — Encounter: Payer: Self-pay | Admitting: Podiatry

## 2022-01-28 ENCOUNTER — Ambulatory Visit (INDEPENDENT_AMBULATORY_CARE_PROVIDER_SITE_OTHER): Payer: 59 | Admitting: Podiatry

## 2022-01-28 ENCOUNTER — Other Ambulatory Visit (HOSPITAL_COMMUNITY): Payer: Self-pay

## 2022-01-28 ENCOUNTER — Ambulatory Visit (INDEPENDENT_AMBULATORY_CARE_PROVIDER_SITE_OTHER): Payer: 59

## 2022-01-28 DIAGNOSIS — G8929 Other chronic pain: Secondary | ICD-10-CM | POA: Diagnosis not present

## 2022-01-28 DIAGNOSIS — M79672 Pain in left foot: Secondary | ICD-10-CM

## 2022-01-28 DIAGNOSIS — M76829 Posterior tibial tendinitis, unspecified leg: Secondary | ICD-10-CM

## 2022-01-28 DIAGNOSIS — M7752 Other enthesopathy of left foot: Secondary | ICD-10-CM

## 2022-01-28 DIAGNOSIS — M7672 Peroneal tendinitis, left leg: Secondary | ICD-10-CM

## 2022-02-05 NOTE — Progress Notes (Signed)
Subjective: No chief complaint on file.   51 year old female presents today to discuss MRI results.  She states that she is having discomfort.  Majority the pain to the lateral aspect the ankle but she does state occasionally she gets it to the medial aspect she points to.  No recent injury or change otherwise.    Objective: AAO x3, NAD DP/PT pulses palpable bilaterally, CRT less than 3 seconds Decreased medial arch upon weightbearing left foot.  Majority discomfort still in the lateral aspect of foot along the course of the peroneal tendon, sinus tarsi.  There is decreased medial arch upon weightbearing.  No significant pain on the course the posterior tibial tendon today. No pain with calf compression, swelling, warmth, erythema  Assessment: 51 year old female with left foot capsulitis, tendinitis  Plan: -All treatment options discussed with the patient including all alternatives, risks, complications.  -We again discussed the MRI.  Patient's had multiple issues with his foot including plantar fasciitis, arch pain for quite some time now with tendinitis.  Given the ongoing ankle discomfort we discussed both conservative as well as surgical treatment options.  Previously ordered physical therapy which she had not yet started this.  We discussed surgical intervention for try to help correct her overall foot structure.  I will discuss with the group and we will be in contact.  Trula Slade DPM

## 2022-02-07 ENCOUNTER — Ambulatory Visit: Payer: 59 | Admitting: Podiatry

## 2022-02-13 ENCOUNTER — Encounter: Payer: Self-pay | Admitting: Emergency Medicine

## 2022-02-13 ENCOUNTER — Ambulatory Visit
Admission: EM | Admit: 2022-02-13 | Discharge: 2022-02-13 | Disposition: A | Discharge status: Home / Self Care | Payer: 59 | Attending: Family Medicine | Admitting: Family Medicine

## 2022-02-13 ENCOUNTER — Other Ambulatory Visit (HOSPITAL_COMMUNITY): Payer: Self-pay

## 2022-02-13 ENCOUNTER — Encounter: Payer: Self-pay | Admitting: Podiatry

## 2022-02-13 DIAGNOSIS — R059 Cough, unspecified: Secondary | ICD-10-CM | POA: Diagnosis not present

## 2022-02-13 DIAGNOSIS — J069 Acute upper respiratory infection, unspecified: Secondary | ICD-10-CM | POA: Diagnosis not present

## 2022-02-13 DIAGNOSIS — Z1152 Encounter for screening for COVID-19: Secondary | ICD-10-CM | POA: Insufficient documentation

## 2022-02-13 LAB — RESP PANEL BY RT-PCR (FLU A&B, COVID) ARPGX2
Influenza A by PCR: NEGATIVE
Influenza B by PCR: NEGATIVE
SARS Coronavirus 2 by RT PCR: NEGATIVE

## 2022-02-13 MED ORDER — PREDNISONE 20 MG PO TABS
40.0000 mg | ORAL_TABLET | Freq: Every day | ORAL | 0 refills | Status: DC
  Filled 2022-02-13: qty 10, 5d supply, fill #0

## 2022-02-13 MED ORDER — PROMETHAZINE-DM 6.25-15 MG/5ML PO SYRP
5.0000 mL | ORAL_SOLUTION | Freq: Four times a day (QID) | ORAL | 0 refills | Status: DC | PRN
  Filled 2022-02-13: qty 100, 5d supply, fill #0

## 2022-02-13 NOTE — ED Triage Notes (Signed)
Runny nose, cough, chest congestion, x 3 days.  Productive cough with yellow sputum.

## 2022-02-13 NOTE — ED Provider Notes (Signed)
[facility]    CSN: [id number] Arrival date & time: 02/13/22  1026      History   Chief Complaint No chief complaint on file.   HPI Virginia Hawkins is a 51 y.o. female.   Patient presenting today with 3-day history of hacking cough, nasal congestion, fatigue, headache, fever.  Denies chest pain, shortness of breath, abdominal pain, nausea vomiting or diarrhea.  So far taking over-the-counter cough remedies with minimal relief.  No known history of chronic pulmonary disease.    Past Medical History:  Diagnosis Date   Fibromyalgia    Hypertension    Ovarian cyst    Vaginal Pap smear, abnormal     Patient Active Problem List   Diagnosis Date Noted   Amenorrhea 05/01/2020   Plantar fasciitis 09/01/2017   History of ovarian cyst 03/23/2015   History of abnormal cervical Pap smear 03/23/2015   Fibromyalgia 03/23/2015   Hypertension, essential 10/05/2014   Vitiligo 10/05/2014    Past Surgical History:  Procedure Laterality Date   CYST EXCISION     FOOT SURGERY Left 2020   for plantar fasciitis   SALPINGECTOMY      OB History     Gravida  3   Para  2   Term  2   Preterm      AB  1   Living  2      SAB      IAB      Ectopic  1   Multiple      Live Births  2            Home Medications    Prior to Admission medications   Medication Sig Start Date End Date Taking? Authorizing Provider  predniSONE (DELTASONE) 20 MG tablet Take 2 tablets (40 mg total) by mouth daily with breakfast. 02/13/22  Yes Volney American, PA-C  promethazine-dextromethorphan (PROMETHAZINE-DM) 6.25-15 MG/5ML syrup Take 5 mLs by mouth 4 (four) times daily as needed. 02/13/22  Yes Volney American, PA-C  fluticasone [facility]) 50 MCG/ACT nasal spray Place into the nose. 09/22/19 09/21/20  [provider]  gabapentin (NEURONTIN) 300 MG capsule Take 1 capsule (300 mg total) by mouth 3 (three) times daily as needed. 02/14/21      HYDROcodone-acetaminophen (NORCO/VICODIN) 5-325 MG tablet Take 1 tablet by mouth every 6 (six) hours as needed for pain 09/28/20     ibuprofen (ADVIL) 800 MG tablet Take 1 tablet (800 mg total) by mouth every 8 (eight) hours as needed for pain 04/27/21     losartan (COZAAR) 100 MG tablet Take 1 tablet (100 mg total) by mouth daily. 08/08/21     medroxyPROGESTERone (PROVERA) 10 MG tablet TAKE 1 TABLET BY MOUTH DAILY FOR 12 DAYS EVERY 3 MONTHS 05/29/20 05/29/21  Jannifer Hick, MD  meloxicam (MOBIC) 15 MG tablet Take 1 tablet (15 mg total) by mouth daily as needed for pain 01/22/21     meloxicam (MOBIC) 15 MG tablet Take 1 tablet (15 mg total) by mouth daily. 11/29/21     metoprolol succinate (TOPROL-XL) 50 MG 24 hr tablet TAKE 1 TABLET BY MOUTH ONCE DAILY. TAKE WITH OR IMMEDIATELY FOLLOWING A MEAL 11/01/15   Caren Macadam, MD  metoprolol succinate (TOPROL-XL) 50 MG 24 hr tablet Take 1 tablet (50 mg total) by mouth once daily 03/13/21     naproxen (NAPROSYN) 250 MG tablet Take 1 tablet (250 mg total) by mouth 2 (two) times daily with a meal. 06/21/16  [name], PA-C  naproxen (NAPROSYN) 500 MG tablet Take 1 tablet (500 mg total) by mouth 2 (two) times daily with a meal. 05/23/21     NON [facility]  Creams-#14 scar cream    [provider]  oxyCODONE (OXY IR/ROXICODONE) 5 MG immediate release tablet Take 1 tablet (5 mg total) by mouth every 4 (four) hours as needed for pain for up to 7 days 05/23/21     oxyCODONE-acetaminophen (PERCOCET/ROXICET) 5-325 MG tablet Take 1-2 tablets by mouth every 6 (six) hours as needed for pain 08/16/20     promethazine (PHENERGAN) 25 MG tablet Take 0.5-1 tablets (12.5-25 mg total) by mouth every 6 (six) hours as needed for nausea for up to 7 days 05/23/21     tiZANidine (ZANAFLEX) 4 MG tablet Take 1 tablet (4 mg total) by mouth 3 (three) times daily as needed. 05/23/21     traMADol (ULTRAM) 50 MG tablet Take 1 tablet (50 mg total) by mouth every 8  (eight) hours as needed for pain 07/25/20     traMADol (ULTRAM) 50 MG tablet Take 1 tablet (50 mg total) by mouth every 6 (six) hours as needed for pain. 11/06/21     traZODone (DESYREL) 50 MG tablet Take 1-2 tablets (50-100 mg total) by mouth at bedtime. 06/12/21     zolpidem (AMBIEN) 10 MG tablet TAKE 1 TABLET BY MOUTH ONCE DAILY AT BEDTIME AS NEEDED FOR INSOMNIA *LIMIT USE DUE TO DEPENDENCY POTENTIAL* 05/29/20 11/25/20  Jannifer Hick, MD  zolpidem (AMBIEN) 10 MG tablet Take 1 tablet (10 mg total) by mouth at bedtime as needed for insomnia. Limit use due to dependency potential. 04/27/21       Family History Family History  Problem Relation Age of Onset   Breast cancer Maternal Aunt    ALS Maternal Aunt    Diabetes Other    Hypertension Other    Heart attack Father 86   Colon cancer Neg Hx    Ovarian cancer Neg Hx    Uterine cancer Neg Hx    Cervical cancer Neg Hx    Other Neg Hx        pituitary disorder    Social History Social History   Tobacco Use   Smoking status: Never   Smokeless tobacco: Never  Substance Use Topics   Alcohol use: No   Drug use: No     Allergies   Oxycodone, Elemental sulfur, and Erythromycin   Review of Systems Review of Systems PER HPI  Physical Exam Triage Vital Signs ED Triage Vitals  Enc Vitals Group     BP 02/13/22 1059 (!) 163/80     Pulse Rate 02/13/22 1059 87     Resp 02/13/22 1059 18     Temp 02/13/22 1059 99.7 F (37.6 C)     Temp Source 02/13/22 1059 Oral     SpO2 02/13/22 1059 98 %     Weight --      Height --      Head Circumference --      Peak Flow --      Pain Score 02/13/22 1100 0     Pain Loc --      Pain Edu? --      Excl. in [place]? --    No data found.  Updated Vital Signs BP (!) 163/80 (BP Location: Right Arm)   Pulse 87   Temp 99.7 F (37.6 C) (Oral)   Resp 18   LMP 05/30/2016 (Exact  Date)   SpO2 98%   Visual Acuity Right Eye Distance:   Left Eye Distance:   Bilateral Distance:    Right Eye Near:    Left Eye Near:    Bilateral Near:     Physical Exam Vitals and nursing note reviewed.  Constitutional:      Appearance: Normal appearance.  HENT:     Head: Atraumatic.     Right Ear: Tympanic membrane and external ear normal.     Left Ear: Tympanic membrane and external ear normal.     Nose: Congestion present.     Mouth/Throat:     Mouth: Mucous membranes are moist.     Pharynx: Posterior oropharyngeal erythema present.  Eyes:     Extraocular Movements: Extraocular movements intact.     Conjunctiva/sclera: Conjunctivae normal.  Cardiovascular:     Rate and Rhythm: Normal rate and regular rhythm.     Heart sounds: Normal heart sounds.  Pulmonary:     Effort: Pulmonary effort is normal.     Breath sounds: Normal breath sounds. No wheezing or rales.  Musculoskeletal:        General: Normal range of motion.     Cervical back: Normal range of motion and neck supple.  Skin:    General: Skin is warm and dry.  Neurological:     Mental Status: She is alert and oriented to person, place, and time.  Psychiatric:        Mood and Affect: Mood normal.        Thought Content: Thought content normal.     UC Treatments / Results  Labs (all labs ordered are listed, but only abnormal results are displayed) Labs Reviewed  RESP PANEL BY RT-PCR (FLU A&B, COVID) [id number]    EKG   Radiology No results found.  Procedures Procedures (including critical care time)  Medications Ordered in UC Medications - No data to display  Initial Impression / Assessment and Plan / UC Course  I have reviewed the triage vital signs and the nursing notes.  Pertinent labs & imaging results that were available during my care of the patient were reviewed by me and considered in my medical decision making (see chart for details).     Patient with persistent coughing spells throughout visit, worse with deep breaths.  Suspect viral upper respiratory infection leading to bronchitis.  Respiratory panel  pending, treat with prednisone, Phenergan DM, supportive over-the-counter medications and home care.  Return for worsening symptoms.  Final Clinical Impressions(s) / UC Diagnoses   Final diagnoses:  Viral URI with cough   Discharge Instructions   None    ED Prescriptions     Medication Sig Dispense Auth. Provider   promethazine-dextromethorphan (PROMETHAZINE-DM) 6.25-15 MG/5ML syrup Take 5 mLs by mouth 4 (four) times daily as needed. 100 mL Volney American, PA-C   predniSONE (DELTASONE) 20 MG tablet Take 2 tablets (40 mg total) by mouth daily with breakfast. 10 tablet Volney American, [place]      PDMP not reviewed this encounter.   Volney American, [place] 02/13/22 1257

## 2022-02-18 ENCOUNTER — Other Ambulatory Visit: Payer: Self-pay | Admitting: Podiatry

## 2022-02-18 DIAGNOSIS — M7672 Peroneal tendinitis, left leg: Secondary | ICD-10-CM

## 2022-02-18 DIAGNOSIS — M79672 Pain in left foot: Secondary | ICD-10-CM

## 2022-02-18 DIAGNOSIS — M76829 Posterior tibial tendinitis, unspecified leg: Secondary | ICD-10-CM

## 2022-02-18 DIAGNOSIS — M7752 Other enthesopathy of left foot: Secondary | ICD-10-CM

## 2022-02-18 DIAGNOSIS — G8929 Other chronic pain: Secondary | ICD-10-CM

## 2022-03-26 ENCOUNTER — Other Ambulatory Visit (HOSPITAL_COMMUNITY): Payer: Self-pay

## 2022-03-28 ENCOUNTER — Other Ambulatory Visit (HOSPITAL_COMMUNITY): Payer: Self-pay

## 2022-03-28 MED ORDER — METOPROLOL SUCCINATE ER 50 MG PO TB24
50.0000 mg | ORAL_TABLET | Freq: Every day | ORAL | 1 refills | Status: DC
  Filled 2022-03-28: qty 90, 90d supply, fill #0
  Filled 2022-06-22: qty 90, 90d supply, fill #1

## 2022-06-03 ENCOUNTER — Ambulatory Visit: Payer: Commercial Managed Care - PPO | Admitting: Podiatry

## 2022-06-03 DIAGNOSIS — M7672 Peroneal tendinitis, left leg: Secondary | ICD-10-CM | POA: Diagnosis not present

## 2022-06-03 DIAGNOSIS — Q666 Other congenital valgus deformities of feet: Secondary | ICD-10-CM

## 2022-06-03 DIAGNOSIS — M76829 Posterior tibial tendinitis, unspecified leg: Secondary | ICD-10-CM | POA: Diagnosis not present

## 2022-06-03 NOTE — Progress Notes (Unsigned)
Subjective: Chief Complaint  Patient presents with   surgery consult    Left foot   52 year old female presents the office today with above concerns.  She states the pain in the outside aspect of the left foot is improved but she points more to the medial aspect ankle, foot where she gets discomfort.  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  No recent injury or changes otherwise.  Objective: AAO x3, NAD DP/PT pulses palpable bilaterally, CRT less than 3 seconds Decreased medial arch upon weightbearing.  It is tenderness although mild along the course the posterior tibial tendon.  Trace edema.  There is no erythema or warmth.  Flexor, extensor tendons appear to be intact.  MMT 5/5. No pain with calf compression, swelling, warmth, erythema  Assessment: Posterior tibial tendon dysfunction, flatfoot left side  Plan: -All treatment options discussed with the patient including all alternatives, risks, complications.  -Again reviewed the MRI with her.  She does have posterior tibial tendon dysfunction, flatfoot.  She has had longstanding history of issues which I think is ultimately to right more flatfoot.  We discussed the conservative as well as surgical options.  She will consider surgical intervention but for now is not good timing still to continue with conservative care with stretching, icing, shoes and good arch support. -Patient encouraged to call the office with any questions, concerns, change in symptoms.   Trula Slade DPM

## 2022-06-24 ENCOUNTER — Other Ambulatory Visit (HOSPITAL_COMMUNITY): Payer: Self-pay

## 2022-06-25 ENCOUNTER — Other Ambulatory Visit (HOSPITAL_COMMUNITY): Payer: Self-pay

## 2022-06-26 ENCOUNTER — Other Ambulatory Visit (HOSPITAL_COMMUNITY): Payer: Self-pay

## 2022-07-30 ENCOUNTER — Other Ambulatory Visit (HOSPITAL_COMMUNITY): Payer: Self-pay

## 2022-07-30 DIAGNOSIS — M25561 Pain in right knee: Secondary | ICD-10-CM | POA: Diagnosis not present

## 2022-08-20 ENCOUNTER — Other Ambulatory Visit (HOSPITAL_COMMUNITY): Payer: Self-pay

## 2022-08-20 MED ORDER — METOPROLOL SUCCINATE ER 50 MG PO TB24
50.0000 mg | ORAL_TABLET | Freq: Every day | ORAL | 0 refills | Status: DC
  Filled 2022-08-20 – 2022-10-09 (×4): qty 90, 90d supply, fill #0

## 2022-08-20 MED ORDER — LOSARTAN POTASSIUM 100 MG PO TABS
100.0000 mg | ORAL_TABLET | Freq: Every day | ORAL | 3 refills | Status: AC
Start: 2022-08-20 — End: ?
  Filled 2022-08-20: qty 90, 90d supply, fill #0
  Filled 2022-11-24: qty 90, 90d supply, fill #1
  Filled 2023-03-02: qty 90, 90d supply, fill #2

## 2022-08-23 ENCOUNTER — Other Ambulatory Visit (HOSPITAL_COMMUNITY): Payer: Self-pay

## 2022-08-27 DIAGNOSIS — M25561 Pain in right knee: Secondary | ICD-10-CM | POA: Diagnosis not present

## 2022-09-02 ENCOUNTER — Other Ambulatory Visit (HOSPITAL_COMMUNITY): Payer: Self-pay

## 2022-09-06 DIAGNOSIS — M25561 Pain in right knee: Secondary | ICD-10-CM | POA: Diagnosis not present

## 2022-09-12 DIAGNOSIS — M25561 Pain in right knee: Secondary | ICD-10-CM | POA: Diagnosis not present

## 2022-09-23 ENCOUNTER — Other Ambulatory Visit: Payer: Self-pay

## 2022-09-23 ENCOUNTER — Other Ambulatory Visit (HOSPITAL_COMMUNITY): Payer: Self-pay

## 2022-10-03 ENCOUNTER — Other Ambulatory Visit (HOSPITAL_COMMUNITY): Payer: Self-pay

## 2022-10-08 DIAGNOSIS — M25561 Pain in right knee: Secondary | ICD-10-CM | POA: Diagnosis not present

## 2022-10-09 ENCOUNTER — Other Ambulatory Visit (HOSPITAL_COMMUNITY): Payer: Self-pay

## 2022-10-11 ENCOUNTER — Other Ambulatory Visit: Payer: Self-pay | Admitting: Orthopedic Surgery

## 2022-11-07 NOTE — Progress Notes (Signed)
Anesthesia Review:  PCP: Cardiologist : Chest x-ray : EKG : Echo : Stress test: Cardiac Cath :  Activity level:  Sleep Study/ CPAP : Fasting Blood Sugar :      / Checks Blood Sugar -- times a day:   Blood Thinner/ Instructions /Last Dose: ASA / Instructions/ Last Dose :  

## 2022-11-12 ENCOUNTER — Encounter (HOSPITAL_COMMUNITY)
Admission: RE | Admit: 2022-11-12 | Discharge: 2022-11-12 | Disposition: A | Discharge status: Home / Self Care | Payer: Commercial Managed Care - PPO | Source: Ambulatory Visit | Attending: Family Medicine | Admitting: Family Medicine

## 2022-11-12 NOTE — Patient Instructions (Signed)
DUE TO COVID-19 ONLY TWO VISITORS  (aged 51 and older)  ARE ALLOWED TO COME WITH YOU AND STAY IN THE WAITING ROOM ONLY DURING PRE OP AND PROCEDURE.   **NO VISITORS ARE ALLOWED IN THE SHORT STAY AREA OR RECOVERY ROOM!!**  IF YOU WILL BE ADMITTED INTO THE HOSPITAL YOU ARE ALLOWED ONLY FOUR SUPPORT PEOPLE DURING VISITATION HOURS ONLY (7 AM -8PM)   The support person(s) must pass our screening, gel in and out, and wear a mask at all times, including in the patient's room. Patients must also wear a mask when staff or their support person are in the room. Visitors GUEST BADGE MUST BE WORN VISIBLY  One adult visitor may remain with you overnight and MUST be in the room by 8 P.M.     Your procedure is scheduled on: 11/18/22   Report to [facility] Main Entrance    Report to admitting at : 12:15 PM   Call this number if you have problems the morning of surgery [phone]   Do not eat food :After Midnight.   After Midnight you may have the following liquids until : 11:30 AM DAY OF SURGERY  Water Black Coffee (sugar ok, NO MILK/CREAM OR CREAMERS)  Tea (sugar ok, NO MILK/CREAM OR CREAMERS) regular and decaf                             Plain Jell-O (NO RED)                                           Fruit ices (not with fruit pulp, NO RED)                                     Popsicles (NO RED)                                                                  Juice: apple, WHITE grape, WHITE cranberry Sports drinks like Gatorade (NO RED)   The day of surgery:  Drink ONE (1) Pre-Surgery Clear Ensure at: 11:30 AM the morning of surgery. Drink in one sitting. Do not sip.  This drink was given to you during your hospital  pre-op appointment visit. Nothing else to drink after completing the  Pre-Surgery Clear Ensure or G2.          If you have questions, please contact your surgeon's office.  FOLLOW ANY ADDITIONAL PRE OP INSTRUCTIONS YOU RECEIVED FROM YOUR SURGEON'S OFFICE!!!   Oral  Hygiene is also important to reduce your risk of infection.                                    Remember - BRUSH YOUR TEETH THE MORNING OF SURGERY WITH YOUR REGULAR TOOTHPASTE  DENTURES WILL BE REMOVED PRIOR TO SURGERY PLEASE DO NOT APPLY "Poly grip" OR ADHESIVES!!!   Do NOT smoke after Midnight   Take these medicines the morning of surgery with  A SIP OF WATER: N/A                   You may not have any metal on your body including hair pins, jewelry, and body piercing             Do not wear make-up, lotions, powders, perfumes/cologne, or deodorant  Do not wear nail polish including gel and S&S, artificial/acrylic nails, or any other type of covering on natural nails including finger and toenails. If you have artificial nails, gel coating, etc. that needs to be removed by a nail salon please have this removed prior to surgery or surgery may need to be canceled/ delayed if the surgeon/ anesthesia feels like they are unable to be safely monitored.   Do not shave  48 hours prior to surgery.    Do not bring valuables to the hospital. [facility] IS NOT             RESPONSIBLE   FOR VALUABLES.   Contacts, glasses, or bridgework may not be worn into surgery.   Bring small overnight bag day of surgery.   DO NOT BRING YOUR HOME MEDICATIONS TO THE HOSPITAL. PHARMACY WILL DISPENSE MEDICATIONS LISTED ON YOUR MEDICATION LIST TO YOU DURING YOUR ADMISSION IN THE HOSPITAL!    Patients discharged on the day of surgery will not be allowed to drive home.  Someone NEEDS to stay with you for the first 24 hours after anesthesia.   Special Instructions: Bring a copy of your healthcare power of attorney and living will documents         the day of surgery if you haven't scanned them before.              Please read over the following fact sheets you were given: IF YOU HAVE QUESTIONS ABOUT YOUR PRE-OP INSTRUCTIONS PLEASE CALL [phone]    [facility] - Preparing for Surgery Before surgery, you can play  an important role.  Because skin is not sterile, your skin needs to be as free of germs as possible.  You can reduce the number of germs on your skin by washing with CHG (chlorahexidine gluconate) soap before surgery.  CHG is an antiseptic cleaner which kills germs and bonds with the skin to continue killing germs even after washing. Please DO NOT use if you have an allergy to CHG or antibacterial soaps.  If your skin becomes reddened/irritated stop using the CHG and inform your nurse when you arrive at Short Stay. Do not shave (including legs and underarms) for at least 48 hours prior to the first CHG shower.  You may shave your face/neck. Please follow these instructions carefully:  1.  Shower with CHG Soap the night before surgery and the  morning of Surgery.  2.  If you choose to wash your hair, wash your hair first as usual with your  normal  shampoo.  3.  After you shampoo, rinse your hair and body thoroughly to remove the  shampoo.                           4.  Use CHG as you would any other liquid soap.  You can apply chg directly  to the skin and wash                       Gently with a scrungie or clean washcloth.  5.  Apply the CHG  Soap to your body ONLY FROM THE NECK DOWN.   Do not use on face/ open                           Wound or open sores. Avoid contact with eyes, ears mouth and genitals (private parts).                       Wash face,  Genitals (private parts) with your normal soap.             6.  Wash thoroughly, paying special attention to the area where your surgery  will be performed.  7.  Thoroughly rinse your body with warm water from the neck down.  8.  DO NOT shower/wash with your normal soap after using and rinsing off  the CHG Soap.                9.  Pat yourself dry with a clean towel.            10.  Wear clean pajamas.            11.  Place clean sheets on your bed the night of your first shower and do not  sleep with pets. Day of Surgery : Do not apply any  lotions/deodorants the morning of surgery.  Please wear clean clothes to the hospital/surgery center.  FAILURE TO FOLLOW THESE INSTRUCTIONS MAY RESULT IN THE CANCELLATION OF YOUR SURGERY PATIENT SIGNATURE_________________________________  NURSE SIGNATURE__________________________________  ________________________________________________________________________

## 2022-11-13 ENCOUNTER — Encounter (HOSPITAL_COMMUNITY): Payer: Self-pay

## 2022-11-13 ENCOUNTER — Encounter (HOSPITAL_COMMUNITY)
Admission: RE | Admit: 2022-11-13 | Discharge: 2022-11-13 | Disposition: A | Discharge status: Home / Self Care | Payer: Commercial Managed Care - PPO | Source: Ambulatory Visit | Attending: Orthopedic Surgery | Admitting: Orthopedic Surgery

## 2022-11-13 ENCOUNTER — Other Ambulatory Visit: Payer: Self-pay

## 2022-11-13 ENCOUNTER — Inpatient Hospital Stay (HOSPITAL_COMMUNITY): Admission: RE | Admit: 2022-11-13 | Payer: Commercial Managed Care - PPO | Source: Ambulatory Visit

## 2022-11-13 VITALS — BP 142/79 | HR 62 | Temp 98.5°F | Ht 66.0 in | Wt 147.0 lb

## 2022-11-13 DIAGNOSIS — Z01818 Encounter for other preprocedural examination: Secondary | ICD-10-CM | POA: Diagnosis present

## 2022-11-13 DIAGNOSIS — I10 Essential (primary) hypertension: Secondary | ICD-10-CM | POA: Insufficient documentation

## 2022-11-13 DIAGNOSIS — Z01810 Encounter for preprocedural cardiovascular examination: Secondary | ICD-10-CM | POA: Insufficient documentation

## 2022-11-13 DIAGNOSIS — R9431 Abnormal electrocardiogram [ECG] [EKG]: Secondary | ICD-10-CM | POA: Insufficient documentation

## 2022-11-13 DIAGNOSIS — M25861 Other specified joint disorders, right knee: Secondary | ICD-10-CM | POA: Insufficient documentation

## 2022-11-13 DIAGNOSIS — M797 Fibromyalgia: Secondary | ICD-10-CM | POA: Insufficient documentation

## 2022-11-13 DIAGNOSIS — Z01812 Encounter for preprocedural laboratory examination: Secondary | ICD-10-CM | POA: Diagnosis not present

## 2022-11-13 HISTORY — DX: Other complications of anesthesia, initial encounter: T88.59XA

## 2022-11-13 HISTORY — DX: Other specified postprocedural states: Z98.890

## 2022-11-13 HISTORY — DX: Cardiac murmur, unspecified: R01.1

## 2022-11-13 LAB — BASIC METABOLIC PANEL
Anion gap: 10 (ref 5–15)
BUN: 11 mg/dL (ref 6–20)
CO2: 25 mmol/L (ref 22–32)
Calcium: 9 mg/dL (ref 8.9–10.3)
Chloride: 104 mmol/L (ref 98–111)
Creatinine, Ser: 1.05 mg/dL — ABNORMAL HIGH (ref 0.44–1.00)
GFR, Estimated: >60 mL/min (ref >60)
Glucose, Bld: 74 mg/dL (ref 70–99)
Potassium: 4 mmol/L (ref 3.5–5.1)
Sodium: 139 mmol/L (ref 135–145)

## 2022-11-13 LAB — CBC
HCT: 41 % (ref 36.0–46.0)
Hemoglobin: 12.6 g/dL (ref 12.0–15.0)
MCH: 27.1 pg (ref 26.0–34.0)
MCHC: 30.7 g/dL (ref 30.0–36.0)
MCV: 88.2 fL (ref 80.0–100.0)
Platelets: 194 10*3/uL (ref 150–400)
RBC: 4.65 MIL/uL (ref 3.87–5.11)
RDW: 13.1 % (ref 11.5–15.5)
WBC: 4.7 10*3/uL (ref 4.0–10.5)
nRBC: 0 % (ref 0.0–0.2)

## 2022-11-13 NOTE — Progress Notes (Signed)
For Short Stay: COVID SWAB appointment date:  Bowel Prep reminder:   For Anesthesia: PCP - Dr. Erling Conte. Cardiologist - N/A  Chest x-ray -  EKG - 11/13/22 Stress Test -  ECHO -  Cardiac Cath -  Pacemaker/ICD device last checked: Pacemaker orders received: Device Rep notified:  Spinal Cord Stimulator:  Sleep Study - N/A CPAP -   Fasting Blood Sugar - N/A Checks Blood Sugar _____ times a day Date and result of last Hgb A1c-  Last dose of GLP1 agonist- N/A GLP1 instructions:   Last dose of SGLT-2 inhibitors- N/A SGLT-2 instructions:   Blood Thinner Instructions: N/A Aspirin Instructions: Last Dose:  Activity level: Can go up a flight of stairs and activities of daily living without stopping and without chest pain and/or shortness of breath   Able to exercise without chest pain and/or shortness of breath  Anesthesia review: Hx: HTN,Heart murmur.  Patient denies shortness of breath, fever, cough and chest pain at PAT appointment   Patient verbalized understanding of instructions that were given to them at the PAT appointment. Patient was also instructed that they will need to review over the PAT instructions again at home before surgery.

## 2022-11-13 NOTE — Progress Notes (Signed)
Choose an anesthesia record to view details        DISCUSSION: Virginia Hawkins is a 52 yo female who presents to PAT prior to right knee arthroscopy. PMH significant for HTN, hx of heart murmur, fibromyalgia  Prior complications from anesthesia includes PONV.  Patient states that she has hx of heart murmur since she was a child. She has never had any symptoms from this. She states some providers have mentioned that she has it and some have not. Auscultated faint 1/6 murmur in RUSB.  Discussed with Dr. Charlynn Grimes. Due to good functional status, being asymptomatic, and low risk surgery ok to proceed without further testing.   VS: BP (!) 142/79   Pulse 62   Temp 36.9 C (Oral)   Ht 5\' 6"  (1.676 m)   Wt 66.7 kg   LMP 05/30/2016 (Exact Date)   SpO2 100%   BMI 23.73 kg/m   PROVIDERS: Danae Chen, MD   LABS: Labs reviewed: Acceptable for surgery. (all labs ordered are listed, but only abnormal results are displayed)  Labs Reviewed  BASIC METABOLIC PANEL - Abnormal; Notable for the following components:      Result Value   Creatinine, Ser 1.05 (*)    All other components within normal limits  CBC     IMAGES:   EKG:   Sinus bradycardia, rate 58 RBBB Similar to prior (in care everywhere 10/03/2014)  CV:  Past Medical History:  Diagnosis Date   Complication of anesthesia    Fibromyalgia    Heart murmur    Hypertension    Ovarian cyst    PONV (postoperative nausea and vomiting)    Vaginal Pap smear, abnormal     Past Surgical History:  Procedure Laterality Date   ARTHROSCOPY WITH ANTERIOR CRUCIATE LIGAMENT (ACL) REPAIR WITH ANTERIOR TIBILIAS GRAFT Left    CYST EXCISION     FOOT SURGERY Left 2020   for plantar fasciitis   SALPINGECTOMY      MEDICATIONS:  losartan (COZAAR) 100 MG tablet   metoprolol succinate (TOPROL-XL) 50 MG 24 hr tablet   No current facility-administered medications for this encounter.   Marcille Blanco [facility] Short  Stay/Anesthesiology [facility] Phone [phone] 11/13/2022 3:59 PM

## 2022-11-13 NOTE — Anesthesia Preprocedure Evaluation (Addendum)
Anesthesia Evaluation  Patient identified by MRN, date of birth, ID band Patient awake    Reviewed: Allergy & Precautions, NPO status , Patient's Chart, lab work & pertinent test results, reviewed documented beta blocker date and time   History of Anesthesia Complications (+) PONV and history of anesthetic complications  Airway Mallampati: I       Dental no notable dental hx.    Pulmonary neg pulmonary ROS   Pulmonary exam normal        Cardiovascular hypertension, Pt. on medications and Pt. on home beta blockers Normal cardiovascular exam     Neuro/Psych  Neuromuscular disease  negative psych ROS   GI/Hepatic negative GI ROS, Neg liver ROS,,,  Endo/Other  negative endocrine ROS    Renal/GU   negative genitourinary   Musculoskeletal  (+)  Fibromyalgia -  Abdominal Normal abdominal exam  (+)   Peds  Hematology negative hematology ROS (+) Lab Results      Component                Value               Date                      WBC                      4.7                 11/13/2022                HGB                      12.6                11/13/2022                HCT                      41.0                11/13/2022                MCV                      88.2                11/13/2022                PLT                      194                 11/13/2022              Anesthesia Other Findings   Reproductive/Obstetrics                             Anesthesia Physical Anesthesia Plan  ASA: 2  Anesthesia Plan: General   Post-op Pain Management: Ketamine IV* and Tylenol PO (pre-op)*   Induction: Intravenous  PONV Risk Score and Plan: Treatment may vary due to age or medical condition, Ondansetron, Dexamethasone, Midazolam and Scopolamine patch - Pre-op  Airway Management Planned: LMA  Additional Equipment: None  Intra-op Plan:   Post-operative Plan: Extubation in  OR  Informed Consent: I have reviewed the patients  History and Physical, chart, labs and discussed the procedure including the risks, benefits and alternatives for the proposed anesthesia with the patient or authorized representative who has indicated his/her understanding and acceptance.     Dental advisory given  Plan Discussed with: CRNA  Anesthesia Plan Comments: (See PAT note from 8/21 by K Gekas PA-C )        Anesthesia Quick Evaluation

## 2022-11-18 ENCOUNTER — Encounter (HOSPITAL_COMMUNITY)
Admission: RE | Disposition: A | Discharge status: Home / Self Care | Payer: Self-pay | Source: Home / Self Care | Attending: Orthopedic Surgery

## 2022-11-18 ENCOUNTER — Ambulatory Visit (HOSPITAL_COMMUNITY)
Admission: RE | Admit: 2022-11-18 | Discharge: 2022-11-18 | Disposition: A | Discharge status: Home / Self Care | Payer: Commercial Managed Care - PPO | Attending: Orthopedic Surgery | Admitting: Orthopedic Surgery

## 2022-11-18 ENCOUNTER — Ambulatory Visit (HOSPITAL_BASED_OUTPATIENT_CLINIC_OR_DEPARTMENT_OTHER): Payer: Commercial Managed Care - PPO | Admitting: Anesthesiology

## 2022-11-18 ENCOUNTER — Other Ambulatory Visit: Payer: Self-pay

## 2022-11-18 ENCOUNTER — Ambulatory Visit (HOSPITAL_COMMUNITY): Payer: Commercial Managed Care - PPO | Admitting: Anesthesiology

## 2022-11-18 ENCOUNTER — Encounter (HOSPITAL_COMMUNITY): Payer: Self-pay | Admitting: Orthopedic Surgery

## 2022-11-18 ENCOUNTER — Other Ambulatory Visit (HOSPITAL_COMMUNITY): Payer: Self-pay

## 2022-11-18 DIAGNOSIS — M2241 Chondromalacia patellae, right knee: Secondary | ICD-10-CM | POA: Diagnosis not present

## 2022-11-18 DIAGNOSIS — M797 Fibromyalgia: Secondary | ICD-10-CM | POA: Diagnosis not present

## 2022-11-18 DIAGNOSIS — M228X1 Other disorders of patella, right knee: Secondary | ICD-10-CM | POA: Diagnosis present

## 2022-11-18 DIAGNOSIS — I10 Essential (primary) hypertension: Secondary | ICD-10-CM | POA: Insufficient documentation

## 2022-11-18 DIAGNOSIS — M25861 Other specified joint disorders, right knee: Secondary | ICD-10-CM | POA: Diagnosis not present

## 2022-11-18 DIAGNOSIS — M6751 Plica syndrome, right knee: Secondary | ICD-10-CM | POA: Diagnosis not present

## 2022-11-18 HISTORY — PX: KNEE ARTHROSCOPY: SHX127

## 2022-11-18 SURGERY — ARTHROSCOPY, KNEE
Anesthesia: General | Site: Knee | Laterality: Right

## 2022-11-18 MED ORDER — MIDAZOLAM HCL 2 MG/2ML IJ SOLN
INTRAMUSCULAR | Status: AC
  Filled 2022-11-18: qty 2

## 2022-11-18 MED ORDER — ORAL CARE MOUTH RINSE: 15.0000 mL | Freq: Once | OROMUCOSAL | Status: AC

## 2022-11-18 MED ORDER — ONDANSETRON HCL 4 MG/2ML IJ SOLN
INTRAMUSCULAR | Status: DC | PRN
  Administered 2022-11-18: 4 mg via INTRAVENOUS

## 2022-11-18 MED ORDER — ACETAMINOPHEN 500 MG PO TABS
1000.0000 mg | ORAL_TABLET | Freq: Once | ORAL | Status: AC
  Administered 2022-11-18: 1000 mg via ORAL
  Filled 2022-11-18: qty 2

## 2022-11-18 MED ORDER — SCOPOLAMINE 1 MG/3DAYS TD PT72
1.0000 | MEDICATED_PATCH | TRANSDERMAL | Status: DC
  Administered 2022-11-18: 1.5 mg via TRANSDERMAL
  Filled 2022-11-18: qty 1

## 2022-11-18 MED ORDER — LACTATED RINGERS IV SOLN: INTRAVENOUS | Status: DC

## 2022-11-18 MED ORDER — FENTANYL CITRATE (PF) 100 MCG/2ML IJ SOLN
INTRAMUSCULAR | Status: AC
  Filled 2022-11-18: qty 2

## 2022-11-18 MED ORDER — HYDROMORPHONE HCL 1 MG/ML IJ SOLN
INTRAMUSCULAR | Status: AC
  Filled 2022-11-18: qty 1

## 2022-11-18 MED ORDER — TRAMADOL HCL 50 MG PO TABS
50.0000 mg | ORAL_TABLET | Freq: Four times a day (QID) | ORAL | 0 refills | Status: AC | PRN
Start: 2022-11-18 — End: 2023-11-18
  Filled 2022-11-18: qty 30, 4d supply, fill #0

## 2022-11-18 MED ORDER — ONDANSETRON HCL 4 MG/2ML IJ SOLN: 4.0000 mg | Freq: Once | INTRAMUSCULAR | Status: DC | PRN

## 2022-11-18 MED ORDER — LIDOCAINE 2% (20 MG/ML) 5 ML SYRINGE
INTRAMUSCULAR | Status: DC | PRN
  Administered 2022-11-18: 100 mg via INTRAVENOUS

## 2022-11-18 MED ORDER — DEXAMETHASONE SODIUM PHOSPHATE 10 MG/ML IJ SOLN
INTRAMUSCULAR | Status: AC
  Filled 2022-11-18: qty 1

## 2022-11-18 MED ORDER — PHENYLEPHRINE 80 MCG/ML (10ML) SYRINGE FOR IV PUSH (FOR BLOOD PRESSURE SUPPORT)
PREFILLED_SYRINGE | INTRAVENOUS | Status: DC | PRN
  Administered 2022-11-18: 80 ug via INTRAVENOUS

## 2022-11-18 MED ORDER — MIDAZOLAM HCL 2 MG/2ML IJ SOLN
INTRAMUSCULAR | Status: DC | PRN
  Administered 2022-11-18: 2 mg via INTRAVENOUS

## 2022-11-18 MED ORDER — EPINEPHRINE PF 1 MG/ML IJ SOLN
INTRAMUSCULAR | Status: DC | PRN
  Administered 2022-11-18: .3 mg

## 2022-11-18 MED ORDER — EPINEPHRINE PF 1 MG/ML IJ SOLN
INTRAMUSCULAR | Status: AC
  Filled 2022-11-18: qty 1

## 2022-11-18 MED ORDER — OXYCODONE HCL 5 MG/5ML PO SOLN: 5.0000 mg | Freq: Once | ORAL | Status: AC | PRN

## 2022-11-18 MED ORDER — FENTANYL CITRATE (PF) 100 MCG/2ML IJ SOLN
INTRAMUSCULAR | Status: DC | PRN
  Administered 2022-11-18: 100 ug via INTRAVENOUS

## 2022-11-18 MED ORDER — KETAMINE HCL 10 MG/ML IJ SOLN
INTRAMUSCULAR | Status: DC | PRN
  Administered 2022-11-18: 10 mg via INTRAVENOUS

## 2022-11-18 MED ORDER — DEXAMETHASONE SODIUM PHOSPHATE 10 MG/ML IJ SOLN
INTRAMUSCULAR | Status: DC | PRN
  Administered 2022-11-18: 4 mg via INTRAVENOUS

## 2022-11-18 MED ORDER — BUPIVACAINE HCL (PF) 0.5 % IJ SOLN
INTRAMUSCULAR | Status: DC | PRN
  Administered 2022-11-18: 30 mL

## 2022-11-18 MED ORDER — DIPHENHYDRAMINE HCL 50 MG/ML IJ SOLN
INTRAMUSCULAR | Status: AC
  Filled 2022-11-18: qty 1

## 2022-11-18 MED ORDER — OXYCODONE HCL 5 MG PO TABS
ORAL_TABLET | ORAL | Status: AC
  Filled 2022-11-18: qty 1

## 2022-11-18 MED ORDER — KETOROLAC TROMETHAMINE 30 MG/ML IJ SOLN: 30.0000 mg | Freq: Once | INTRAMUSCULAR | Status: DC | PRN

## 2022-11-18 MED ORDER — HYDROMORPHONE HCL 1 MG/ML IJ SOLN
0.2500 mg | INTRAMUSCULAR | Status: DC | PRN
  Administered 2022-11-18: 0.25 mg via INTRAVENOUS
  Administered 2022-11-18: 0.5 mg via INTRAVENOUS
  Administered 2022-11-18: 0.25 mg via INTRAVENOUS

## 2022-11-18 MED ORDER — KETAMINE HCL 50 MG/5ML IJ SOSY
PREFILLED_SYRINGE | INTRAMUSCULAR | Status: AC
  Filled 2022-11-18: qty 5

## 2022-11-18 MED ORDER — ONDANSETRON HCL 4 MG/2ML IJ SOLN
INTRAMUSCULAR | Status: AC
  Filled 2022-11-18: qty 2

## 2022-11-18 MED ORDER — BUPIVACAINE HCL (PF) 0.5 % IJ SOLN
INTRAMUSCULAR | Status: AC
  Filled 2022-11-18: qty 30

## 2022-11-18 MED ORDER — PHENYLEPHRINE 80 MCG/ML (10ML) SYRINGE FOR IV PUSH (FOR BLOOD PRESSURE SUPPORT)
PREFILLED_SYRINGE | INTRAVENOUS | Status: AC
  Filled 2022-11-18: qty 20

## 2022-11-18 MED ORDER — DIPHENHYDRAMINE HCL 50 MG/ML IJ SOLN
12.5000 mg | INTRAMUSCULAR | Status: AC | PRN
  Administered 2022-11-18 (×2): 12.5 mg via INTRAVENOUS

## 2022-11-18 MED ORDER — CEFAZOLIN SODIUM-DEXTROSE 2-4 GM/100ML-% IV SOLN
2.0000 g | INTRAVENOUS | Status: AC
  Administered 2022-11-18: 2 g via INTRAVENOUS
  Filled 2022-11-18: qty 100

## 2022-11-18 MED ORDER — PROPOFOL 10 MG/ML IV BOLUS
INTRAVENOUS | Status: AC
  Filled 2022-11-18: qty 20

## 2022-11-18 MED ORDER — EPHEDRINE 5 MG/ML INJ
INTRAVENOUS | Status: AC
  Filled 2022-11-18: qty 5

## 2022-11-18 MED ORDER — LACTATED RINGERS IV SOLN: INTRAVENOUS | Status: DC | PRN

## 2022-11-18 MED ORDER — LIDOCAINE HCL (PF) 2 % IJ SOLN
INTRAMUSCULAR | Status: AC
  Filled 2022-11-18: qty 5

## 2022-11-18 MED ORDER — CHLORHEXIDINE GLUCONATE 0.12 % MT SOLN
15.0000 mL | Freq: Once | OROMUCOSAL | Status: AC
  Administered 2022-11-18: 15 mL via OROMUCOSAL

## 2022-11-18 MED ORDER — PROPOFOL 10 MG/ML IV BOLUS
INTRAVENOUS | Status: DC | PRN
  Administered 2022-11-18: 200 mg via INTRAVENOUS

## 2022-11-18 MED ORDER — SODIUM CHLORIDE 0.9 % IR SOLN
Status: DC | PRN
  Administered 2022-11-18: 1000 mL
  Administered 2022-11-18: 3000 mL

## 2022-11-18 MED ORDER — OXYCODONE HCL 5 MG PO TABS
5.0000 mg | ORAL_TABLET | Freq: Once | ORAL | Status: AC | PRN
  Administered 2022-11-18: 5 mg via ORAL

## 2022-11-18 SURGICAL SUPPLY — 36 items
BAG COUNTER SPONGE SURGICOUNT (BAG) IMPLANT
BAG SPNG CNTER NS LX DISP (BAG)
BLADE EXCALIBUR 4.0X13 (MISCELLANEOUS) IMPLANT
BNDG CMPR 6 X 5 YARDS HK CLSR (GAUZE/BANDAGES/DRESSINGS) ×1
BNDG CMPR MED 15X6 ELC VLCR LF (GAUZE/BANDAGES/DRESSINGS) ×2
BNDG ELASTIC 6INX 5YD STR LF (GAUZE/BANDAGES/DRESSINGS) ×1 IMPLANT
BNDG ELASTIC 6X15 VLCR STRL LF (GAUZE/BANDAGES/DRESSINGS) IMPLANT
BOOTIES KNEE HIGH SLOAN (MISCELLANEOUS) ×2 IMPLANT
DISSECTOR 4.0MM X 13CM (MISCELLANEOUS) IMPLANT
DRAPE U-SHAPE 47X51 STRL (DRAPES) ×1 IMPLANT
DRSG EMULSION OIL 3X3 NADH (GAUZE/BANDAGES/DRESSINGS) ×1 IMPLANT
DURAPREP 26ML APPLICATOR (WOUND CARE) ×1 IMPLANT
FILTER STRAW (MISCELLANEOUS) ×1 IMPLANT
GAUZE 4X4 16PLY ~~LOC~~+RFID DBL (SPONGE) ×1 IMPLANT
GAUZE PAD ABD 8X10 STRL (GAUZE/BANDAGES/DRESSINGS) ×2 IMPLANT
GAUZE SPONGE 4X4 12PLY STRL (GAUZE/BANDAGES/DRESSINGS) ×1 IMPLANT
GLOVE BIOGEL PI IND STRL 8 (GLOVE) ×2 IMPLANT
GLOVE ECLIPSE 7.5 STRL STRAW (GLOVE) ×2 IMPLANT
GOWN STRL REUS W/ TWL XL LVL3 (GOWN DISPOSABLE) ×2 IMPLANT
GOWN STRL REUS W/TWL XL LVL3 (GOWN DISPOSABLE) ×2
KIT BASIN OR (CUSTOM PROCEDURE TRAY) ×1 IMPLANT
KIT TURNOVER KIT A (KITS) IMPLANT
MANIFOLD NEPTUNE II (INSTRUMENTS) ×1 IMPLANT
NDL HYPO 22X1.5 SAFETY MO (MISCELLANEOUS) ×1 IMPLANT
NDL SAFETY ECLIP 18X1.5 (MISCELLANEOUS) ×1 IMPLANT
NEEDLE HYPO 22X1.5 SAFETY MO (MISCELLANEOUS) ×1 IMPLANT
PACK ARTHROSCOPY WL (CUSTOM PROCEDURE TRAY) ×1 IMPLANT
PAD MASON LEG HOLDER (PIN) IMPLANT
PADDING CAST COTTON 6X4 STRL (CAST SUPPLIES) ×1 IMPLANT
PENCIL SMOKE EVACUATOR (MISCELLANEOUS) IMPLANT
PORT APPOLLO RF 90DEGREE MULTI (SURGICAL WAND) IMPLANT
SUT ETHILON 4 0 PS 2 18 (SUTURE) IMPLANT
SYR 3ML LL SCALE MARK (SYRINGE) ×1 IMPLANT
TOWEL OR 17X26 10 PK STRL BLUE (TOWEL DISPOSABLE) ×1 IMPLANT
TUBING ARTHROSCOPY IRRIG 16FT (MISCELLANEOUS) ×1 IMPLANT
WRAP KNEE MAXI GEL POST OP (GAUZE/BANDAGES/DRESSINGS) ×1 IMPLANT

## 2022-11-18 NOTE — H&P (Signed)
A pre op hand p   Chief Complaint: Right knee pain  HPI: Virginia Hawkins is a 52 y.o. female who presents for evaluation of right knee pain. It has been present for greater than 3 months and has been worsening. She has failed conservative measures. Pain is rated as moderate.  Past Medical History:  Diagnosis Date   Complication of anesthesia    Fibromyalgia    Heart murmur    Hypertension    Ovarian cyst    PONV (postoperative nausea and vomiting)    Vaginal Pap smear, abnormal    Past Surgical History:  Procedure Laterality Date   ARTHROSCOPY WITH ANTERIOR CRUCIATE LIGAMENT (ACL) REPAIR WITH ANTERIOR TIBILIAS GRAFT Left    CYST EXCISION     FOOT SURGERY Left 2020   for plantar fasciitis   SALPINGECTOMY     Social History   Socioeconomic History   Marital status: Married    Spouse name: Not on file   Number of children: 2   Years of education: Not on file   Highest education level: Some college, no degree  Occupational History    Comment: Lab  Tobacco Use   Smoking status: Never   Smokeless tobacco: Never  Vaping Use   Vaping status: Never Used  Substance and Sexual Activity   Alcohol use: Yes    Comment: occas.   Drug use: No   Sexual activity: Yes    Birth control/protection: Pill  Other Topics Concern   Not on file  Social History Narrative   Lives with family   Caffeine- not much   Social Determinants of Health   Financial Resource Strain: Not on file  Food Insecurity: Not on file  Transportation Needs: Not on file  Physical Activity: Not on file  Stress: Not on file  Social Connections: Not on file   Family History  Problem Relation Age of Onset   Breast cancer Maternal Aunt    ALS Maternal Aunt    Diabetes Other    Hypertension Other    Heart attack Father 87   Colon cancer Neg Hx    Ovarian cancer Neg Hx    Uterine cancer Neg Hx    Cervical cancer Neg Hx    Other Neg Hx        pituitary disorder   Allergies  Allergen Reactions    Hydrocodone Itching   Oxycodone Itching and Other (See Comments)    hyper   Elemental Sulfur Rash   Erythromycin Rash   Prior to Admission medications   Medication Sig Start Date End Date Taking? Authorizing Provider  losartan (COZAAR) 100 MG tablet Take 1 tablet (100 mg total) by mouth daily. 08/20/22  Yes   metoprolol succinate (TOPROL-XL) 50 MG 24 hr tablet TAKE 1 TABLET BY MOUTH ONCE DAILY. TAKE WITH OR IMMEDIATELY FOLLOWING A MEAL Patient taking differently: Take 50 mg by mouth at bedtime. 11/01/15  Yes Federico Flake, MD     Positive ROS: None  All other systems have been reviewed and were otherwise negative with the exception of those mentioned in the HPI and as above.  Physical Exam: There were no vitals filed for this visit.  General: Alert, no acute distress Cardiovascular: No pedal edema Respiratory: No cyanosis, no use of accessory musculature GI: No organomegaly, abdomen is soft and non-tender Skin: No lesions in the area of chief complaint Neurologic: Sensation intact distally Psychiatric: Patient is competent for consent with normal mood and affect Lymphatic: No axillary or  cervical lymphadenopathy  MUSCULOSKELETAL: Right knee: Positive elevation and grind.  No instability.  Pain through range of motion.  No erythema or warmth.  Trace effusion.  Assessment/Plan: RIGHT KNEE PAIN, PATELLA MALTRACKING Plan for Procedure(s): RIGHT KNEE ARTHROSCOPY with lateral retinacular release  The risks benefits and alternatives were discussed with the patient including but not limited to the risks of nonoperative treatment, versus surgical intervention including infection, bleeding, nerve injury, malunion, nonunion, hardware prominence, hardware failure, need for hardware removal, blood clots, cardiopulmonary complications, morbidity, mortality, among others, and they were willing to proceed.  Predicted outcome is good, although there will be at least a six to nine month  expected recovery.  Harvie Junior, MD 11/18/2022 9:36 AM

## 2022-11-18 NOTE — Brief Op Note (Signed)
11/18/2022  4:52 PM  PATIENT:  Virginia Hawkins  52 y.o. female  PRE-OPERATIVE DIAGNOSIS:  RIGHT KNEE PAIN, PATELLA MALTRACKING  POST-OPERATIVE DIAGNOSIS:  RIGHT debridment chondromalacie right patella  PROCEDURE:  Procedure(s): RIGHT KNEE ARTHROSCOPY WITH CHONDROPLASTY PATELLA WITH LATERAL RETINACULAR RELEASE. MEDIAL PLICA INISION (Right)  SURGEON:  Surgeons and Role:    Jodi Geralds, MD - Primary  PHYSICIAN ASSISTANT:   ASSISTANTS: Gus Puma Pac   ANESTHESIA:   general  EBL:  20 mL   BLOOD ADMINISTERED:none  DRAINS: none   LOCAL MEDICATIONS USED:  MARCAINE     SPECIMEN:  No Specimen  DISPOSITION OF SPECIMEN:  N/A  COUNTS:  YES  TOURNIQUET:  * No tourniquets in log *  DICTATION: .Other Dictation: Dictation Number [id number]  PLAN OF CARE: Discharge to home after PACU  PATIENT DISPOSITION:  PACU - hemodynamically stable.   Delay start of Pharmacological VTE agent (>24hrs) due to surgical blood loss or risk of bleeding: no

## 2022-11-18 NOTE — Anesthesia Procedure Notes (Signed)
Procedure Name: LMA Insertion Date/Time: 11/18/2022 1:02 PM  Performed by: Sindy Guadeloupe, CRNAPre-anesthesia Checklist: Patient identified, Emergency Drugs available, Suction available, Patient being monitored and Timeout performed Patient Re-evaluated:Patient Re-evaluated prior to induction Oxygen Delivery Method: Circle system utilized Preoxygenation: Pre-oxygenation with 100% oxygen Induction Type: IV induction Ventilation: Mask ventilation without difficulty LMA: LMA inserted LMA Size: 4.0 Number of attempts: 1 Tube secured with: Tape Dental Injury: Teeth and Oropharynx as per pre-operative assessment

## 2022-11-18 NOTE — Op Note (Signed)
NAMELEXANI, HARER MEDICAL RECORD NO: [id number] ACCOUNT NO: [id number] DATE OF BIRTH: Aug 01, 1970 FACILITY: [facility] LOCATION: [facility] PHYSICIAN: Harvie Junior, MD  Operative Report   DATE OF PROCEDURE: 11/18/2022  PREOPERATIVE DIAGNOSES: 1.  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 2.  Tight lateral retinaculum right knee.  POSTOPERATIVE DIAGNOSES: 1.  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 2.  Tight lateral retinaculum right knee. 3.  Plica syndrome, medial and lateral right knee.  PROCEDURE: 1.  Right knee arthroscopy with abrasion arthroplasty, posterior surface of the patella down to bleeding bone where necessary right knee. 2.  Arthroscopic lateral retinacular release, right knee. 3.  Arthroscopic debridement of medial and lateral plica right knee.  SURGEON:  Harvie Junior, MD.  ASSISTANT:  Gus Puma, PA-C who was present for the entire case and assisted by retraction of tissues, manipulation of the leg, closing to minimize or time.  INDICATIONS:  The patient is a 52 year old female with a long history of significant pain in bilateral knees.  We have done a left knee arthroscopy with lateral retinacular release and debridement and she had done well with that.  She continues to have  significant complaints of pain in her opposite knee and because of that continued complaints of pain, the patient was taken to the operating room for right knee arthroscopy.  Preoperative MRI had showed that there was no obvious meniscal pathology, but  that she did have significant chondromalacia of the patellofemoral joint.  DESCRIPTION OF PROCEDURE:  The patient was brought to the operating room.  After adequate anesthesia was obtained with a general anesthetic, the patient was placed supine on the operating table.  Right leg was prepped and draped in the usual sterile  fashion.  Following this, routine arthroscopic examination of the  right knee revealed there was no significant chondromalacia in the medial tibial plateau.  Medial meniscus was within normal limits.  ACL was within normal limits.  Attention was turned to  the lateral side where there was no significant meniscal or chondral pathology.  Attention turned back to the patellofemoral joint where there was slight lateral tracking of the patella with a tight lateral retinaculum was unable to get the cannula up  under the knee very easily.  Following this, attention was turned to the posterior surface of the patella, which had grade II, grade III and small areas of grade IV change.  Arthroscopic debrider was used to debride the area.  Once that was completed,  attention was turned to the lateral retinaculum where a lateral retinacular release was performed with an underwater Bovie.  The tissues were released completely down that lateral side and down to the joint line.  Patellar tracking was down the midline  and I could easily pass a cannula underneath the patellofemoral joint at this point.  At this time, a final check was made in the posterior surface of patella.  Final debridement was undertaken.  Attention turned back to the medial side where there was a  dense medial plica, which was debrided and there was a dense lateral plica, which was also debrided.  Also, free up the central tracking of the patella.  Once this was done, the knee was irrigated, suctioned dry.  A 20 mL of 0.25% Marcaine was instilled  in the knee for postoperative anesthesia.  Sterile compressive dressing was applied.  The patient was taken to recovery was noted to be in satisfactory condition.  Estimated blood loss for procedure  was minimal.   PUS D: 11/18/2022 4:57:13 pm T: 11/18/2022 6:49:00 pm  JOB: [id number]/ [id number]

## 2022-11-18 NOTE — Transfer of Care (Signed)
Immediate Anesthesia Transfer of Care Note  Patient: Virginia Hawkins  Procedure(s) Performed: RIGHT KNEE ARTHROSCOPY WITH CHONDROPLASTY PATELLA WITH LATERAL RETINACULAR RELEASE. MEDIAL PLICA INISION (Right: Knee)  Patient Location: PACU  Anesthesia Type:General  Level of Consciousness: awake, alert , and patient cooperative  Airway & Oxygen Therapy: Patient Spontanous Breathing and Patient connected to face mask oxygen  Post-op Assessment: Report given to RN and Post -op Vital signs reviewed and stable  Post vital signs: Reviewed and stable  Last Vitals:  Vitals Value Taken Time  BP 148/76 11/18/22 1408  Temp    Pulse 66 11/18/22 1408  Resp 12 11/18/22 1408  SpO2 100 % 11/18/22 1408  Vitals shown include unfiled device data.  Last Pain:  Vitals:   11/18/22 1149  TempSrc: Oral  PainSc:          Complications: No notable events documented.

## 2022-11-19 ENCOUNTER — Encounter (HOSPITAL_COMMUNITY): Payer: Self-pay | Admitting: Orthopedic Surgery

## 2022-11-20 ENCOUNTER — Other Ambulatory Visit (HOSPITAL_COMMUNITY): Payer: Self-pay

## 2022-11-20 MED ORDER — HYDROMORPHONE HCL 2 MG PO TABS
2.0000 mg | ORAL_TABLET | Freq: Four times a day (QID) | ORAL | 0 refills | Status: AC | PRN
Start: 2022-11-19 — End: ?
  Filled 2022-11-20: qty 20, 5d supply, fill #0

## 2022-11-20 NOTE — Anesthesia Postprocedure Evaluation (Signed)
Anesthesia Post Note  Patient: Charlesetta Goddard  Procedure(s) Performed: RIGHT KNEE ARTHROSCOPY WITH CHONDROPLASTY PATELLA WITH LATERAL RETINACULAR RELEASE. MEDIAL PLICA INISION (Right: Knee)     Patient location during evaluation: PACU Anesthesia Type: General Level of consciousness: awake Pain management: pain level controlled Vital Signs Assessment: post-procedure vital signs reviewed and stable Respiratory status: spontaneous breathing Cardiovascular status: stable Postop Assessment: no apparent nausea or vomiting Anesthetic complications: no  No notable events documented.  Last Vitals:  Vitals:   11/18/22 1515 11/18/22 1530  BP: (!) 159/83 (!) 167/78  Pulse: (!) 59 60  Resp: 12 14  Temp:  (!) 36.4 C  SpO2: 96% 100%    Last Pain:  Vitals:   11/18/22 1530  TempSrc:   PainSc: 5                  John F Tylin Force Jr

## 2022-11-26 DIAGNOSIS — R262 Difficulty in walking, not elsewhere classified: Secondary | ICD-10-CM | POA: Diagnosis not present

## 2022-11-26 DIAGNOSIS — M25661 Stiffness of right knee, not elsewhere classified: Secondary | ICD-10-CM | POA: Diagnosis not present

## 2022-11-26 DIAGNOSIS — M25461 Effusion, right knee: Secondary | ICD-10-CM | POA: Diagnosis not present

## 2022-11-26 DIAGNOSIS — Z9889 Other specified postprocedural states: Secondary | ICD-10-CM | POA: Diagnosis not present

## 2022-11-27 ENCOUNTER — Other Ambulatory Visit (HOSPITAL_COMMUNITY): Payer: Self-pay

## 2022-11-27 MED ORDER — MELOXICAM 15 MG PO TABS
15.0000 mg | ORAL_TABLET | Freq: Every day | ORAL | 1 refills | Status: AC
  Filled 2022-11-27: qty 30, 30d supply, fill #0

## 2022-12-03 DIAGNOSIS — R262 Difficulty in walking, not elsewhere classified: Secondary | ICD-10-CM | POA: Diagnosis not present

## 2022-12-03 DIAGNOSIS — M25661 Stiffness of right knee, not elsewhere classified: Secondary | ICD-10-CM | POA: Diagnosis not present

## 2022-12-09 DIAGNOSIS — M25661 Stiffness of right knee, not elsewhere classified: Secondary | ICD-10-CM | POA: Diagnosis not present

## 2022-12-09 DIAGNOSIS — R262 Difficulty in walking, not elsewhere classified: Secondary | ICD-10-CM | POA: Diagnosis not present

## 2022-12-10 DIAGNOSIS — M25661 Stiffness of right knee, not elsewhere classified: Secondary | ICD-10-CM | POA: Diagnosis not present

## 2022-12-24 ENCOUNTER — Other Ambulatory Visit (HOSPITAL_COMMUNITY): Payer: Self-pay

## 2022-12-24 MED ORDER — ETODOLAC 400 MG PO TABS
400.0000 mg | ORAL_TABLET | Freq: Two times a day (BID) | ORAL | 0 refills | Status: AC
Start: 2022-12-24 — End: ?
  Filled 2022-12-24: qty 60, 30d supply, fill #0

## 2022-12-25 ENCOUNTER — Other Ambulatory Visit (HOSPITAL_COMMUNITY): Payer: Self-pay

## 2022-12-25 DIAGNOSIS — Z01419 Encounter for gynecological examination (general) (routine) without abnormal findings: Secondary | ICD-10-CM | POA: Diagnosis not present

## 2022-12-25 MED ORDER — ZOLPIDEM TARTRATE 10 MG PO TABS
10.0000 mg | ORAL_TABLET | Freq: Every day | ORAL | 1 refills | Status: AC
Start: 2022-12-25 — End: ?
  Filled 2022-12-25: qty 30, 30d supply, fill #0
  Filled 2023-06-19: qty 30, 30d supply, fill #1

## 2022-12-25 MED ORDER — CONCEPT DHA 53.5-38-1 MG PO CAPS
1.0000 | ORAL_CAPSULE | Freq: Every day | ORAL | 4 refills | Status: AC
Start: 2022-12-25 — End: ?
  Filled 2022-12-25: qty 30, 30d supply, fill #0

## 2022-12-26 ENCOUNTER — Other Ambulatory Visit (HOSPITAL_COMMUNITY): Payer: Self-pay

## 2023-01-02 DIAGNOSIS — M25661 Stiffness of right knee, not elsewhere classified: Secondary | ICD-10-CM | POA: Diagnosis not present

## 2023-01-02 DIAGNOSIS — R262 Difficulty in walking, not elsewhere classified: Secondary | ICD-10-CM | POA: Diagnosis not present

## 2023-01-15 DIAGNOSIS — R262 Difficulty in walking, not elsewhere classified: Secondary | ICD-10-CM | POA: Diagnosis not present

## 2023-01-15 DIAGNOSIS — M25661 Stiffness of right knee, not elsewhere classified: Secondary | ICD-10-CM | POA: Diagnosis not present

## 2023-01-28 ENCOUNTER — Other Ambulatory Visit (HOSPITAL_COMMUNITY): Payer: Self-pay

## 2023-01-28 MED ORDER — METOPROLOL SUCCINATE ER 50 MG PO TB24
50.0000 mg | ORAL_TABLET | Freq: Every day | ORAL | 0 refills | Status: DC
  Filled 2023-01-28: qty 90, 90d supply, fill #0

## 2023-01-28 MED ORDER — GABAPENTIN 300 MG PO CAPS
300.0000 mg | ORAL_CAPSULE | Freq: Three times a day (TID) | ORAL | 0 refills | Status: AC | PRN
Start: 2023-01-28 — End: ?
  Filled 2023-01-28: qty 90, 30d supply, fill #0

## 2023-01-29 DIAGNOSIS — Z1231 Encounter for screening mammogram for malignant neoplasm of breast: Secondary | ICD-10-CM | POA: Diagnosis not present

## 2023-02-13 ENCOUNTER — Other Ambulatory Visit (HOSPITAL_COMMUNITY): Payer: Self-pay

## 2023-02-13 MED ORDER — AMITRIPTYLINE HCL 25 MG PO TABS
25.0000 mg | ORAL_TABLET | Freq: Every day | ORAL | 0 refills | Status: AC
  Filled 2023-02-13: qty 30, 30d supply, fill #0

## 2023-02-25 ENCOUNTER — Other Ambulatory Visit (HOSPITAL_COMMUNITY): Payer: Self-pay

## 2023-04-01 ENCOUNTER — Other Ambulatory Visit (HOSPITAL_COMMUNITY): Payer: Self-pay

## 2023-04-01 DIAGNOSIS — M25561 Pain in right knee: Secondary | ICD-10-CM | POA: Diagnosis not present

## 2023-04-01 MED ORDER — DICLOFENAC SODIUM 75 MG PO TBEC
75.0000 mg | DELAYED_RELEASE_TABLET | Freq: Two times a day (BID) | ORAL | 0 refills | Status: AC
  Filled 2023-04-01: qty 60, 30d supply, fill #0

## 2023-04-29 ENCOUNTER — Other Ambulatory Visit (HOSPITAL_COMMUNITY): Payer: Self-pay

## 2023-04-30 ENCOUNTER — Other Ambulatory Visit (HOSPITAL_COMMUNITY): Payer: Self-pay

## 2023-04-30 MED ORDER — TRAZODONE HCL 50 MG PO TABS
50.0000 mg | ORAL_TABLET | Freq: Every day | ORAL | 0 refills | Status: DC
  Filled 2023-04-30: qty 30, 15d supply, fill #0

## 2023-04-30 MED ORDER — METOPROLOL SUCCINATE ER 50 MG PO TB24
50.0000 mg | ORAL_TABLET | Freq: Every day | ORAL | 0 refills | Status: DC
  Filled 2023-04-30: qty 15, 15d supply, fill #0

## 2023-05-05 ENCOUNTER — Other Ambulatory Visit (HOSPITAL_COMMUNITY): Payer: Self-pay

## 2023-05-12 ENCOUNTER — Other Ambulatory Visit (HOSPITAL_COMMUNITY): Payer: Self-pay

## 2023-05-12 DIAGNOSIS — I10 Essential (primary) hypertension: Secondary | ICD-10-CM | POA: Diagnosis not present

## 2023-05-12 DIAGNOSIS — G4700 Insomnia, unspecified: Secondary | ICD-10-CM | POA: Diagnosis not present

## 2023-05-12 DIAGNOSIS — M79604 Pain in right leg: Secondary | ICD-10-CM | POA: Diagnosis not present

## 2023-05-12 DIAGNOSIS — Z131 Encounter for screening for diabetes mellitus: Secondary | ICD-10-CM | POA: Diagnosis not present

## 2023-05-12 DIAGNOSIS — Z136 Encounter for screening for cardiovascular disorders: Secondary | ICD-10-CM | POA: Diagnosis not present

## 2023-05-12 DIAGNOSIS — Z1211 Encounter for screening for malignant neoplasm of colon: Secondary | ICD-10-CM | POA: Diagnosis not present

## 2023-05-12 DIAGNOSIS — Z0000 Encounter for general adult medical examination without abnormal findings: Secondary | ICD-10-CM | POA: Diagnosis not present

## 2023-05-12 DIAGNOSIS — Z23 Encounter for immunization: Secondary | ICD-10-CM | POA: Diagnosis not present

## 2023-05-12 MED ORDER — METOPROLOL SUCCINATE ER 50 MG PO TB24
50.0000 mg | ORAL_TABLET | Freq: Every day | ORAL | 3 refills | Status: AC
  Filled 2023-05-12: qty 90, 90d supply, fill #0
  Filled 2023-09-07: qty 90, 90d supply, fill #1
  Filled 2023-12-03: qty 90, 90d supply, fill #2
  Filled 2024-03-05: qty 90, 90d supply, fill #3

## 2023-05-12 MED ORDER — TRAZODONE HCL 50 MG PO TABS
50.0000 mg | ORAL_TABLET | Freq: Every day | ORAL | 0 refills | Status: DC
  Filled 2023-05-12: qty 90, 45d supply, fill #0

## 2023-05-12 MED ORDER — LOSARTAN POTASSIUM 100 MG PO TABS
100.0000 mg | ORAL_TABLET | Freq: Every day | ORAL | 3 refills | Status: AC
  Filled 2023-05-12 – 2023-05-13 (×2): qty 90, 90d supply, fill #0
  Filled 2023-09-07: qty 90, 90d supply, fill #1
  Filled 2023-12-03: qty 90, 90d supply, fill #2
  Filled 2024-03-05: qty 90, 90d supply, fill #3

## 2023-05-12 MED ORDER — FLUTICASONE PROPIONATE 50 MCG/ACT NA SUSP
2.0000 | Freq: Every day | NASAL | 2 refills | Status: AC
  Filled 2023-05-12: qty 16, 30d supply, fill #0

## 2023-05-12 MED ORDER — HYDROCHLOROTHIAZIDE 12.5 MG PO TABS
6.2500 mg | ORAL_TABLET | Freq: Every day | ORAL | 5 refills | Status: AC
  Filled 2023-05-12: qty 15, 30d supply, fill #0
  Filled 2023-06-02: qty 15, 30d supply, fill #1

## 2023-05-12 MED ORDER — GABAPENTIN 600 MG PO TABS
600.0000 mg | ORAL_TABLET | Freq: Every evening | ORAL | 3 refills | Status: AC | PRN
  Filled 2023-05-12: qty 90, 90d supply, fill #0

## 2023-05-13 ENCOUNTER — Other Ambulatory Visit: Payer: Self-pay

## 2023-05-13 ENCOUNTER — Other Ambulatory Visit (HOSPITAL_COMMUNITY): Payer: Self-pay

## 2023-06-02 ENCOUNTER — Other Ambulatory Visit (HOSPITAL_COMMUNITY): Payer: Self-pay

## 2023-06-09 ENCOUNTER — Other Ambulatory Visit (HOSPITAL_COMMUNITY): Payer: Self-pay

## 2023-06-09 DIAGNOSIS — I10 Essential (primary) hypertension: Secondary | ICD-10-CM | POA: Diagnosis not present

## 2023-06-09 MED ORDER — HYDROCHLOROTHIAZIDE 12.5 MG PO TABS
12.5000 mg | ORAL_TABLET | Freq: Every day | ORAL | 3 refills | Status: AC
  Filled 2023-06-09 – 2023-06-10 (×2): qty 90, 90d supply, fill #0

## 2023-06-10 ENCOUNTER — Other Ambulatory Visit (HOSPITAL_COMMUNITY): Payer: Self-pay

## 2023-06-13 ENCOUNTER — Other Ambulatory Visit (HOSPITAL_COMMUNITY): Payer: Self-pay

## 2023-06-19 ENCOUNTER — Other Ambulatory Visit: Payer: Self-pay

## 2023-07-17 ENCOUNTER — Other Ambulatory Visit: Payer: Self-pay

## 2023-07-17 ENCOUNTER — Other Ambulatory Visit (HOSPITAL_COMMUNITY): Payer: Self-pay

## 2023-07-17 MED ORDER — IBUPROFEN 800 MG PO TABS
800.0000 mg | ORAL_TABLET | Freq: Three times a day (TID) | ORAL | 3 refills | Status: AC | PRN
  Filled 2023-07-17: qty 30, 10d supply, fill #0
  Filled 2023-12-03: qty 30, 10d supply, fill #1

## 2023-07-17 MED ORDER — ESTRADIOL 0.1 MG/GM VA CREA
0.5000 g | TOPICAL_CREAM | Freq: Every day | VAGINAL | 3 refills | Status: AC
  Filled 2023-07-17: qty 42.5, 85d supply, fill #0

## 2023-07-31 DIAGNOSIS — Z1211 Encounter for screening for malignant neoplasm of colon: Secondary | ICD-10-CM | POA: Diagnosis not present

## 2023-07-31 DIAGNOSIS — K648 Other hemorrhoids: Secondary | ICD-10-CM | POA: Diagnosis not present

## 2023-08-11 DIAGNOSIS — R102 Pelvic and perineal pain: Secondary | ICD-10-CM | POA: Diagnosis not present

## 2023-08-14 DIAGNOSIS — N858 Other specified noninflammatory disorders of uterus: Secondary | ICD-10-CM | POA: Diagnosis not present

## 2023-08-28 ENCOUNTER — Ambulatory Visit: Admitting: Podiatry

## 2023-09-18 ENCOUNTER — Ambulatory Visit (INDEPENDENT_AMBULATORY_CARE_PROVIDER_SITE_OTHER)

## 2023-09-18 ENCOUNTER — Other Ambulatory Visit (HOSPITAL_COMMUNITY): Payer: Self-pay

## 2023-09-18 ENCOUNTER — Ambulatory Visit: Admitting: Podiatry

## 2023-09-18 DIAGNOSIS — M7752 Other enthesopathy of left foot: Secondary | ICD-10-CM

## 2023-09-18 DIAGNOSIS — Q666 Other congenital valgus deformities of feet: Secondary | ICD-10-CM

## 2023-09-18 DIAGNOSIS — M25372 Other instability, left ankle: Secondary | ICD-10-CM | POA: Diagnosis not present

## 2023-09-18 MED ORDER — CYCLOBENZAPRINE HCL 10 MG PO TABS
10.0000 mg | ORAL_TABLET | Freq: Three times a day (TID) | ORAL | 0 refills | Status: AC | PRN
  Filled 2023-09-18: qty 30, 10d supply, fill #0

## 2023-09-18 NOTE — Progress Notes (Signed)
 Subjective: Chief Complaint  Patient presents with   Foot Pain    RM#12 Left foot pain worsening not getting any better.   53 year old female presents the office today with above concerns.  She states she is getting pain again to her left foot and into the ankle.  Close with the arch of the foot but she also describes discomfort to the outside, lateral aspect of the ankle.  She does not recall any injuries.  She has been doing some home stretching and trying to continue with supportive shoe gear.  She also describes a cramping in her toes drawing up sensation.  She does have a history of her left ankle giving out but no recent injuries.   Objective: AAO x3, NAD DP/PT pulses palpable bilaterally, CRT less than 3 seconds The majority of tenderness is to the arch of the foot on the medial band plantar fascia and mildly at the insertion of the calcaneus.  There is no pain with lateral compression of calcaneus.  There is no area of pinpoint tenderness identified at this time.  She does get some tenderness on the lateral aspect ankle.  There is a mild increase in anterior drawer. No significant edema. No pain with calf compression, swelling, warmth, erythema  Assessment: Ankle instability, plantar fasciitis/pes planovalgus  Plan: -All treatment options discussed with the patient including all alternatives, risks, complications.  -We discussed various treatment options including both surgical and conservative treatments.  We also discussed advanced imaging including MRI. -Return to physical therapy and referral placed. -Prescribed Flexeril  to take as needed -Discussed continuing with supportive shoegear -She is going to redo her intermittent FMLA- she will contact the office once she has the paperwork.  -Patient encouraged to call the office with any questions, concerns, change in symptoms.   Virginia Hawkins DPM

## 2023-09-22 ENCOUNTER — Telehealth: Payer: Self-pay | Admitting: Podiatry

## 2023-09-22 DIAGNOSIS — Z0271 Encounter for disability determination: Secondary | ICD-10-CM

## 2023-09-22 NOTE — Telephone Encounter (Signed)
 s/w pt and was advised of FMLA intermittent 2-3 days per week/8hours 09/18/23-09/17/24 as needed. faxed to [employer] [phone] and emailed pt her copy [email or web address]

## 2023-10-09 ENCOUNTER — Other Ambulatory Visit (HOSPITAL_COMMUNITY): Payer: Self-pay

## 2023-11-20 ENCOUNTER — Ambulatory Visit: Admitting: Podiatry

## 2023-12-03 ENCOUNTER — Other Ambulatory Visit (HOSPITAL_COMMUNITY): Payer: Self-pay

## 2023-12-03 ENCOUNTER — Other Ambulatory Visit: Payer: Self-pay

## 2023-12-06 ENCOUNTER — Other Ambulatory Visit (HOSPITAL_COMMUNITY): Payer: Self-pay

## 2023-12-06 MED ORDER — TRAZODONE HCL 50 MG PO TABS
50.0000 mg | ORAL_TABLET | Freq: Every evening | ORAL | 2 refills | Status: AC
  Filled 2023-12-06: qty 100, 50d supply, fill #0

## 2023-12-08 ENCOUNTER — Other Ambulatory Visit (HOSPITAL_COMMUNITY): Payer: Self-pay

## 2023-12-08 MED ORDER — ZOLPIDEM TARTRATE 10 MG PO TABS
10.0000 mg | ORAL_TABLET | Freq: Every evening | ORAL | 1 refills | Status: AC | PRN
  Filled 2023-12-08: qty 30, 30d supply, fill #0

## 2023-12-08 NOTE — Therapy (Incomplete)
 OUTPATIENT PHYSICAL THERAPY LOWER EXTREMITY EVALUATION   Patient Name: Virginia Hawkins MRN: [id number] DOB:[birth year], 53 y.o., female Today's Date: 12/08/2023  END OF SESSION:   Past Medical History:  Diagnosis Date   Complication of anesthesia    Fibromyalgia    Heart murmur    Hypertension    Ovarian cyst    PONV (postoperative nausea and vomiting)    Vaginal Pap smear, abnormal    Past Surgical History:  Procedure Laterality Date   ARTHROSCOPY WITH ANTERIOR CRUCIATE LIGAMENT (ACL) REPAIR WITH ANTERIOR TIBILIAS GRAFT Left    CYST EXCISION     FOOT SURGERY Left 2020   for plantar fasciitis   KNEE ARTHROSCOPY Right 11/18/2022   Procedure: RIGHT KNEE ARTHROSCOPY WITH CHONDROPLASTY PATELLA WITH LATERAL RETINACULAR RELEASE. 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  Rationale for Evaluation and Treatment: Rehabilitation  ONSET DATE: ***  SUBJECTIVE:   SUBJECTIVE STATEMENT: ***  PERTINENT HISTORY: *** PAIN:  Are you having pain? Yes: NPRS scale: *** Pain location: *** Pain description: *** Aggravating factors: *** Relieving factors: ***  PRECAUTIONS: {Therapy precautions:24002}  RED FLAGS: {PT Red Flags:29287}   WEIGHT  BEARING RESTRICTIONS: {Yes ***/No:24003}  FALLS:  Has patient fallen in last 6 months? {fallsyesno:27318}  LIVING ENVIRONMENT: Lives with: {[facility] lives with:25569::lives with their family} Lives in: {Lives in:25570} Stairs: {opstairs:27293} Has following equipment at home: {Assistive devices:23999}  OCCUPATION: ***  PLOF: {PLOF:24004}  PATIENT GOALS: ***  NEXT MD VISIT: ***  OBJECTIVE:  Note: Objective measures were completed at Evaluation unless otherwise noted.  DIAGNOSTIC FINDINGS:   PATIENT SURVEYS:  {rehab surveys:24030}  COGNITION: Overall cognitive status: {cognition:24006}     SENSATION: {sensation:27233}  EDEMA:  {edema:24020}  MUSCLE LENGTH: Hamstrings: Right *** deg; Left *** deg Debby test: Right *** deg; Left *** deg  POSTURE: {posture:25561}  PALPATION: ***  LOWER EXTREMITY ROM:  {AROM/PROM:27142} ROM Right eval Left eval  Hip flexion    Hip extension    Hip abduction    Hip adduction    Hip internal rotation    Hip external rotation    Knee flexion    Knee extension    Ankle dorsiflexion    Ankle plantarflexion    Ankle inversion    Ankle eversion     (Blank rows = not tested)  LOWER EXTREMITY MMT:  MMT Right eval Left eval  Hip flexion    Hip extension    Hip abduction    Hip adduction    Hip internal rotation    Hip external rotation    Knee flexion    Knee extension    Ankle dorsiflexion    Ankle plantarflexion    Ankle inversion    Ankle eversion     (Blank rows =  not tested)  LOWER EXTREMITY SPECIAL TESTS:  {LEspecialtests:26242}  FUNCTIONAL TESTS:  {Functional tests:24029}  GAIT: Distance walked: *** Assistive device utilized: {Assistive devices:23999} Level of assistance: {Levels of assistance:24026} Comments: ***                                                                                                                                TREATMENT DATE: ***    PATIENT EDUCATION:  Education  details: *** Person educated: {Person educated:25204} Education method: {Education Method:25205} Education comprehension: {Education Comprehension:25206}  HOME EXERCISE PROGRAM: ***  ASSESSMENT:  CLINICAL IMPRESSION: Patient is a 53 y.o. female who was seen today for physical therapy evaluation and treatment for  M25.372 (ICD-10-CM) - Ankle instability, left  Q66.6 (ICD-10-CM) - Pes planovalgus    OBJECTIVE IMPAIRMENTS: {opptimpairments:25111}.   ACTIVITY LIMITATIONS: {activitylimitations:27494}  PARTICIPATION LIMITATIONS: {participationrestrictions:25113}  PERSONAL FACTORS: {Personal factors:25162} are also affecting patient's functional outcome.   REHAB POTENTIAL: {rehabpotential:25112}  CLINICAL DECISION MAKING: {clinical decision making:25114}  EVALUATION COMPLEXITY: {Evaluation complexity:25115}   GOALS:   SHORT TERM GOALS: Target date: *** *** Baseline: Goal status: INITIAL  2.  *** Baseline:  Goal status: INITIAL  3.  *** Baseline:  Goal status: INITIAL  4.  *** Baseline:  Goal status: INITIAL  5.  *** Baseline:  Goal status: INITIAL  6.  *** Baseline:  Goal status: INITIAL  LONG TERM GOALS: Target date: ***  *** Baseline:  Goal status: INITIAL  2.  *** Baseline:  Goal status: INITIAL  3.  *** Baseline:  Goal status: INITIAL  4.  *** Baseline:  Goal status: INITIAL  5.  *** Baseline:  Goal status: INITIAL  6.  *** Baseline:  Goal status: INITIAL   PLAN:  PT FREQUENCY: {rehab frequency:25116}  PT DURATION: {rehab duration:25117}  PLANNED INTERVENTIONS: {rehab planned interventions:25118::97110-Therapeutic exercises,97530- Therapeutic [phone]- Neuromuscular re-education,97535- Self Rjmz,02859- Manual therapy,Patient/Family education}  PLAN FOR NEXT SESSION: ***   Jahnai Slingerland, PT 12/08/2023, 9:12 PM

## 2023-12-09 ENCOUNTER — Encounter (HOSPITAL_COMMUNITY): Payer: Self-pay

## 2023-12-09 ENCOUNTER — Other Ambulatory Visit (HOSPITAL_COMMUNITY): Payer: Self-pay

## 2023-12-10 ENCOUNTER — Ambulatory Visit: Attending: Podiatry

## 2023-12-25 ENCOUNTER — Ambulatory Visit: Admitting: Podiatry

## 2023-12-30 ENCOUNTER — Other Ambulatory Visit (HOSPITAL_COMMUNITY): Payer: Self-pay

## 2023-12-30 MED ORDER — VALACYCLOVIR HCL 1 G PO TABS
2.0000 g | ORAL_TABLET | Freq: Two times a day (BID) | ORAL | 4 refills | Status: AC
  Filled 2023-12-30: qty 4, 1d supply, fill #0
  Filled 2024-01-27: qty 4, 1d supply, fill #1

## 2023-12-31 ENCOUNTER — Other Ambulatory Visit (HOSPITAL_COMMUNITY): Payer: Self-pay

## 2024-01-26 ENCOUNTER — Ambulatory Visit

## 2024-01-26 ENCOUNTER — Ambulatory Visit: Admitting: Podiatry

## 2024-01-26 ENCOUNTER — Other Ambulatory Visit: Payer: Self-pay | Admitting: Podiatry

## 2024-01-26 DIAGNOSIS — M25372 Other instability, left ankle: Secondary | ICD-10-CM

## 2024-01-26 DIAGNOSIS — Q666 Other congenital valgus deformities of feet: Secondary | ICD-10-CM

## 2024-01-26 DIAGNOSIS — M7672 Peroneal tendinitis, left leg: Secondary | ICD-10-CM

## 2024-01-27 ENCOUNTER — Encounter: Payer: Self-pay | Admitting: Podiatry

## 2024-01-27 ENCOUNTER — Other Ambulatory Visit (HOSPITAL_COMMUNITY): Payer: Self-pay

## 2024-01-27 NOTE — Progress Notes (Signed)
 Subjective: Chief Complaint  Patient presents with   Foot Pain    Pt stated that she still has some discomfort in her foot she stated that it all depends on the day    53 year old female presents the office today with above concerns.  She states that she is still dealing with the same issues and having ongoing discomfort.  She points more to the arch of the foot where she gets discomfort.  She states that the pain is variable and some days she is doing well and other days she has pain.  She does not report any recent injuries.    Objective: AAO x3, NAD DP/PT pulses palpable bilaterally, CRT less than 3 seconds The majority of tenderness is to the arch of the foot on the medial band plantar fascia and somewhat along the insertion of the calcaneus.  There is no pain with lateral compression of calcaneus.  There is no area of pinpoint tenderness identified at this time.  She does get some tenderness on the lateral aspect ankle.  There is a mild increase in anterior drawer. No significant edema.  There is no pain on the course of the posterior tibial tendon.  Not able to appreciate any area pinpoint tenderness. No pain with calf compression, swelling, warmth, erythema  Assessment: Ankle instability, plantar fasciitis/pes planovalgus  Plan: -All treatment options discussed with the patient including all alternatives, risks, complications.  -Discussed various treatment options.  We discussed on several occasions possible further surgical intervention as well given her ongoing discomfort to me to consider this.  She is not ready to proceed with that at this time and may be after the first of the year.  I have ordered an MRI of the left ankle to reevaluate as it has been a couple of years since she has had this done.  We also discussed nerve conduction test although I do not think that she has got any nerve issues.  This was also completed in 2018 and was normal at that time. - For now she has been  continue with home exercises, good support.  Offered different medications but she states has not provided relief in the past.  Will get the results of the MRI and further decide treatment options.  I like to have her follow-up with one of the other providers in the office for further surgical intervention as I will likely be a more complex or invasive surgery.  Return for MRI results.  Donnice JONELLE Fees DPM

## 2024-02-07 ENCOUNTER — Ambulatory Visit
Admission: RE | Admit: 2024-02-07 | Discharge: 2024-02-07 | Disposition: A | Discharge status: Home / Self Care | Source: Ambulatory Visit | Attending: Podiatry | Admitting: Podiatry

## 2024-02-07 DIAGNOSIS — M25372 Other instability, left ankle: Secondary | ICD-10-CM

## 2024-02-07 DIAGNOSIS — M19072 Primary osteoarthritis, left ankle and foot: Secondary | ICD-10-CM | POA: Diagnosis not present

## 2024-02-09 ENCOUNTER — Ambulatory Visit: Payer: Self-pay | Admitting: Podiatry

## 2024-02-23 ENCOUNTER — Ambulatory Visit: Admitting: Podiatry

## 2024-03-08 ENCOUNTER — Other Ambulatory Visit (HOSPITAL_COMMUNITY): Payer: Self-pay
